# Patient Record
Sex: Female | Born: 1950 | Race: White | Hispanic: No | Marital: Married | Smoking: Never smoker
Health system: Southern US, Community
[De-identification: ages and names within clinical notes are randomized; demographics above are authoritative.]

## PROBLEM LIST (undated history)

## (undated) DIAGNOSIS — G4700 Insomnia, unspecified: Secondary | ICD-10-CM

## (undated) DIAGNOSIS — I776 Arteritis, unspecified: Secondary | ICD-10-CM

## (undated) DIAGNOSIS — K219 Gastro-esophageal reflux disease without esophagitis: Secondary | ICD-10-CM

## (undated) DIAGNOSIS — E079 Disorder of thyroid, unspecified: Secondary | ICD-10-CM

## (undated) DIAGNOSIS — R569 Unspecified convulsions: Secondary | ICD-10-CM

## (undated) DIAGNOSIS — I10 Essential (primary) hypertension: Secondary | ICD-10-CM

## (undated) DIAGNOSIS — A0472 Enterocolitis due to Clostridium difficile, not specified as recurrent: Secondary | ICD-10-CM

## (undated) DIAGNOSIS — E785 Hyperlipidemia, unspecified: Secondary | ICD-10-CM

## (undated) DIAGNOSIS — I4891 Unspecified atrial fibrillation: Secondary | ICD-10-CM

---

## 2021-01-09 ENCOUNTER — Emergency Department: Payer: Medicare HMO

## 2021-01-09 ENCOUNTER — Inpatient Hospital Stay: Payer: Medicare HMO

## 2021-01-09 ENCOUNTER — Inpatient Hospital Stay
Admission: EM | Admit: 2021-01-09 | Discharge: 2021-02-09 | DRG: 871 | Disposition: E | Payer: Medicare HMO | Attending: Pulmonary Disease | Admitting: Pulmonary Disease

## 2021-01-09 ENCOUNTER — Other Ambulatory Visit: Payer: Self-pay

## 2021-01-09 ENCOUNTER — Encounter: Payer: Self-pay | Admitting: Emergency Medicine

## 2021-01-09 DIAGNOSIS — R634 Abnormal weight loss: Secondary | ICD-10-CM | POA: Diagnosis present

## 2021-01-09 DIAGNOSIS — Z9889 Other specified postprocedural states: Secondary | ICD-10-CM

## 2021-01-09 DIAGNOSIS — R0989 Other specified symptoms and signs involving the circulatory and respiratory systems: Secondary | ICD-10-CM | POA: Diagnosis not present

## 2021-01-09 DIAGNOSIS — D649 Anemia, unspecified: Secondary | ICD-10-CM | POA: Diagnosis not present

## 2021-01-09 DIAGNOSIS — E878 Other disorders of electrolyte and fluid balance, not elsewhere classified: Secondary | ICD-10-CM | POA: Diagnosis not present

## 2021-01-09 DIAGNOSIS — R197 Diarrhea, unspecified: Secondary | ICD-10-CM | POA: Diagnosis not present

## 2021-01-09 DIAGNOSIS — E871 Hypo-osmolality and hyponatremia: Secondary | ICD-10-CM | POA: Diagnosis present

## 2021-01-09 DIAGNOSIS — I482 Chronic atrial fibrillation, unspecified: Secondary | ICD-10-CM | POA: Diagnosis present

## 2021-01-09 DIAGNOSIS — G9341 Metabolic encephalopathy: Secondary | ICD-10-CM | POA: Diagnosis present

## 2021-01-09 DIAGNOSIS — E8779 Other fluid overload: Secondary | ICD-10-CM | POA: Diagnosis not present

## 2021-01-09 DIAGNOSIS — Z882 Allergy status to sulfonamides status: Secondary | ICD-10-CM

## 2021-01-09 DIAGNOSIS — D61818 Other pancytopenia: Secondary | ICD-10-CM | POA: Diagnosis not present

## 2021-01-09 DIAGNOSIS — Z20822 Contact with and (suspected) exposure to covid-19: Secondary | ICD-10-CM | POA: Diagnosis present

## 2021-01-09 DIAGNOSIS — R6521 Severe sepsis with septic shock: Secondary | ICD-10-CM | POA: Diagnosis present

## 2021-01-09 DIAGNOSIS — Z79890 Hormone replacement therapy (postmenopausal): Secondary | ICD-10-CM

## 2021-01-09 DIAGNOSIS — A4189 Other specified sepsis: Secondary | ICD-10-CM | POA: Diagnosis not present

## 2021-01-09 DIAGNOSIS — T460X5A Adverse effect of cardiac-stimulant glycosides and drugs of similar action, initial encounter: Secondary | ICD-10-CM | POA: Diagnosis present

## 2021-01-09 DIAGNOSIS — N170 Acute kidney failure with tubular necrosis: Secondary | ICD-10-CM | POA: Diagnosis present

## 2021-01-09 DIAGNOSIS — R52 Pain, unspecified: Secondary | ICD-10-CM | POA: Diagnosis not present

## 2021-01-09 DIAGNOSIS — K449 Diaphragmatic hernia without obstruction or gangrene: Secondary | ICD-10-CM | POA: Diagnosis present

## 2021-01-09 DIAGNOSIS — Z6820 Body mass index (BMI) 20.0-20.9, adult: Secondary | ICD-10-CM

## 2021-01-09 DIAGNOSIS — I959 Hypotension, unspecified: Secondary | ICD-10-CM

## 2021-01-09 DIAGNOSIS — T460X1A Poisoning by cardiac-stimulant glycosides and drugs of similar action, accidental (unintentional), initial encounter: Secondary | ICD-10-CM | POA: Diagnosis not present

## 2021-01-09 DIAGNOSIS — J90 Pleural effusion, not elsewhere classified: Secondary | ICD-10-CM | POA: Diagnosis not present

## 2021-01-09 DIAGNOSIS — J9601 Acute respiratory failure with hypoxia: Secondary | ICD-10-CM | POA: Diagnosis not present

## 2021-01-09 DIAGNOSIS — N08 Glomerular disorders in diseases classified elsewhere: Secondary | ICD-10-CM | POA: Diagnosis present

## 2021-01-09 DIAGNOSIS — Z888 Allergy status to other drugs, medicaments and biological substances status: Secondary | ICD-10-CM

## 2021-01-09 DIAGNOSIS — R571 Hypovolemic shock: Secondary | ICD-10-CM | POA: Diagnosis present

## 2021-01-09 DIAGNOSIS — A419 Sepsis, unspecified organism: Secondary | ICD-10-CM | POA: Diagnosis not present

## 2021-01-09 DIAGNOSIS — N189 Chronic kidney disease, unspecified: Secondary | ICD-10-CM | POA: Diagnosis present

## 2021-01-09 DIAGNOSIS — E039 Hypothyroidism, unspecified: Secondary | ICD-10-CM | POA: Diagnosis present

## 2021-01-09 DIAGNOSIS — R531 Weakness: Secondary | ICD-10-CM | POA: Diagnosis present

## 2021-01-09 DIAGNOSIS — D696 Thrombocytopenia, unspecified: Secondary | ICD-10-CM | POA: Diagnosis not present

## 2021-01-09 DIAGNOSIS — J918 Pleural effusion in other conditions classified elsewhere: Secondary | ICD-10-CM | POA: Diagnosis present

## 2021-01-09 DIAGNOSIS — D709 Neutropenia, unspecified: Secondary | ICD-10-CM | POA: Diagnosis not present

## 2021-01-09 DIAGNOSIS — E162 Hypoglycemia, unspecified: Secondary | ICD-10-CM | POA: Diagnosis present

## 2021-01-09 DIAGNOSIS — K51018 Ulcerative (chronic) pancolitis with other complication: Secondary | ICD-10-CM | POA: Diagnosis not present

## 2021-01-09 DIAGNOSIS — Z515 Encounter for palliative care: Secondary | ICD-10-CM | POA: Diagnosis not present

## 2021-01-09 DIAGNOSIS — A414 Sepsis due to anaerobes: Secondary | ICD-10-CM | POA: Diagnosis present

## 2021-01-09 DIAGNOSIS — K529 Noninfective gastroenteritis and colitis, unspecified: Secondary | ICD-10-CM | POA: Diagnosis not present

## 2021-01-09 DIAGNOSIS — D631 Anemia in chronic kidney disease: Secondary | ICD-10-CM | POA: Diagnosis present

## 2021-01-09 DIAGNOSIS — I4891 Unspecified atrial fibrillation: Secondary | ICD-10-CM | POA: Diagnosis present

## 2021-01-09 DIAGNOSIS — R748 Abnormal levels of other serum enzymes: Secondary | ICD-10-CM | POA: Diagnosis present

## 2021-01-09 DIAGNOSIS — E860 Dehydration: Secondary | ICD-10-CM

## 2021-01-09 DIAGNOSIS — R0602 Shortness of breath: Secondary | ICD-10-CM

## 2021-01-09 DIAGNOSIS — A0471 Enterocolitis due to Clostridium difficile, recurrent: Secondary | ICD-10-CM | POA: Diagnosis present

## 2021-01-09 DIAGNOSIS — I7782 Antineutrophilic cytoplasmic antibody (ANCA) vasculitis: Secondary | ICD-10-CM | POA: Diagnosis not present

## 2021-01-09 DIAGNOSIS — A0472 Enterocolitis due to Clostridium difficile, not specified as recurrent: Secondary | ICD-10-CM | POA: Diagnosis present

## 2021-01-09 DIAGNOSIS — N1832 Chronic kidney disease, stage 3b: Secondary | ICD-10-CM | POA: Diagnosis present

## 2021-01-09 DIAGNOSIS — Z66 Do not resuscitate: Secondary | ICD-10-CM | POA: Diagnosis not present

## 2021-01-09 DIAGNOSIS — E876 Hypokalemia: Secondary | ICD-10-CM | POA: Diagnosis present

## 2021-01-09 DIAGNOSIS — I129 Hypertensive chronic kidney disease with stage 1 through stage 4 chronic kidney disease, or unspecified chronic kidney disease: Secondary | ICD-10-CM | POA: Diagnosis present

## 2021-01-09 DIAGNOSIS — R188 Other ascites: Secondary | ICD-10-CM | POA: Diagnosis present

## 2021-01-09 DIAGNOSIS — J811 Chronic pulmonary edema: Secondary | ICD-10-CM | POA: Diagnosis not present

## 2021-01-09 DIAGNOSIS — Z91011 Allergy to milk products: Secondary | ICD-10-CM

## 2021-01-09 DIAGNOSIS — R0902 Hypoxemia: Secondary | ICD-10-CM | POA: Diagnosis not present

## 2021-01-09 DIAGNOSIS — E785 Hyperlipidemia, unspecified: Secondary | ICD-10-CM | POA: Diagnosis present

## 2021-01-09 DIAGNOSIS — J9602 Acute respiratory failure with hypercapnia: Secondary | ICD-10-CM | POA: Diagnosis not present

## 2021-01-09 DIAGNOSIS — Z79899 Other long term (current) drug therapy: Secondary | ICD-10-CM

## 2021-01-09 DIAGNOSIS — R109 Unspecified abdominal pain: Secondary | ICD-10-CM | POA: Diagnosis not present

## 2021-01-09 DIAGNOSIS — K219 Gastro-esophageal reflux disease without esophagitis: Secondary | ICD-10-CM | POA: Diagnosis present

## 2021-01-09 DIAGNOSIS — Z8619 Personal history of other infectious and parasitic diseases: Secondary | ICD-10-CM

## 2021-01-09 DIAGNOSIS — Z90710 Acquired absence of both cervix and uterus: Secondary | ICD-10-CM

## 2021-01-09 DIAGNOSIS — J189 Pneumonia, unspecified organism: Secondary | ICD-10-CM

## 2021-01-09 DIAGNOSIS — Z8701 Personal history of pneumonia (recurrent): Secondary | ICD-10-CM

## 2021-01-09 HISTORY — DX: Hyperlipidemia, unspecified: E78.5

## 2021-01-09 HISTORY — DX: Gastro-esophageal reflux disease without esophagitis: K21.9

## 2021-01-09 HISTORY — DX: Insomnia, unspecified: G47.00

## 2021-01-09 HISTORY — DX: Arteritis, unspecified: I77.6

## 2021-01-09 HISTORY — DX: Unspecified convulsions: R56.9

## 2021-01-09 HISTORY — DX: Enterocolitis due to Clostridium difficile, not specified as recurrent: A04.72

## 2021-01-09 HISTORY — DX: Unspecified atrial fibrillation: I48.91

## 2021-01-09 HISTORY — DX: Essential (primary) hypertension: I10

## 2021-01-09 HISTORY — DX: Disorder of thyroid, unspecified: E07.9

## 2021-01-09 LAB — CBC WITH DIFFERENTIAL/PLATELET
Abs Immature Granulocytes: 0.01 10*3/uL (ref 0.00–0.07)
Basophils Absolute: 0 10*3/uL (ref 0.0–0.1)
Basophils Relative: 2 %
Eosinophils Absolute: 0 10*3/uL (ref 0.0–0.5)
Eosinophils Relative: 2 %
HCT: 30.1 % — ABNORMAL LOW (ref 36.0–46.0)
Hemoglobin: 10.2 g/dL — ABNORMAL LOW (ref 12.0–15.0)
Immature Granulocytes: 1 %
Lymphocytes Relative: 20 %
Lymphs Abs: 0.2 10*3/uL — ABNORMAL LOW (ref 0.7–4.0)
MCH: 28 pg (ref 26.0–34.0)
MCHC: 33.9 g/dL (ref 30.0–36.0)
MCV: 82.7 fL (ref 80.0–100.0)
Monocytes Absolute: 0.4 10*3/uL (ref 0.1–1.0)
Monocytes Relative: 38 %
Neutro Abs: 0.4 10*3/uL — CL (ref 1.7–7.7)
Neutrophils Relative %: 37 %
Platelets: 179 10*3/uL (ref 150–400)
RBC: 3.64 MIL/uL — ABNORMAL LOW (ref 3.87–5.11)
RDW: 19.5 % — ABNORMAL HIGH (ref 11.5–15.5)
Smear Review: NORMAL
WBC: 1.1 10*3/uL — CL (ref 4.0–10.5)
nRBC: 0 % (ref 0.0–0.2)

## 2021-01-09 LAB — GASTROINTESTINAL PANEL BY PCR, STOOL (REPLACES STOOL CULTURE)

## 2021-01-09 LAB — COMPREHENSIVE METABOLIC PANEL
ALT: 9 U/L (ref 0–44)
AST: 17 U/L (ref 15–41)
Albumin: 2 g/dL — ABNORMAL LOW (ref 3.5–5.0)
Alkaline Phosphatase: 79 U/L (ref 38–126)
Anion gap: 13 (ref 5–15)
BUN: 28 mg/dL — ABNORMAL HIGH (ref 8–23)
CO2: 14 mmol/L — ABNORMAL LOW (ref 22–32)
Calcium: 7.6 mg/dL — ABNORMAL LOW (ref 8.9–10.3)
Chloride: 101 mmol/L (ref 98–111)
Creatinine, Ser: 1.7 mg/dL — ABNORMAL HIGH (ref 0.44–1.00)
GFR, Estimated: 32 mL/min — ABNORMAL LOW (ref >60)
Glucose, Bld: 59 mg/dL — ABNORMAL LOW (ref 70–99)
Potassium: 3.3 mmol/L — ABNORMAL LOW (ref 3.5–5.1)
Sodium: 128 mmol/L — ABNORMAL LOW (ref 135–145)
Total Bilirubin: 1.2 mg/dL (ref 0.3–1.2)
Total Protein: 4.8 g/dL — ABNORMAL LOW (ref 6.5–8.1)

## 2021-01-09 LAB — BLOOD GAS, VENOUS
Acid-Base Excess: 0.6 mmol/L (ref 0.0–2.0)
Bicarbonate: 23.8 mmol/L (ref 20.0–28.0)
FIO2: 0.21
O2 Saturation: 85.9 %
Patient temperature: 37
pCO2, Ven: 32 mmHg — ABNORMAL LOW (ref 44.0–60.0)
pH, Ven: 7.48 — ABNORMAL HIGH (ref 7.250–7.430)
pO2, Ven: 47 mmHg — ABNORMAL HIGH (ref 32.0–45.0)

## 2021-01-09 LAB — C DIFFICILE QUICK SCREEN W PCR REFLEX
C Diff antigen: POSITIVE — AB
C Diff interpretation: DETECTED
C Diff toxin: POSITIVE — AB

## 2021-01-09 LAB — RESP PANEL BY RT-PCR (FLU A&B, COVID) ARPGX2
Influenza A by PCR: NEGATIVE
Influenza B by PCR: NEGATIVE
SARS Coronavirus 2 by RT PCR: NEGATIVE

## 2021-01-09 LAB — HEMOGLOBIN AND HEMATOCRIT, BLOOD
HCT: 26.2 % — ABNORMAL LOW (ref 36.0–46.0)
Hemoglobin: 9.3 g/dL — ABNORMAL LOW (ref 12.0–15.0)

## 2021-01-09 LAB — PROCALCITONIN: Procalcitonin: 94.99 ng/mL

## 2021-01-09 LAB — BLOOD GAS, ARTERIAL
Acid-base deficit: 9.3 mmol/L — ABNORMAL HIGH (ref 0.0–2.0)
Bicarbonate: 13.3 mmol/L — ABNORMAL LOW (ref 20.0–28.0)
FIO2: 0.21
O2 Saturation: 95.6 %
Patient temperature: 37
pCO2 arterial: 20 mmHg — ABNORMAL LOW (ref 32.0–48.0)
pH, Arterial: 7.43 (ref 7.350–7.450)
pO2, Arterial: 77 mmHg — ABNORMAL LOW (ref 83.0–108.0)

## 2021-01-09 LAB — CBG MONITORING, ED: Glucose-Capillary: 68 mg/dL — ABNORMAL LOW (ref 70–99)

## 2021-01-09 LAB — TSH: TSH: 1.872 u[IU]/mL (ref 0.350–4.500)

## 2021-01-09 LAB — PROTIME-INR
INR: 1.2 (ref 0.8–1.2)
Prothrombin Time: 15.3 seconds — ABNORMAL HIGH (ref 11.4–15.2)

## 2021-01-09 LAB — MAGNESIUM: Magnesium: 2.4 mg/dL (ref 1.7–2.4)

## 2021-01-09 LAB — LACTIC ACID, PLASMA
Lactic Acid, Venous: 2.5 mmol/L (ref 0.5–1.9)
Lactic Acid, Venous: 4.4 mmol/L (ref 0.5–1.9)
Lactic Acid, Venous: 6.4 mmol/L (ref 0.5–1.9)

## 2021-01-09 LAB — PHOSPHORUS: Phosphorus: 3.9 mg/dL (ref 2.5–4.6)

## 2021-01-09 LAB — AMYLASE: Amylase: 13 U/L — ABNORMAL LOW (ref 28–100)

## 2021-01-09 LAB — APTT: aPTT: 34 seconds (ref 24–36)

## 2021-01-09 LAB — MRSA NEXT GEN BY PCR, NASAL: MRSA by PCR Next Gen: NOT DETECTED

## 2021-01-09 LAB — LIPASE, BLOOD: Lipase: 19 U/L (ref 11–51)

## 2021-01-09 LAB — GLUCOSE, CAPILLARY: Glucose-Capillary: 91 mg/dL (ref 70–99)

## 2021-01-09 IMAGING — CT CT HEAD CODE STROKE
3 series · 16 of 47 positions shown, 19 images · non-contrast
Comparison: None.

CLINICAL DATA: Code stroke.

EXAM:
CT HEAD WITHOUT CONTRAST
TECHNIQUE: Contiguous axial images were obtained from the base of the skull
through the vertex without intravenous contrast.

[Series 4: head wo · axial · 0.42mm/px · z∈[-56,+74]mm · 10 of 32 slices shown, 13 images]
[im 3/32  brain]
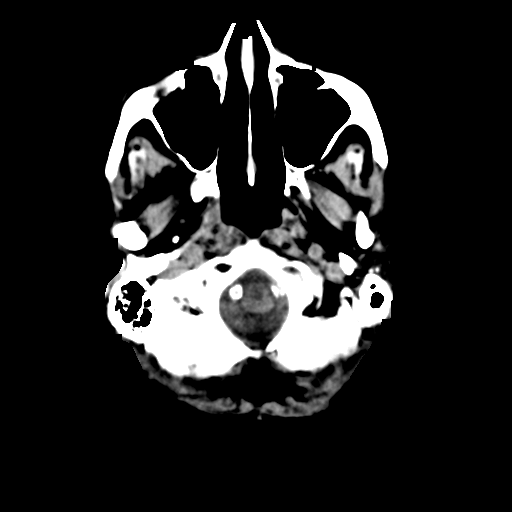
[im 3/32  bone]
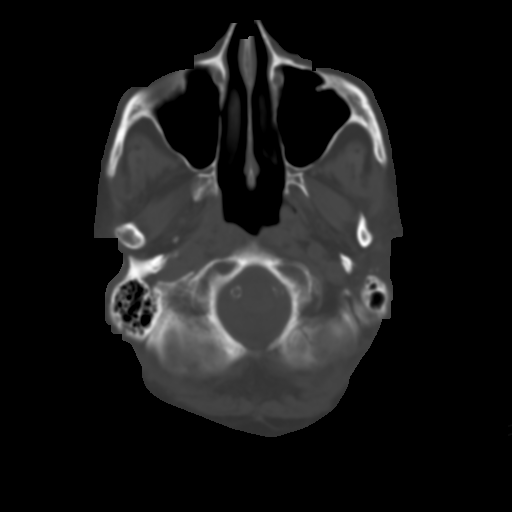
[im 6/32  brain]
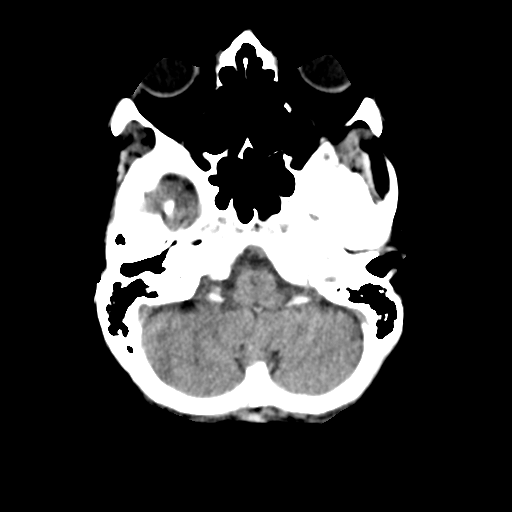
[im 9/32  brain]
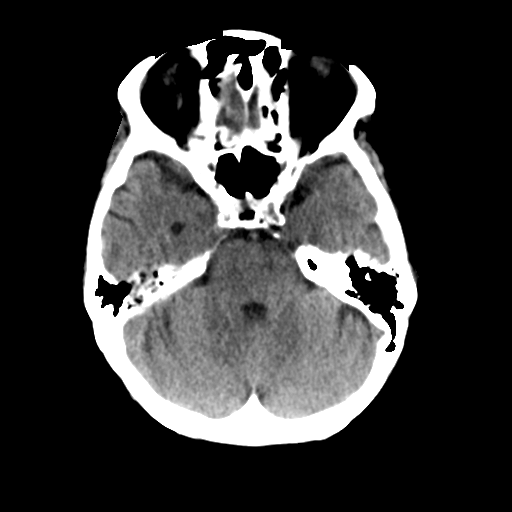
[im 11/32  brain]
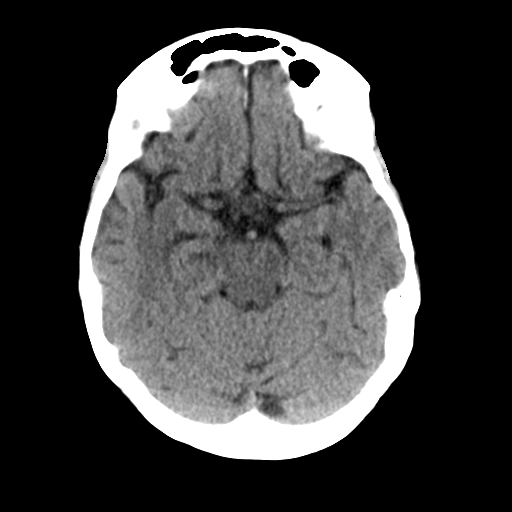
[im 14/32  brain]
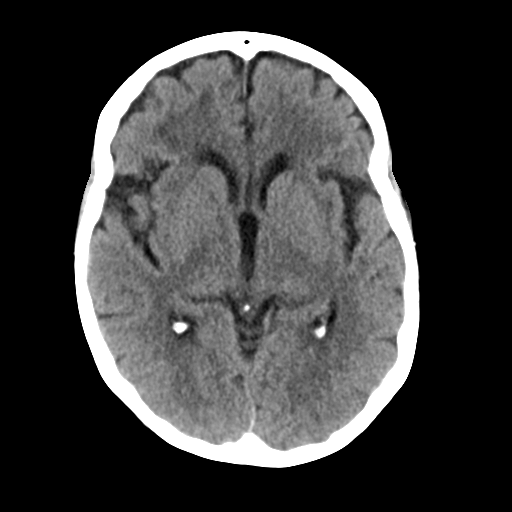
[im 14/32  bone]
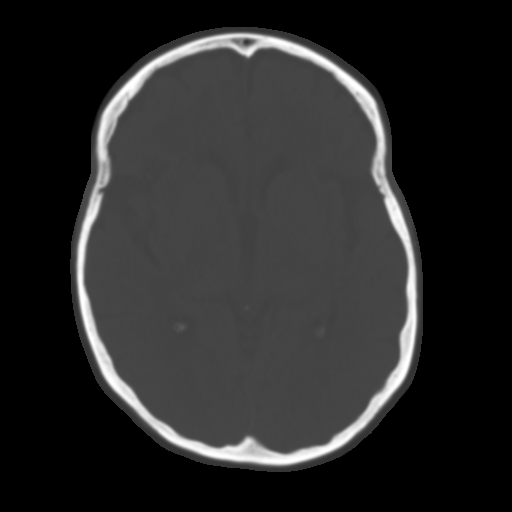
[im 18/32  brain]
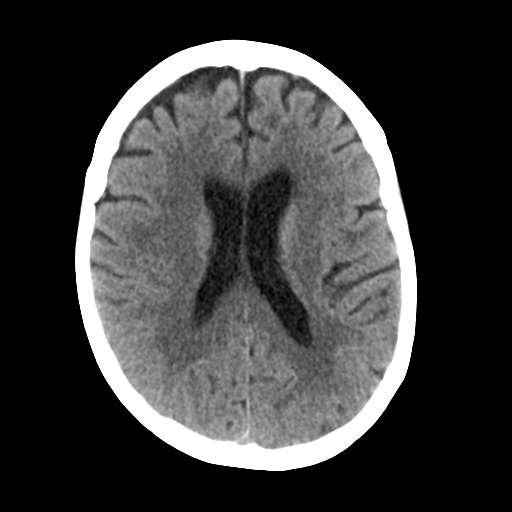
[im 21/32  brain]
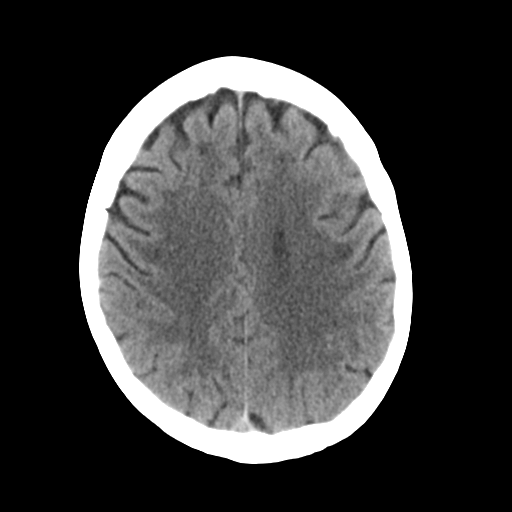
[im 24/32  brain]
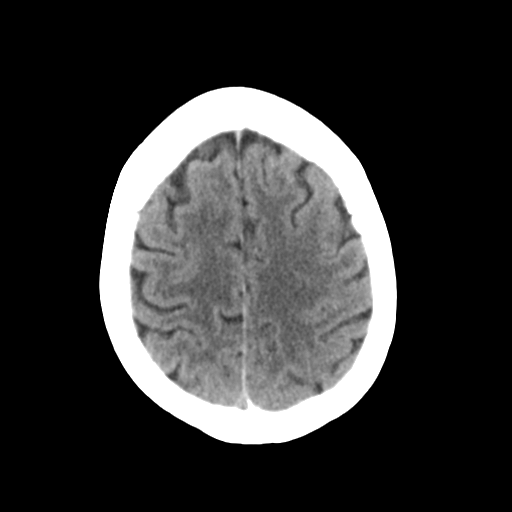
[im 26/32  brain]
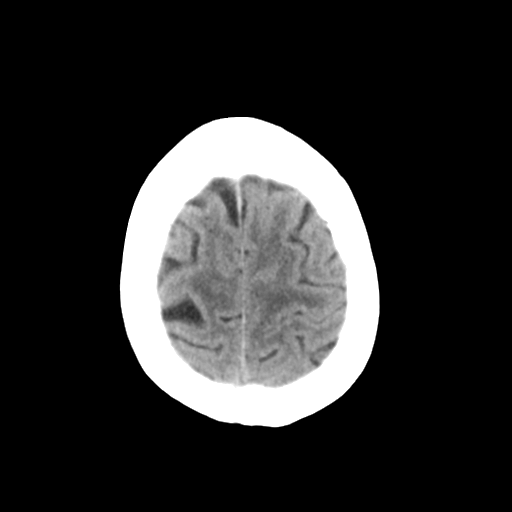
[im 26/32  bone]
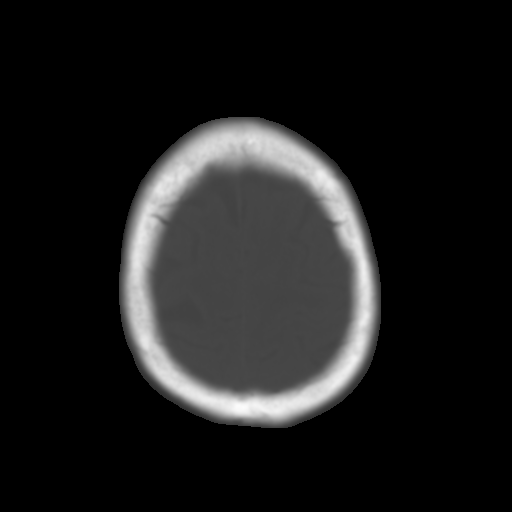
[im 29/32  brain]
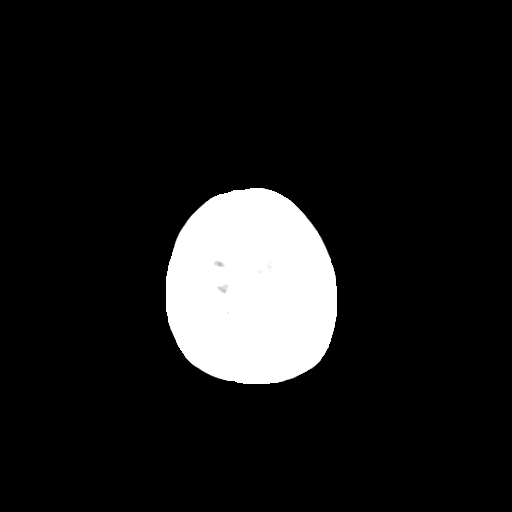

[Series 5: coronal soft tissue · coronal · 0.33mm/px · 3 of 65 slices shown]
[im 22/65  brain]
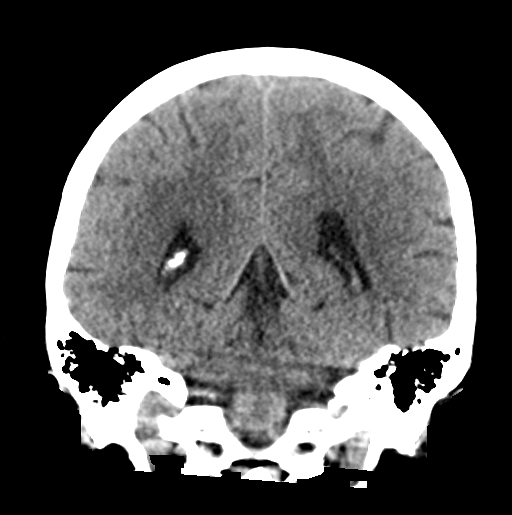
[im 29/65  brain]
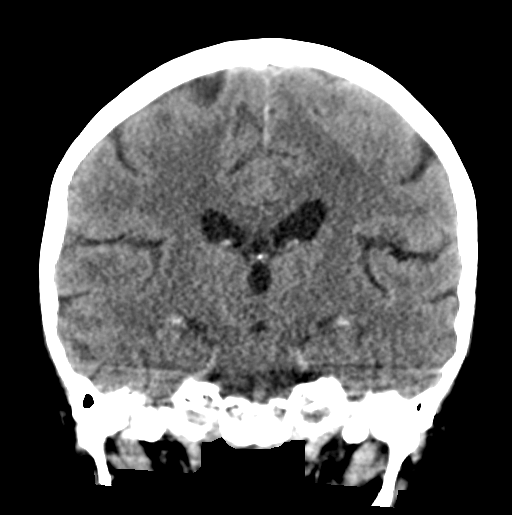
[im 36/65  brain]
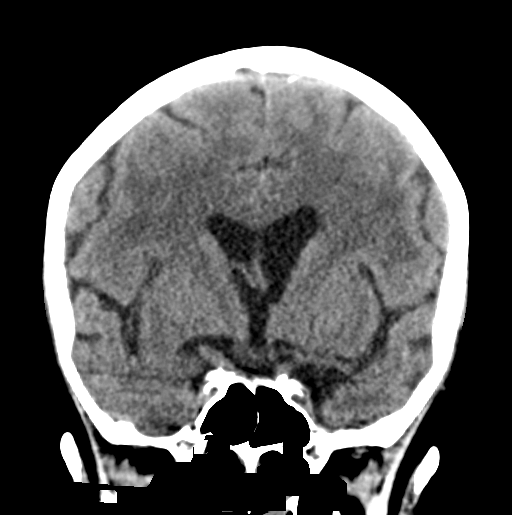

[Series 6: sagittal soft tissue · sagittal · 0.33mm/px · 3 of 57 slices shown]
[im 19/57  brain]
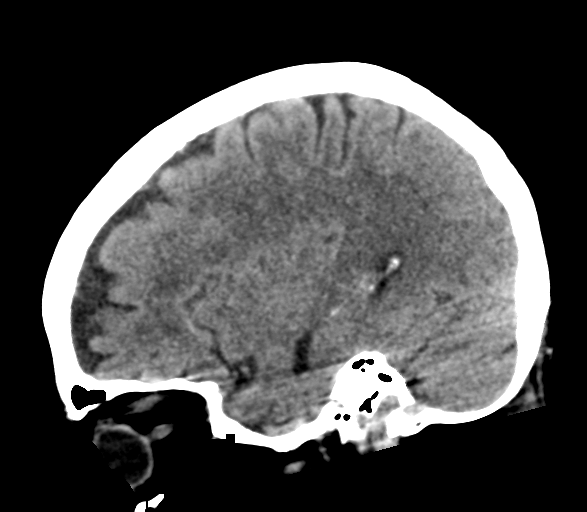
[im 29/57  brain]
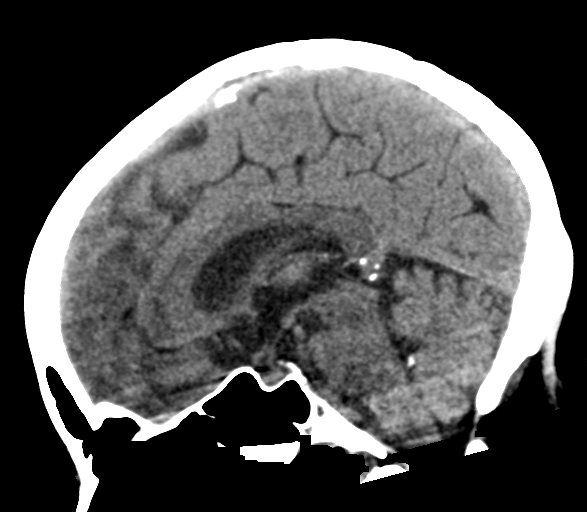
[im 38/57  brain]
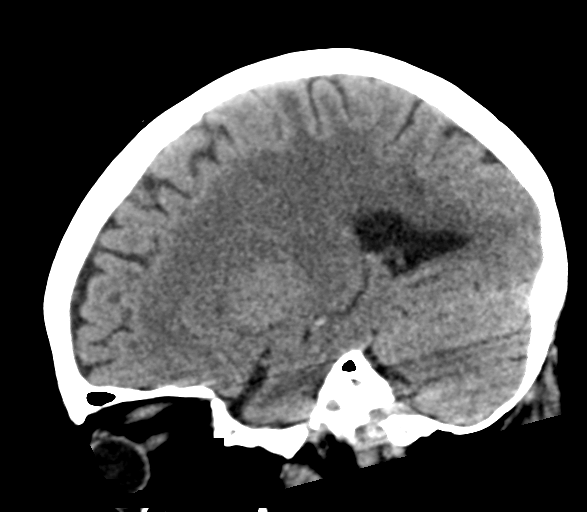

[16 of 47 positions shown; findings below may reference images not displayed]

FINDINGS: Brain: There is no acute intracranial hemorrhage, mass effect, or
edema. Gray-white differentiation is preserved. Ventricles and sulci
are normal in size and configuration. Patchy low-density in the
supratentorial white matter is nonspecific but may reflect chronic
microvascular ischemic changes. No extra-axial collection.

Vascular: No hyperdense vessel. There is intracranial
atherosclerotic calcification at the skull base.

Skull: Unremarkable.

Sinuses/Orbits: No acute or significant abnormality.

Other: Mastoid air cells are clear.

ASPECTS (Alberta Stroke Program Early CT Score)

- Ganglionic level infarction (caudate, lentiform nuclei, internal
capsule, insula, M1-M3 cortex): 7

- Supraganglionic infarction (M4-M6 cortex): 3

Total score (0-10 with 10 being normal): 10
IMPRESSION: There is no acute intracranial hemorrhage or evidence of acute
infarction. ASPECT score is 10.

Chronic microvascular ischemic changes.

These results were called by telephone at the time of interpretation
on [DATE] at [DATE] to provider FRANCILANE , who verbally
acknowledged these results.

## 2021-01-09 MED ORDER — FENTANYL CITRATE PF 50 MCG/ML IJ SOSY
12.5000 ug | PREFILLED_SYRINGE | Freq: Once | INTRAMUSCULAR | Status: AC
  Administered 2021-01-09: 12.5 ug via INTRAVENOUS

## 2021-01-09 MED ORDER — PIPERACILLIN-TAZOBACTAM 3.375 G IVPB
3.3750 g | Freq: Three times a day (TID) | INTRAVENOUS | Status: DC
  Administered 2021-01-09: 3.375 g via INTRAVENOUS
  Filled 2021-01-09: qty 50

## 2021-01-09 MED ORDER — VANCOMYCIN HCL IN DEXTROSE 1-5 GM/200ML-% IV SOLN
1000.0000 mg | Freq: Once | INTRAVENOUS | Status: AC
  Administered 2021-01-09: 1000 mg via INTRAVENOUS
  Filled 2021-01-09: qty 200

## 2021-01-09 MED ORDER — METRONIDAZOLE 500 MG/100ML IV SOLN
500.0000 mg | Freq: Three times a day (TID) | INTRAVENOUS | Status: DC
  Filled 2021-01-09 (×2): qty 100

## 2021-01-09 MED ORDER — LACTATED RINGERS IV SOLN: INTRAVENOUS | Status: AC

## 2021-01-09 MED ORDER — SODIUM CHLORIDE 0.9 % IV SOLN
2.0000 g | Freq: Once | INTRAVENOUS | Status: AC
  Administered 2021-01-09: 2 g via INTRAVENOUS
  Filled 2021-01-09: qty 2

## 2021-01-09 MED ORDER — POTASSIUM CHLORIDE CRYS ER 20 MEQ PO TBCR
40.0000 meq | EXTENDED_RELEASE_TABLET | ORAL | Status: DC
  Filled 2021-01-09: qty 2

## 2021-01-09 MED ORDER — SODIUM CHLORIDE 0.9% FLUSH
3.0000 mL | Freq: Once | INTRAVENOUS | Status: AC
Start: 2021-01-09 — End: 2021-01-09
  Administered 2021-01-09: 3 mL via INTRAVENOUS

## 2021-01-09 MED ORDER — METRONIDAZOLE 500 MG/100ML IV SOLN: 500.0000 mg | Freq: Three times a day (TID) | INTRAVENOUS | Status: DC

## 2021-01-09 MED ORDER — AMIODARONE LOAD VIA INFUSION
150.0000 mg | Freq: Once | INTRAVENOUS | Status: AC
  Administered 2021-01-09: 150 mg via INTRAVENOUS
  Filled 2021-01-09: qty 83.34

## 2021-01-09 MED ORDER — LORAZEPAM 2 MG/ML IJ SOLN
1.0000 mg | Freq: Once | INTRAMUSCULAR | Status: AC
  Administered 2021-01-09: 1 mg via INTRAVENOUS
  Filled 2021-01-09: qty 1

## 2021-01-09 MED ORDER — DILTIAZEM HCL 25 MG/5ML IV SOLN
10.0000 mg | Freq: Once | INTRAVENOUS | Status: AC
  Administered 2021-01-09: 10 mg via INTRAVENOUS
  Filled 2021-01-09: qty 5

## 2021-01-09 MED ORDER — NOREPINEPHRINE 16 MG/250ML-% IV SOLN
0.0000 ug/min | INTRAVENOUS | Status: DC
  Administered 2021-01-10: 2 ug/min via INTRAVENOUS
  Filled 2021-01-09: qty 250

## 2021-01-09 MED ORDER — FENTANYL CITRATE PF 50 MCG/ML IJ SOSY
PREFILLED_SYRINGE | INTRAMUSCULAR | Status: AC
  Administered 2021-01-09: 50 ug
  Filled 2021-01-09: qty 1

## 2021-01-09 MED ORDER — VANCOMYCIN HCL 125 MG PO CAPS: 125.0000 mg | ORAL_CAPSULE | ORAL | Status: DC

## 2021-01-09 MED ORDER — NOREPINEPHRINE 4 MG/250ML-% IV SOLN
2.0000 ug/min | INTRAVENOUS | Status: DC
Start: 2021-01-09 — End: 2021-01-09
  Filled 2021-01-09: qty 250

## 2021-01-09 MED ORDER — LACTATED RINGERS IV BOLUS
1000.0000 mL | Freq: Once | INTRAVENOUS | Status: AC
  Administered 2021-01-09: 1000 mL via INTRAVENOUS

## 2021-01-09 MED ORDER — FENTANYL CITRATE PF 50 MCG/ML IJ SOSY
25.0000 ug | PREFILLED_SYRINGE | INTRAMUSCULAR | Status: DC | PRN
  Administered 2021-01-09 – 2021-01-17 (×24): 25 ug via INTRAVENOUS
  Filled 2021-01-09 (×22): qty 1

## 2021-01-09 MED ORDER — SODIUM CHLORIDE 0.9 % IV BOLUS
1000.0000 mL | Freq: Once | INTRAVENOUS | Status: AC
  Administered 2021-01-09: 1000 mL via INTRAVENOUS

## 2021-01-09 MED ORDER — VANCOMYCIN HCL 125 MG PO CAPS
125.0000 mg | ORAL_CAPSULE | Freq: Every day | ORAL | Status: DC
Start: 2021-01-31 — End: 2021-01-09

## 2021-01-09 MED ORDER — SODIUM BICARBONATE 8.4 % IV SOLN
100.0000 meq | Freq: Once | INTRAVENOUS | Status: AC
  Administered 2021-01-09: 100 meq via INTRAVENOUS
  Filled 2021-01-09: qty 100

## 2021-01-09 MED ORDER — METRONIDAZOLE 500 MG/100ML IV SOLN
500.0000 mg | Freq: Once | INTRAVENOUS | Status: DC
  Administered 2021-01-09: 500 mg via INTRAVENOUS
  Filled 2021-01-09: qty 100

## 2021-01-09 MED ORDER — SODIUM BICARBONATE 8.4 % IV SOLN
INTRAVENOUS | Status: DC
  Filled 2021-01-09: qty 1000
  Filled 2021-01-09: qty 150
  Filled 2021-01-09: qty 1000

## 2021-01-09 MED ORDER — AMIODARONE HCL IN DEXTROSE 360-4.14 MG/200ML-% IV SOLN
30.0000 mg/h | INTRAVENOUS | Status: DC
  Administered 2021-01-09 – 2021-01-11 (×6): 60 mg/h via INTRAVENOUS
  Administered 2021-01-11 – 2021-01-13 (×5): 30 mg/h via INTRAVENOUS
  Filled 2021-01-09 (×11): qty 200

## 2021-01-09 MED ORDER — VANCOMYCIN HCL 250 MG PO CAPS
500.0000 mg | ORAL_CAPSULE | Freq: Four times a day (QID) | ORAL | Status: DC
Start: 2021-01-09 — End: 2021-01-16
  Administered 2021-01-10 – 2021-01-15 (×25): 500 mg via ORAL
  Filled 2021-01-09 (×29): qty 2

## 2021-01-09 MED ORDER — CIPROFLOXACIN IN D5W 400 MG/200ML IV SOLN: 400.0000 mg | INTRAVENOUS | Status: DC

## 2021-01-09 MED ORDER — VANCOMYCIN HCL 125 MG PO CAPS
125.0000 mg | ORAL_CAPSULE | ORAL | Status: DC
Start: 2021-03-07 — End: 2021-01-09

## 2021-01-09 MED ORDER — METRONIDAZOLE 500 MG/100ML IV SOLN
500.0000 mg | Freq: Three times a day (TID) | INTRAVENOUS | Status: DC
Start: 2021-01-09 — End: 2021-01-16
  Administered 2021-01-09 – 2021-01-16 (×20): 500 mg via INTRAVENOUS
  Filled 2021-01-09 (×22): qty 100

## 2021-01-09 MED ORDER — SODIUM BICARBONATE 8.4 % IV SOLN
150.0000 meq | Freq: Once | INTRAVENOUS | Status: AC
  Administered 2021-01-09: 150 meq via INTRAVENOUS
  Filled 2021-01-09: qty 50

## 2021-01-09 MED ORDER — SODIUM CHLORIDE 0.9 % IV SOLN: 250.0000 mL | INTRAVENOUS | Status: DC

## 2021-01-09 MED ORDER — AMIODARONE HCL IN DEXTROSE 360-4.14 MG/200ML-% IV SOLN
60.0000 mg/h | INTRAVENOUS | Status: AC
  Administered 2021-01-09 (×2): 60 mg/h via INTRAVENOUS
  Filled 2021-01-09 (×3): qty 200

## 2021-01-09 MED ORDER — VANCOMYCIN HCL 125 MG PO CAPS: 125.0000 mg | ORAL_CAPSULE | Freq: Four times a day (QID) | ORAL | Status: DC

## 2021-01-09 MED ORDER — LACTATED RINGERS IV BOLUS
500.0000 mL | Freq: Once | INTRAVENOUS | Status: AC
  Administered 2021-01-09: 500 mL via INTRAVENOUS

## 2021-01-09 MED ORDER — ONDANSETRON HCL 4 MG/2ML IJ SOLN
4.0000 mg | Freq: Four times a day (QID) | INTRAMUSCULAR | Status: DC | PRN
  Administered 2021-01-15: 4 mg via INTRAVENOUS
  Filled 2021-01-09: qty 2

## 2021-01-09 MED ORDER — VANCOMYCIN HCL 125 MG PO CAPS: 125.0000 mg | ORAL_CAPSULE | Freq: Two times a day (BID) | ORAL | Status: DC

## 2021-01-09 MED ORDER — DEXTROSE 50 % IV SOLN
1.0000 | Freq: Once | INTRAVENOUS | Status: AC
Start: 2021-01-09 — End: 2021-01-15
  Filled 2021-01-09: qty 50

## 2021-01-09 NOTE — ED Notes (Addendum)
Called lab after multiple attempts to obtain blood draw.

## 2021-01-09 NOTE — Progress Notes (Signed)
Per ICU RN, trying to draw lactic acid, placing a central line

## 2021-01-09 NOTE — Progress Notes (Signed)
Notified bedside nurse of need to draw repeat lactic acid   (ICU RN) .  

## 2021-01-09 NOTE — Consult Note (Addendum)
NEURO HOSPITALIST CONSULT NOTE   Requesting physician: Dr. Kerman Passey  Reason for Consult: Code Stroke  History obtained from:  EMS, Patient and Chart     HPI:                                                                                                                                          Lauren Bradshaw is an 70 y.o. female recently treated for C. Difficile colitis, currently with diarrhea requiring new use of Depends, as well as malaise, who presents to the ED as a Code Stroke after right grip strength weakness and slurred speech were noted by EMS. EMS was initially called to the home after family called [facility] to discuss readmission for worsening malaise, lethargy and dehydration at home after having diarrhea at home all weekend long. Diffuse weakness manifested over the weekend, initially able to transfer to/from wheelchair Friday, then by Saturday she was a 2-person transfer requiring family to pick her up and by Sunday she was too weak to turn over in bed/couch on her own. CBG on scene was 54. After IV dextrose was administered by EMS, their glucometer registered a normalized blood glucose level. However, EMS felt that her slurred speech and right arm grip weakness had not improved with the dextrose and a Code Stroke was called in the field. In the ED, the patient's CBG was 74.  Of note, the patient's Eliquis was recently discontinued on (10/28) due to patient with anemia and unknown source of bleeding, per a discharge note available from [facility] (10/28).   PNA and acute respiratory failure in August. 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 She had seizures (at least 2 per family) while at [facility], which was suspected to be from one of the medications she was given for the vasculitis (family does not know the name of the medication, but it was not Rituxan and after it was stopped her seizures stopped).   LKN:  0730  PMHx Recent C. 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            Social History: No EtOH or tobacco  Medications: Tylenol PRN Norvasc 10 mg AM Lipitor 40 mg HS Tums TID Zyrtec 10 QAM Vitamin D Iron supplement (refusing  to take) Levothyroxine Magnesium oxide Melatonin Toprol XL 50 qAM Singulair Zofran PRN Protonix 40 mg BID Klor-con 20 meq qAM   ROS:                                                                                                                                       Denies headache or limb weakness. States that her abdomen is painful.    Blood pressure 106/69, pulse (!) 136, resp. rate (!) 24, weight 54.7 kg, SpO2 98 %.   General Examination:                                                                                                       Physical Exam  HEENT -  /AT. Dry oral mucosa.  Lungs - Respirations unlabored Ext: No edema   Neurological Examination Mental Status: Awake with decreased level of alertness. Speech is fluent with intact naming and comprehension. Able to follow all commands. Appears fatigued. . Cranial Nerves: II: Visual fields grossly normal. Pupils equal.  III,IV, VI: Lids are half shut through most of exam. EOMI.  V: FT sensation equal bilaterally VII: No facial droop.  VIII: hearing intact to voice IX,X: No hypophonia or hoarseness XI:  Symmetric XII: Midline tongue extension Motor: Diffuse weakness.  BUE 4+/5 proximally and distally BLE: Refuses to elevate at hips due to malaise and fatigue. Knee extension 4-/5 bilaterally with diminished effort. ADF and APF 4/5 bilaterally  Sensory: FT intact x 4. No extinction to DSS.  Plantars: Upgoing bilaterally versus withdrawal to noxious Cerebellar: No ataxia with FNF bilaterally. Unable to perform H-S.  Gait: Unable to assess   Lab Results: Basic Metabolic Panel: No results for input(s): NA, K, CL, CO2, GLUCOSE, BUN, CREATININE, CALCIUM, MG, PHOS in the last 168 hours.  CBC: No results for input(s): WBC, NEUTROABS, HGB, HCT, MCV, PLT in the last 168 hours.  Cardiac Enzymes: No results for input(s): CKTOTAL, CKMB, CKMBINDEX, TROPONINI in the last 168 hours.  Lipid Panel: No results for input(s): CHOL, TRIG, HDL, CHOLHDL, VLDL, LDLCALC in the last 168 hours.  Imaging: CT HEAD CODE STROKE WO CONTRAST  Result Date: 01/04/2021 CLINICAL DATA:  Code stroke. EXAM: CT HEAD WITHOUT CONTRAST TECHNIQUE: Contiguous axial images were obtained from the base of the skull through the vertex without intravenous contrast. COMPARISON:  None. FINDINGS: Brain: There is no acute intracranial hemorrhage, mass effect, or edema. Gray-white differentiation is preserved. Ventricles and sulci are normal in size and configuration. Patchy  low-density in the supratentorial white matter is nonspecific but may reflect chronic microvascular ischemic changes. No extra-axial collection. Vascular: No hyperdense vessel. There is intracranial atherosclerotic calcification at the skull base. Skull: Unremarkable. Sinuses/Orbits: No acute or significant abnormality. Other: Mastoid air cells are clear. ASPECTS (Alberta Stroke Program Early CT Score) - Ganglionic level infarction (caudate, lentiform nuclei, internal capsule, insula, M1-M3 cortex): 7 - Supraganglionic infarction (M4-M6 cortex): 3 Total score (0-10 with 10  being normal): 10 IMPRESSION: There is no acute intracranial hemorrhage or evidence of acute infarction. ASPECT score is 10. Chronic microvascular ischemic changes. These results were called by telephone at the time of interpretation on 30-Jan-2021 at 10:10 am to provider [facility] , who verbally acknowledged these results. Electronically Signed   By: Guadlupe Spanish M.D.   On: 2021/01/30 10:11     Assessment: 70 year old female presenting with malaise and diffuse weakness in the setting of recent C. Diff colitis. She denies focal weakness. Code Stroke was called after EMS noted right arm drift and slurred speech.  1. Exam is nonfocal. She has diffuse weakness, worse in her legs bilaterally. There is significant effort-dependence on her exam in the context of her malaise.  2. CT head: There is no acute intracranial hemorrhage or evidence of acute infarction. ASPECT score is 10. Chronic microvascular ischemic changes. 3. Atrial fibrillation, on Eliquis.  4. Diarrhea, severe. Per family required q2-3h diaper changes all weekend.  5. DDx of concomitant diarrhea, recent pneumonia in the setting of known ANCA vasculitis affecting the kidneys. May be a multisystem vasculitis. Brain could be affected in that event.  6. EKG: Atrial fibrillation, Left anterior fascicular block, Baseline wander in lead(s) V4 7. Now back in atrial fibrillation - will need Cardiology consult   Recommendations: 1. MRI brain without contrast to rule out acute cerebral vasculitic lesions versus small cardioembolic stroke (ordered).  2. Management of dehydration per primary team 3. Diagnosis/management of diarrhea per primary team (DDx vasculitis versus recurrence of C. Diff).  4. Cardiology consult 5. Not a TNK or thrombectomy candidate due to nonfocal exam.  6. Full lab panel.  7. MRI thoracic spine to further assess BLE weakness and upgoing toes (ordered)   Electronically signed: Dr. Caryl Pina 30-Jan-2021, 10:17  AM

## 2021-01-09 NOTE — Consult Note (Signed)
NAME:  Lauren Bradshaw, MRN:  [id number], DOB:  02-Sep-1950, LOS: 0 ADMISSION DATE:  12/29/2020 CONSULTATION DATE:  12/30/2020  REFERRING MD:  Dr. Lenard Lance CHIEF COMPLAINT:  Weakness, lethargy and abdominal pain    BRIEF SYNOPSIS:  Lauren Bradshaw is a 70 y/o F who presented to [facility] ED for weakness, lethargy, diarrhea, and abdominal pain after recent d/c from rehab for c.diff, Afib, PNA, and ANCA renal vasculitis. Patient placed in ICU care for hemodynamic instability secondary to hypovolemic septic shock d/t pancolitis with multiorgan failure with resp distress, renal failure, cardiac failure with afib, severe leukopenia with severe metabolic acidosis and encephalopathy  History of Present Illness:  Lauren Bradshaw is a 70 year old female with a history of atrial fibrillation, c. Diff., HTN, HDL, and vasculitis who presents to [facility] ED with complaints of progressive weakness and lethargy. She was recently discharged from [facility] rehab after a long stint of being in and out of hospitals since August 1st for treatment for PNA with respiratory failure (did not require intubation), AFIB (currently not on Eliquis d/t decreased hemoglobin), ANCA renal vasculitis (placed on chemo meds, had seizure), and C. Diff. (She was meant to FU with both renal and pulmonary specialists this week to determine further plan of care.)  She was discharged to her son Lauren Bradshaw) and daughter-in-law's Lauren Bradshaw) home able to pivot transfer and was doing quite well. She was reported to have formed stools upon discharge. However, over the weekend, she developed severe diarrhea, abdominal pain, and progressive weakness and lethargy. She had decreased oral intake of both food and water. Patient was unable to get out of bed, EMS was called. She was transferred to [facility] and not back to [facility] d/t positive stroke screen for slurred speech. Code stroke was activated.   Pertinent  Medical History  Atrial fibrillation  C. Diff HTN HDL ANCA renal vasculitis  PNA w/  respiratory failure   Significant Hospital Events: Including procedures, antibiotic start and stop dates in addition to other pertinent events   ED course: Code stroke activated, CTH obtained, but further testing deferred d/t hemodynamic instability. Patient was given amiodarone bolus for AFIB w/ RVR.   10/31>> The critical care team was consulted for hemodynamic instability. Patient was seen in ED bed, appears to be critically ill - complaining of abdominal pain, CT abdomen/pelvis ordered. Will be transferred to critical care for further workup and treatment.    Micro Data:  10/31>> Blood culture pending  10/31>> Stool PCR pending   Antimicrobials:  10/31>> metronidazole, vancomycin, cefepime   Interim History / Subjective:  The critical care team was consulted for hemodynamic instability. Patient was seen in ED bed, appears to be critically ill - complaining of abdominal pain, CT abdomen/pelvis ordered. Will be transferred to critical care for further workup and treatment.   Objective   Blood pressure (!) 106/57, pulse (!) 124, temperature (!) 97.5 F (36.4 C), temperature source Oral, resp. rate (!) 32, height 5\' 4"  (1.626 m), weight 54.7 kg, SpO2 97 %.        Intake/Output Summary (Last 24 hours) at 12/10/2020 1642 Last data filed at 01/03/2021 1639 Gross per 24 hour  Intake 2500.67 ml  Output --  Net 2500.67 ml   Filed Weights   01/03/2021 1013  Weight: 54.7 kg    REVIEW OF SYSTEMS Review of Systems  Constitutional:  Positive for malaise/fatigue and weight loss (20 lb weight loss since 9/21).  Respiratory:  Positive for cough. Negative for shortness of breath.  Cardiovascular:  Negative for chest pain.  Gastrointestinal:  Positive for abdominal pain, diarrhea and nausea. Negative for blood in stool and vomiting.  Neurological:  Negative for headaches.  Endo/Heme/Allergies:  Bruises/bleeds easily.    PHYSICAL EXAMINATION:  GENERAL: critically ill appearing, pallor.   EYES: Pupils equal, round, reactive to light. No scleral icterus. 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  PULMONARY: Tachypnea. Symmetric chest wall movement. Coarse lung sounds R upper lobe. Good airflow.  CARDIOVASCULAR: Atrial fibrillation w/ RVR. No MRG. GASTROINTESTINAL: Severe tenderness to palpation and percussion. Moderately distended. Positive bowel sounds.  MUSCULOSKELETAL: 1+ pitting edema BLE. NEUROLOGIC: A&Ox4. Generalized weakness, but no focal neurologic deficits.  SKIN: Diffuse petechia BUE, minimal petechia BLE.   Labs/imaging that I havepersonally reviewed  (right click and "Reselect all SmartList Selections" daily)  10/31 >> CXR- New airspace opacity in the right suprahilar lung likely due to pneumonia. Radiographic follow-up to resolution is recommended to exclude developing mass. 10/31 >> CT abdomen/pelvis - pancolitis. Left renal subscapular fluid collection, likely hematoma d/t recent renal biopsy.   Resolved Hospital Problem list     ASSESSMENT AND PLAN  SYNOPSIS: Lauren Bradshaw is a 70 y/o F who presented to [facility] ED for weakness, lethargy, diarrhea, and abdominal pain after recent d/c from rehab for c.diff, Afib, PNA, and ANCA renal vasculitis. Patient placed in ICU care for hemodynamic instability secondary to hypovolemic septic shock d/t pancolitis with multiorgan failure with resp distress, renal failure, cardiac failure with afib, hypoglycemia, severe leukopenia with severe metabolic acidosis and encephalopathy  #Hypovolemic shock  #SIRS + Source of infection = sepsis  #Septic shock #Metabolic acidosis with respiratory compensation  - Source: recent c.diff infection vs. Intraabdominal abscess  - Aggressive fluid resuscitation-  LR bolus  - Bicarb infusion  - Sepsis antimicrobials (empiric): metronidazole, vancomycin, cefepime  - Start Zosyn  - use vasopressors as needed to keep MAP>65  - Trend CBC, procal, lactate, and VBGs;  follow up cultures   #Abdominal pain  - Extreme tenderness to palpation and percussion  - CT abdomen/pelvis w/ contrast -pan colitis, renal hematoma, no abscess, no perforation - Consult surgery/IR - possible surgical abdomen  - Zofran as needed   #Electrolyte derangement  #Hyponatremia  #Hypokalemia  - Trend labs  - Pharmacy consultation , replace as needed - LR + sodium bicarb for hyponatremia sufficient    #Leukopenia  #Immunosuppression  #Acute anemia  - Trend WBCs  - Leukopenia could be due to recent chemo meds  - Anemia may be d/t intraabdominal hematoma  - Neutropenic precautions  #ACUTE KIDNEY INJURY/Renal Failure -continue Foley Catheter-assess need -Avoid nephrotoxic agents -Follow urine output, BMP -Ensure adequate renal perfusion, optimize oxygenation -Renal dose medications   Intake/Output Summary (Last 24 hours) at 12/24/2020 1657 Last data filed at 12/16/2020 1639 Gross per 24 hour  Intake 2500.67 ml  Output --  Net 2500.67 ml   BMP Latest Ref Rng & Units 12/17/2020  Glucose 70 - 99 mg/dL 59(L)  BUN 8 - 23 mg/dL 28(H)  Creatinine 0.44 - 1.00 mg/dL 1.70(H)  Sodium 135 - 145 mmol/L 128(L)  Potassium 3.5 - 5.1 mmol/L 3.3(L)  Chloride 98 - 111 mmol/L 101  CO2 22 - 32 mmol/L 14(L)  Calcium 8.9 - 10.3 mg/dL 7.6(L)    #C. Diff  - C. Diff PCR pending   #Afib w/ RVR  - No current anticoagulation, was on Eliquis, but was discontinued several weeks ago d/t downtrending H&H.  - Amiodarone  bolus in ED for rate control    Best practice (right click and "Reselect all SmartList Selections" daily)  Diet: NPO Pain/Anxiety/Delirium protocol (if indicated): No VAP protocol (if indicated): Not indicated DVT prophylaxis: Contraindicated GI prophylaxis: N/A Glucose control:  SSI No Central venous access:  N/A Arterial line:  N/A Foley:  N/A Mobility:  bed rest  Code Status:  FULL Disposition:ICU  Labs   CBC: Recent Labs  Lab 12/18/2020 1208  WBC 1.1*   NEUTROABS 0.4*  HGB 10.2*  HCT 30.1*  MCV 82.7  PLT 0000000    Basic Metabolic Panel: Recent Labs  Lab 12/27/2020 1112  NA 128*  K 3.3*  CL 101  CO2 14*  GLUCOSE 59*  BUN 28*  CREATININE 1.70*  CALCIUM 7.6*   GFR: Estimated Creatinine Clearance: 27.4 mL/min (A) (by C-G formula based on SCr of 1.7 mg/dL (H)). Recent Labs  Lab 12/23/2020 1208 01/05/2021 1416  WBC 1.1*  --   LATICACIDVEN  --  2.5*    Liver Function Tests: Recent Labs  Lab 12/23/2020 1112  AST 17  ALT 9  ALKPHOS 79  BILITOT 1.2  PROT 4.8*  ALBUMIN 2.0*   No results for input(s): LIPASE, AMYLASE in the last 168 hours. No results for input(s): AMMONIA in the last 168 hours.  ABG    Component Value Date/Time   PHART 7.43 12/29/2020 1521   PCO2ART 20 (L) 12/11/2020 1521   PO2ART 77 (L) 12/14/2020 1521   HCO3 13.3 (L) 01/02/2021 1521   ACIDBASEDEF 9.3 (H) 01/08/2021 1521   O2SAT 95.6 12/30/2020 1521     Coagulation Profile: Recent Labs  Lab 12/20/2020 1112  INR 1.2    Cardiac Enzymes: No results for input(s): CKTOTAL, CKMB, CKMBINDEX, TROPONINI in the last 168 hours.  HbA1C: No results found for: HGBA1C  CBG: Recent Labs  Lab 01/08/2021 1627  GLUCAP 68*     Past Medical History:  She,  has a past medical history of Atrial fibrillation ([place]), C. difficile diarrhea, GERD (gastroesophageal reflux disease), Hyperlipidemia, Hypertension, Insomnia, Seizure ([place]), Thyroid disease, and Vasculitis ([place]).   Surgical History:  History reviewed. No pertinent surgical history.   Social History:   reports that she has never smoked. She has never used smokeless tobacco. She reports that she does not currently use alcohol.   Family History:  Her family history is not on file.   Allergies Allergies  Allergen Reactions   Codeine Shortness Of Breath   Sulfa Antibiotics Shortness Of Breath     Home Medications  Prior to Admission medications   Not on File      DVT/GI PRX  assessed I Assessed  the need for Labs I Assessed the need for Foley I Assessed the need for Central Venous Line Family Discussion when available I Assessed the need for Mobilization I made an Assessment of medications to be adjusted accordingly Safety Risk assessment completed  CASE DISCUSSED IN MULTIDISCIPLINARY ROUNDS WITH ICU TEAM     Critical Care Time devoted to patient care services described in this note is 75 minutes.  Critical care was necessary to treat /prevent imminent and life-threatening deterioration. Overall, patient is critically ill, prognosis is guarded.  Patient with Multiorgan failure and at high risk for cardiac arrest and death.    Corrin Parker, M.D.  [facility]  Medical Director [place] Director [facility] Cardio-Pulmonary Department    +

## 2021-01-09 NOTE — Consult Note (Signed)
[facility] Cardiology  CARDIOLOGY CONSULT NOTE  Patient ID: Lauren Bradshaw MRN: [id number] DOB/AGE: Aug 06, 1950 70 y.o.  Admit date: 02-02-2021 Referring Physician Minna Antis Primary Physician Regino Bellow, MD Primary Cardiologist NA Reason for Consultation AF with RVR  HPI:  Lauren Bradshaw is a 70 year old female with a history of atrial fibrillation, hypertension, hyperlipidemia, vasculitis who presents to the emergency department with generalized weakness and was subsequently discovered to be in atrial fibrillation with RVR. S  At the time of my evaluation the patient was quite somnolent and unable to answer any questions.  Her daughter-in-law was present and provided history.  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 difficile infection.  She has had atrial fibrillation with RVR earlier this month which was treated conservatively.  She has had issues with anemia of unclear etiology resulting in need for blood transfusion as recently as Thursday.  She was discharged from acute inpatient rehab on Friday and was doing well over the weekend until Saturday when she started having worsening lethargy and fatigue.  Her family was having to lift her to do transfers which was new.  She is having several episodes of watery diarrhea.  Today she was so lethargic that she was brought to the emergency department for further evaluation.  On arrival to the emergency department a code stroke was called for concern about right-sided weakness.  CT scan of her head was negative for acute abnormality.  Labs are notable for a sodium of 128, bicarb 14, creatinine 1.7, WBC 1.1 (ANC 400).  She was in atrial fibrillation with RVR with rates to approximately 150, and relative hypotension with blood pressures in the 90s systolic.  Review of systems complete and found to be negative unless listed above     Past Medical History:  Diagnosis Date    Atrial fibrillation (HCC)    C. difficile diarrhea    GERD (gastroesophageal reflux disease)    Hyperlipidemia    Hypertension    Insomnia    Seizure (HCC)    Thyroid disease    Vasculitis (HCC)       (Not in a hospital admission)  Social History   Socioeconomic History   Marital status: Married    Spouse name: Not on file   Number of children: Not on file   Years of education: Not on file   Highest education level: Not on file  Occupational History   Not on file  Tobacco Use   Smoking status: Never   Smokeless tobacco: Never  Substance and Sexual Activity   Alcohol use: Not Currently   Drug use: Not on file   Sexual activity: Not on file  Other Topics Concern   Not on file  Social History Narrative   Not on file   Social Determinants of Health   Financial Resource Strain: Not on file  Food Insecurity: Not on file  Transportation Needs: Not on file  Physical Activity: Not on file  Stress: Not on file  Social Connections: Not on file  Intimate Partner Violence: Not on file    No family history on file.    Review of systems complete and found to be negative unless listed above      PHYSICAL EXAM  General: Lethargic appearing with elevated respiratory rate. HEENT:  Normocephalic and atramatic Neck:  No JVD.  Lungs: Clear in anterior lung fields.  Tachypneic. Heart: Irregularly irregular.  Tachycardic.Marland Kitchen Normal  S1 and S2 without gallops or murmurs.  Abdomen: Bowel sounds are positive, abdomen soft and non-tender  Msk:  Back normal, normal gait. Normal strength and tone for age. Extremities: No clubbing, cyanosis or edema.   Neuro: Unable to answer questions.  No obvious focal deficits. Psych: Unable to accurately assess.  Labs:   Lab Results  Component Value Date   WBC 1.1 (LL) 01/08/2021   HGB 10.2 (L) 12/13/2020   HCT 30.1 (L) 01/04/2021   MCV 82.7 12/21/2020   PLT 179 12/22/2020    Recent Labs  Lab 01/06/2021 1112  NA 128*  K 3.3*  CL 101   CO2 14*  BUN 28*  CREATININE 1.70*  CALCIUM 7.6*  PROT 4.8*  BILITOT 1.2  ALKPHOS 79  ALT 9  AST 17  GLUCOSE 59*   No results found for: CKTOTAL, CKMB, CKMBINDEX, TROPONINI No results found for: CHOL No results found for: HDL No results found for: LDLCALC No results found for: TRIG No results found for: CHOLHDL No results found for: LDLDIRECT    Radiology: CT HEAD CODE STROKE WO CONTRAST  Result Date: 12/14/2020 CLINICAL DATA:  Code stroke. EXAM: CT HEAD WITHOUT CONTRAST TECHNIQUE: Contiguous axial images were obtained from the base of the skull through the vertex without intravenous contrast. COMPARISON:  None. FINDINGS: Brain: There is no acute intracranial hemorrhage, mass effect, or edema. Gray-white differentiation is preserved. Ventricles and sulci are normal in size and configuration. Patchy low-density in the supratentorial white matter is nonspecific but may reflect chronic microvascular ischemic changes. No extra-axial collection. Vascular: No hyperdense vessel. There is intracranial atherosclerotic calcification at the skull base. Skull: Unremarkable. Sinuses/Orbits: No acute or significant abnormality. Other: Mastoid air cells are clear. ASPECTS ([place] Stroke Program Early CT Score) - Ganglionic level infarction (caudate, lentiform nuclei, internal capsule, insula, M1-M3 cortex): 7 - Supraganglionic infarction (M4-M6 cortex): 3 Total score (0-10 with 10 being normal): 10 IMPRESSION: There is no acute intracranial hemorrhage or evidence of acute infarction. ASPECT score is 10. Chronic microvascular ischemic changes. These results were called by telephone at the time of interpretation on 12/30/2020 at 10:10 am to provider [facility] , who verbally acknowledged these results. Electronically Signed   By: Macy Mis M.D.   On: 12/30/2020 10:11    EKG: Atrial fibrillation with RVR.  ASSESSMENT AND PLAN:  Lauren Bradshaw is a 70 year old female with a history of atrial  fibrillation, hypertension, hyperlipidemia, vasculitis who presents to the emergency department with generalized weakness and was subsequently discovered to be in atrial fibrillation with RVR. She also has multisystem organ failure possibly related to sepsis.  # AF with RVR # Multisystem organ failure The patient presents with fatigue and was subsequently discovered to have neutropenia, elevated creatinine, and altered mental status.  The etiology of this is unclear but certainly could be concerning for severe sepsis or C. difficile.  In the setting she has atrial fibrillation with RVR to the 150s with relative hypotension.  The atrial fibrillation is certainly a reflection of her severe disease state. -Recommend ongoing management of medical issues per primary inpatient team. -Recommend initiation of IV amiodarone bolus plus drip for treatment of her atrial fibrillation. -Would hold on anticoagulation at this time given her recent bleeding issues and need for transfusion. -Complete echocardiogram  Signed: Andrez Grime MD 12/17/2020, 1:41 PM

## 2021-01-09 NOTE — Progress Notes (Signed)
Pharmacy Antibiotic Note  Lauren Bradshaw is a 70 y.o. female admitted on 01/05/2021. Pharmacy has been consulted for ciprofloxacin dosing for intra-abdominal infection.  Plan: Ciprofloxacin 400 mg IV q24h Monitor renal function and adjust dose as clinically indicated Follow up cultures  Height: 5\' 4"  (162.6 cm) Weight: 54.7 kg (120 lb 9.5 oz) IBW/kg (Calculated) : 54.7  Temp (24hrs), Avg:97.5 F (36.4 C), Min:97.5 F (36.4 C), Max:97.5 F (36.4 C)  Recent Labs  Lab 01/07/2021 1112 12/28/2020 1208 12/13/2020 1416  WBC  --  1.1*  --   CREATININE 1.70*  --   --   LATICACIDVEN  --   --  2.5*     Estimated Creatinine Clearance: 27.4 mL/min (A) (by C-G formula based on SCr of 1.7 mg/dL (H)).    Allergies  Allergen Reactions   Codeine Shortness Of Breath   Sulfa Antibiotics Shortness Of Breath    Antimicrobials this admission: 10/31 cefepime/vanc/zosyn x1 10/31 ciprofloxacin >> 10/31 metronidazole >>   Dose adjustments this admission:   Microbiology results: 10/31 BCx: sent 10/31 UCx: sent  10/31 C diff PCR: sent 10/31 GI PCR: sent  Thank you for allowing pharmacy to be a part of this patient's care.  11/31 Sahas Sluka 12/21/2020 5:31 PM

## 2021-01-09 NOTE — Progress Notes (Signed)
Chaplain Maggie responded to code stroke to ED. Pt unavailable. Continued support available per on call chaplain.

## 2021-01-09 NOTE — Progress Notes (Signed)
Difficult stick  

## 2021-01-09 NOTE — ED Provider Notes (Addendum)
[facility] Emergency Department Provider Note  Time seen: 9:57 AM  I have reviewed the triage vital signs and the nursing notes.   HISTORY  Chief Complaint Weakness  HPI Lauren Bradshaw is a 70 y.o. female who presents emergency department for generalized fatigue/weakness.  According to the patient she has been experiencing diarrhea with significant weakness this morning and called EMS.  EMS states upon their arrival she appeared to be more weak on the right side.  They checked a blood glucose that was 57 and administered dextrose.  Patient continues to feel weak upon arrival to the emergency department.  Patient denies any focal deficit/weakness.  EMS activated code stroke.  No past medical history on file.  There are no problems to display for this patient.   Prior to Admission medications   Not on File    Not on File  No family history on file.  Social History    Review of Systems Constitutional: Negative for fever.  Generalized weakness Cardiovascular: Negative for chest pain. Respiratory: Negative for shortness of breath. Gastrointestinal: Negative for abdominal pain.  Positive for diarrhea. Genitourinary: Negative for urinary compaints Musculoskeletal: Negative for musculoskeletal complaints Neurological: Negative for headache All other ROS negative  ____________________________________________   PHYSICAL EXAM:  Constitutional: Alert and oriented.  does appear fatigued and somewhat pale. Eyes: Normal exam ENT      Head: Normocephalic and atraumatic.      Mouth/Throat: Dry appearing mucous membranes Cardiovascular: Irregular rhythm rate around 140 bpm. Respiratory: Shallow respirations tachypneic around 30 breaths/min.  No obvious rales or rhonchi. Gastrointestinal: Soft and nontender. No distention.   Musculoskeletal: Nontender with normal range of motion in all extremities.  Neurologic:  Normal speech and language. No gross focal  neurologic deficits.  No cranial nerve deficits.  Equal grip strength bilaterally.  No pronator drift.  NIH stroke scale of 0 currently. Skin:  Skin is warm, dry and intact.  Psychiatric: Mood and affect are normal.   ____________________________________________    EKG  EKG viewed and interpreted by myself shows atrial fibrillation with a rapid ventricular response at 145 bpm with a narrow QRS, normal axis, normal intervals with nonspecific ST changes without elevation.  ____________________________________________    RADIOLOGY  CT is negative for acute abnormality X-ray concerning for possible pneumonia. ____________________________________________   INITIAL IMPRESSION / ASSESSMENT AND PLAN / ED COURSE  Pertinent labs & imaging results that were available during my care of the patient were reviewed by me and considered in my medical decision making (see chart for details).   Patient presents emergency department as a code stroke for weakness more right-sided per EMS.  Upon arrival patient has equal strength bilaterally.  Patient states she has been experiencing diarrhea fatigue weakness.  Given no focal findings upon arrival highly suspect more of an infectious/metabolic cause to the patient's weakness.  We will check labs, IV hydrate, check a COVID swab.  We will continue to closely monitor while awaiting results.  Patient was activated as a code stroke we will obtain a CT scan of the head as a precaution.  CT scans negative for acute abnormality.  Lab work shows hyponatremia with a sodium of 128.  Borderline hypoglycemia with a glucose of 59, bicarb of 14 creatinine 1.7 indicating acute kidney injury with a anion gap of 13.  We will continue to IV hydrate.  Patient's white blood cell count is pending given the patient's recent significant diarrhea and now reported history of recent C. difficile  infection concern for possible recurrent C. difficile.  We will check a C. difficile test as  well as bio fire stool panel.  Patient continues to be in atrial fibrillation with rapid ventricular response at 140 bpm.  We will dose diltiazem, additional liter of fluid.  Patient will be admitted once emergency department work-up is completed.  Patient's white blood cell count has resulted at 1.1 with a neutrophil count of 0.4.  This could possibly be due to rituximab however is not clear.  No old labs at this time for comparison.  Registration currently working to see if we can get care everywhere accessed.  Given the patient's leukopenia with neutropenia tachycardia tachypnea and hypotension we will start broad-spectrum antibiotics.  I spoke to cardiology who recommends starting on amiodarone infusion.  We will start amiodarone infusion, broad-spectrum antibiotics continue with IV fluids as needed for hypotension and admit to the intensive care unit.  We will hold off on MRI imaging for now given the patient's current vital signs and clinical presentation.  I spoke with Dr. Belia Heman of the ICU team who will be down to see the patient shortly.  Lauren Bradshaw was evaluated in Emergency Department on January 25, 2021 for the symptoms described in the history of present illness. She was evaluated in the context of the global COVID-19 pandemic, which necessitated consideration that the patient might be at risk for infection with the SARS-CoV-2 virus that causes COVID-19. Institutional protocols and algorithms that pertain to the evaluation of patients at risk for COVID-19 are in a state of rapid change based on information released by regulatory bodies including the CDC and federal and state organizations. These policies and algorithms were followed during the patient's care in the ED.  CRITICAL CARE Performed by: Minna Antis   Total critical care time: 60 minutes  Critical care time was exclusive of separately billable procedures and treating other patients.  Critical care was necessary to treat or  prevent imminent or life-threatening deterioration.  Critical care was time spent personally by me on the following activities: development of treatment plan with patient and/or surrogate as well as nursing, discussions with consultants, evaluation of patient's response to treatment, examination of patient, obtaining history from patient or surrogate, ordering and performing treatments and interventions, ordering and review of laboratory studies, ordering and review of radiographic studies, pulse oximetry and re-evaluation of patient's condition.  ____________________________________________   FINAL CLINICAL IMPRESSION(S) / ED DIAGNOSES  Weakness Atrial fibrillation with rapid ventricular spots Sepsis Neutropenia Hypotension        Minna Antis, MD 01/25/21 1505

## 2021-01-09 NOTE — Progress Notes (Signed)
CODE STROKE- PHARMACY COMMUNICATION   Time CODE STROKE called/page received: 0933  Time response to CODE STROKE was made (in person or via phone): [phone]  Time Stroke Kit retrieved from [place] (only if needed): no TNK given  Name of Provider/Nurse contacted: Dr. Cheral Marker  Past Medical History:  Diagnosis Date   Atrial fibrillation ([place])    C. difficile diarrhea    GERD (gastroesophageal reflux disease)    Hyperlipidemia    Hypertension    Insomnia    Seizure ([place])    Thyroid disease    Vasculitis ([place])    Prior to Admission medications   Not on [place] ,PharmD Clinical Pharmacist  12/21/2020  12:43 PM

## 2021-01-09 NOTE — Progress Notes (Signed)
PHARMACY -  BRIEF ANTIBIOTIC NOTE   Pharmacy has received consult(s) for vancomycin and cefepime from an ED provider.  The patient's profile has been reviewed for ht/wt/allergies/indication/available labs.    One time order(s) placed for: Cefepime 2 g Vancomycin 1 g  Further antibiotics/pharmacy consults should be ordered by admitting physician if indicated.                       Thank you, Robyne Peers Lorren Splawn 12/20/2020  1:48 PM

## 2021-01-09 NOTE — Progress Notes (Signed)
Notified bedside nurse of need to draw repeat lactic acid @ 1700.

## 2021-01-09 NOTE — Progress Notes (Addendum)
1745 Received from ED .Very lethargic. Crys out in  pain when touched. B/P low but in parameters. 1815 B/P 72/60 patient more lethargic. Dr. Belia Heman called for central line after unable to find peripheral site to start Levophed for low B/P. 1830 D.Graves in to place TLC. Unable to place in left IJ due to dehydration. TLC placed in left groin. Given pain medication x 2 during procedure.

## 2021-01-09 NOTE — Progress Notes (Signed)
Notified provider of need to order repeat lactic acid @ 1700. 

## 2021-01-09 NOTE — ED Notes (Signed)
IV team at bedside 

## 2021-01-09 NOTE — Progress Notes (Signed)
Elink following code sepsis °

## 2021-01-09 NOTE — Procedures (Signed)
Central Venous Catheter Insertion Procedure Note  Lauren Bradshaw  [id number]  [birth year]  Date:12/13/2020  Time:7:33 PM   Provider Performing:Caress Reffitt Royann Shivers   Procedure: Insertion of Non-tunneled Central Venous [phone]) with US guidance ([id number])   Indication(s) Medication administration and Difficult access  Consent Risks of the procedure as well as the alternatives and risks of each were explained to the patient and/or caregiver.  Consent for the procedure was obtained and is signed in the bedside chart  Anesthesia Topical only with 1% lidocaine   Timeout Verified patient identification, verified procedure, site/side was marked, verified correct patient position, special equipment/implants available, medications/allergies/relevant history reviewed, required imaging and test results available.  Sterile Technique Maximal sterile technique including full sterile barrier drape, hand hygiene, sterile gown, sterile gloves, mask, hair covering, sterile ultrasound probe cover (if used).  Procedure Description Area of catheter insertion was cleaned with chlorhexidine and draped in sterile fashion.  With real-time ultrasound guidance a central venous catheter was placed into the left femoral vein. Nonpulsatile blood flow and easy flushing noted in all ports.  The catheter was sutured in place and sterile dressing applied.  Complications/Tolerance None; patient tolerated the procedure well. Chest X-ray is ordered to verify placement for internal jugular or subclavian cannulation.   Chest x-ray is not ordered for femoral cannulation.  EBL Minimal  Specimen(s) None  Camie Patience, AGNP  Pulmonary/Critical Care Pager [phone] (please enter 7 digits) PCCM Consult Pager [phone] (please enter 7 digits)

## 2021-01-09 NOTE — ED Notes (Signed)
Phlebotomist at bedside.

## 2021-01-09 NOTE — Progress Notes (Signed)
Notified bedside nurse of need to draw LA and give antibiotics

## 2021-01-09 NOTE — Progress Notes (Signed)
CODE SEPSIS - PHARMACY COMMUNICATION  **Broad Spectrum Antibiotics should be administered within 1 hour of Sepsis diagnosis**  Time Code Sepsis Called/Page Received: 1325  Antibiotics Ordered: cefepime, vancomycin, metronidazole  Time of 1st antibiotic administration: 1528  Additional action taken by pharmacy: pt noted to be difficult stick   Raiford Noble ,PharmD Clinical Pharmacist  01-11-21  1:50 PM

## 2021-01-09 NOTE — ED Triage Notes (Signed)
Pt via EMS from home. Pt endorsed R sided weakness and LKW 0730. Per EMS, initial CBG 57 EMS gave D50, and CBG on arrival 74. Pt is A&Ox4 but lethargic. Pt recently recovering from C Diff, pt c/o abd pain.

## 2021-01-09 NOTE — Progress Notes (Signed)
Spoke to RN in room about repeat lactic acid

## 2021-01-09 NOTE — ED Notes (Signed)
Rn aware of bed assignment 

## 2021-01-09 NOTE — ED Notes (Signed)
Pt presents to the ED via EMS. EMS was called for R sided weakness and lethargy. Pt was recently d/c from [facility] for C Diff. Daughter in law states that she has been having diarrhea for the entire weekend. On EMS arrival pt's CBG was 57 and EMS gave D50 and CBG now increased to 74. On arrival, pt is A&Ox4 and able to respond to all questions but pt is lethargic. Pt endorses lower abd pain.

## 2021-01-09 NOTE — Progress Notes (Addendum)
Pharmacy Antibiotic Note  Lauren Bradshaw is a 70 y.o. female admitted on 01/14/21. Pharmacy has been consulted for Zosyn dosing for intra-abdominal infection/UTI.  Plan: Zosyn 3.375g IV q8h (4 hour infusion). Monitor renal function and adjust dose as clinically indicated Follow up cultures  Height: 5\' 4"  (162.6 cm) Weight: 54.7 kg (120 lb 9.5 oz) IBW/kg (Calculated) : 54.7  Temp (24hrs), Avg:97.5 F (36.4 C), Min:97.5 F (36.4 C), Max:97.5 F (36.4 C)  Recent Labs  Lab 14-Jan-2021 1112 14-Jan-2021 1208 2021-01-14 1416  WBC  --  1.1*  --   CREATININE 1.70*  --   --   LATICACIDVEN  --   --  2.5*    Estimated Creatinine Clearance: 27.4 mL/min (A) (by C-G formula based on SCr of 1.7 mg/dL (H)).    Allergies  Allergen Reactions   Codeine Shortness Of Breath   Sulfa Antibiotics Shortness Of Breath    Antimicrobials this admission: 10/31 cefepime/vanc x1 10/31 zosyn >>   Dose adjustments this admission:   Microbiology results: 10/31 BCx: sent 10/31 UCx: sent  10/31 C diff PCR: sent 10/31 GI PCR: sent  Thank you for allowing pharmacy to be a part of this patient's care.  Honestee Revard O Emani Morad 2021-01-14 4:20 PM

## 2021-01-09 NOTE — Consult Note (Addendum)
Date of Consultation:  12/26/2020  Requesting Physician:  Flora Lipps, MD  Reason for Consultation:  Clostridium difficile pancolitis  History of Present Illness: Lauren Bradshaw is a 70 y.o. female admitted today with worsening lethargy from home.  She has a recent history of pneumonia complicated by cdiff infection at outside hospital, with subsequent issues with atrial fibrillation and vasculitis.  She had a kidney biopsy which was complicated by a bleed requiring coil embolization.  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  She eventually was discharged from rehab to home last week.  Over the weekend however, she became more lethargic, and copious amounts of diarrhea, and was brought to the ER today for evaluation.  Her care has been at [facility], but given concerns for possible stroke, EMS brought her to [facility] today.    On workup, the patient was found to be hypoglycemic, with afib with RVR, with severe abdominal pain.  Cdiff testing was positive.  CT scan of abdomen/pelvis showed significant pancolitis with marked colonic wall thickening.  No evidence of perforation or megacolon on images.  Her WBC was low at 1.1 with ANC 400, AKI with Cr 1.7, hyponatremic, with lactic acid of 2.5.  She was admitted to ICU, and is currently on antibiotics (IV flagyl and po vancomycin) for her cdiff, amiodarone drip, and IV fluids.  She reports she's having a lot of abdominal pain.    Past Medical History: Past Medical History:  Diagnosis Date   Atrial fibrillation (HCC)    C. difficile diarrhea    GERD (gastroesophageal reflux disease)    Hyperlipidemia    Hypertension    Insomnia    Seizure ([place])    Thyroid disease    Vasculitis ([place])      Past Surgical History: History reviewed. No pertinent surgical history.  Home Medications: Prior to Admission medications   Medication Sig Start Date End Date Taking? Authorizing Provider  amLODipine (NORVASC) 10 MG tablet Take 10 mg by mouth daily.  11/30/20  Yes [provider]  atenolol (TENORMIN) 25 MG tablet Take 25 mg by mouth daily. 08/27/20  Yes [provider]  atorvastatin (LIPITOR) 40 MG tablet Take 40 mg by mouth daily. 01/07/21  Yes [provider]  carvedilol (COREG) 6.25 MG tablet Take 6.25 mg by mouth 2 (two) times daily. 11/30/20  Yes [provider]  diclofenac (VOLTAREN) 75 MG EC tablet Take 75 mg by mouth 2 (two) times daily. 11/11/20  Yes [provider]  furosemide (LASIX) 20 MG tablet Take 20 mg by mouth 2 (two) times daily. 11/16/20  Yes [provider]  gabapentin (NEURONTIN) 600 MG tablet Take 600 mg by mouth 4 (four) times daily. 11/13/20  Yes [provider]  ibuprofen (ADVIL) 800 MG tablet Take 800 mg by mouth 3 (three) times daily. 11/14/20  Yes [provider]  levothyroxine (SYNTHROID) 75 MCG tablet Take 75 mcg by mouth daily. 01/05/21  Yes [provider]  metoprolol succinate (TOPROL-XL) 50 MG 24 hr tablet Take 50 mg by mouth daily. 01/07/21  Yes [provider]  montelukast (SINGULAIR) 10 MG tablet Take 10 mg by mouth at bedtime. 12/03/20  Yes [provider]  omeprazole (PRILOSEC) 40 MG capsule Take 1 capsule by mouth in the morning. 09/26/20  Yes [provider]  ondansetron (ZOFRAN) 4 MG tablet Take by mouth. 01/05/21  Yes [provider]  pantoprazole (PROTONIX) 40 MG tablet Take 40 mg by mouth 2 (two) times daily. 01/05/21  Yes [provider]  potassium chloride SA (KLOR-CON) 20 MEQ tablet Take 20 mEq by mouth daily. 01/06/21  Yes [provider]  rosuvastatin (CRESTOR) 20 MG tablet Take 20 mg by mouth at bedtime. 12/05/20  Yes [provider]  triamterene-hydrochlorothiazide (DYAZIDE) 37.5-25 MG capsule Take 1 capsule by mouth every morning. 11/15/20  Yes [provider]  cefUROXime (CEFTIN) 500 MG tablet Take 500 mg by mouth 2 (two) times daily. Patient not taking: No  sig reported 10/17/20   [provider]    Allergies: Allergies  Allergen Reactions   Codeine Shortness Of Breath   Sulfa Antibiotics Shortness Of Breath    Social History:  reports that she has never smoked. She has never used smokeless tobacco. She reports that she does not currently use alcohol. No history on file for drug use.   Family History: No family history on file.  Review of Systems: Review of Systems  Unable to perform ROS: Acuity of condition   Physical Exam BP 109/63   Pulse (!) 125   Temp (!) 100.7 F (38.2 C) (Axillary)   Resp (!) 37   Ht 5\' 4"  (1.626 m)   Wt 54.7 kg   SpO2 94%   BMI 20.70 kg/m  CONSTITUTIONAL: Sleepy, with some discomfort. HEENT:  Normocephalic, atraumatic, extraocular motion intact. NECK: Trachea is midline, and there is no jugular venous distension. RESPIRATORY:  Normal respiratory effort without pathologic use of accessory muscles. CARDIOVASCULAR: Atrial fibrillation with RVR. GI: The abdomen is soft, non-distended, with diffuse tenderness throughout but less so in the left lower quadrant. MUSCULOSKELETAL:  Normal muscle strength and tone in all four extremities.  No peripheral edema or cyanosis. SKIN: Skin turgor is normal. There are no pathologic skin lesions.  NEUROLOGIC:  Motor and sensation is grossly normal.  Cranial nerves are grossly intact. PSYCH:  Alert and oriented to person, place and time. Affect is normal.  Laboratory Analysis: Results for orders placed or performed during the hospital encounter of Jan 11, 2021 (from the past 24 hour(s))  Resp Panel by RT-PCR (Flu A&B, Covid) Nasopharyngeal Swab     Status: None   Collection Time: January 11, 2021 10:14 AM   Specimen: Nasopharyngeal Swab; Nasopharyngeal(NP) swabs in vial transport medium  Result Value Ref Range   SARS Coronavirus 2 by RT PCR NEGATIVE NEGATIVE   Influenza A by PCR NEGATIVE NEGATIVE   Influenza B by PCR NEGATIVE NEGATIVE  C Difficile Quick Screen w PCR  reflex     Status: Abnormal   Collection Time: 01-11-21 10:14 AM   Specimen: STOOL  Result Value Ref Range   C Diff antigen POSITIVE (A) NEGATIVE   C Diff toxin POSITIVE (A) NEGATIVE   C Diff interpretation Toxin producing C. difficile detected.   Gastrointestinal Panel by PCR , Stool     Status: None   Collection Time: 01/11/2021 10:14 AM   Specimen: STOOL  Result Value Ref Range   Campylobacter species NOT DETECTED NOT DETECTED   Plesimonas shigelloides NOT DETECTED NOT DETECTED   Salmonella species NOT DETECTED NOT DETECTED   Yersinia enterocolitica NOT DETECTED NOT DETECTED   Vibrio species NOT DETECTED NOT DETECTED   Vibrio cholerae NOT DETECTED NOT DETECTED   Enteroaggregative E coli (EAEC) NOT DETECTED NOT DETECTED   Enteropathogenic E coli (EPEC) NOT DETECTED NOT DETECTED   Enterotoxigenic E coli (ETEC) NOT DETECTED NOT DETECTED   Shiga like toxin producing E coli (STEC) NOT DETECTED NOT DETECTED   Shigella/Enteroinvasive E coli (EIEC) NOT DETECTED  NOT DETECTED   Cryptosporidium NOT DETECTED NOT DETECTED   Cyclospora cayetanensis NOT DETECTED NOT DETECTED   Entamoeba histolytica NOT DETECTED NOT DETECTED   Giardia lamblia NOT DETECTED NOT DETECTED   Adenovirus F40/41 NOT DETECTED NOT DETECTED   Astrovirus NOT DETECTED NOT DETECTED   Norovirus GI/GII NOT DETECTED NOT DETECTED   Rotavirus A NOT DETECTED NOT DETECTED   Sapovirus (I, II, IV, and V) NOT DETECTED NOT DETECTED  Protime-INR     Status: Abnormal   Collection Time: 12/13/2020 11:12 AM  Result Value Ref Range   Prothrombin Time 15.3 (H) 11.4 - 15.2 seconds   INR 1.2 0.8 - 1.2  APTT     Status: None   Collection Time: 01/06/2021 11:12 AM  Result Value Ref Range   aPTT 34 24 - 36 seconds  Comprehensive metabolic panel     Status: Abnormal   Collection Time: 12/28/2020 11:12 AM  Result Value Ref Range   Sodium 128 (L) 135 - 145 mmol/L   Potassium 3.3 (L) 3.5 - 5.1 mmol/L   Chloride 101 98 - 111 mmol/L   CO2 14 (L) 22  - 32 mmol/L   Glucose, Bld 59 (L) 70 - 99 mg/dL   BUN 28 (H) 8 - 23 mg/dL   Creatinine, Ser 1.70 (H) 0.44 - 1.00 mg/dL   Calcium 7.6 (L) 8.9 - 10.3 mg/dL   Total Protein 4.8 (L) 6.5 - 8.1 g/dL   Albumin 2.0 (L) 3.5 - 5.0 g/dL   AST 17 15 - 41 U/L   ALT 9 0 - 44 U/L   Alkaline Phosphatase 79 38 - 126 U/L   Total Bilirubin 1.2 0.3 - 1.2 mg/dL   GFR, Estimated 32 (L) >60 mL/min   Anion gap 13 5 - 15  Procalcitonin - Baseline     Status: None   Collection Time: 12/31/2020 11:12 AM  Result Value Ref Range   Procalcitonin 94.99 ng/mL  Magnesium     Status: None   Collection Time: 12/26/2020 11:12 AM  Result Value Ref Range   Magnesium 2.4 1.7 - 2.4 mg/dL  Phosphorus     Status: None   Collection Time: 12/13/2020 11:12 AM  Result Value Ref Range   Phosphorus 3.9 2.5 - 4.6 mg/dL  CBC with Differential/Platelet     Status: Abnormal   Collection Time: 12/23/2020 12:08 PM  Result Value Ref Range   WBC 1.1 (LL) 4.0 - 10.5 K/uL   RBC 3.64 (L) 3.87 - 5.11 MIL/uL   Hemoglobin 10.2 (L) 12.0 - 15.0 g/dL   HCT 30.1 (L) 36.0 - 46.0 %   MCV 82.7 80.0 - 100.0 fL   MCH 28.0 26.0 - 34.0 pg   MCHC 33.9 30.0 - 36.0 g/dL   RDW 19.5 (H) 11.5 - 15.5 %   Platelets 179 150 - 400 K/uL   nRBC 0.0 0.0 - 0.2 %   Neutrophils Relative % 37 %   Neutro Abs 0.4 (LL) 1.7 - 7.7 K/uL   Lymphocytes Relative 20 %   Lymphs Abs 0.2 (L) 0.7 - 4.0 K/uL   Monocytes Relative 38 %   Monocytes Absolute 0.4 0.1 - 1.0 K/uL   Eosinophils Relative 2 %   Eosinophils Absolute 0.0 0.0 - 0.5 K/uL   Basophils Relative 2 %   Basophils Absolute 0.0 0.0 - 0.1 K/uL   WBC Morphology TOO FEW TO COUNT, SMEAR AVAILABLE FOR REVIEW    RBC Morphology MIXED RBC MORPHOLOGY PRESENT    Smear Review  Normal platelet morphology    Immature Granulocytes 1 %   Abs Immature Granulocytes 0.01 0.00 - 0.07 K/uL   Burr Cells PRESENT   Lactic acid, plasma     Status: Abnormal   Collection Time: 12/30/2020  2:16 PM  Result Value Ref Range   Lactic Acid,  Venous 2.5 (HH) 0.5 - 1.9 mmol/L  Blood gas, arterial     Status: Abnormal   Collection Time: 12/16/2020  3:21 PM  Result Value Ref Range   FIO2 0.21    pH, Arterial 7.43 7.350 - 7.450   pCO2 arterial 20 (L) 32.0 - 48.0 mmHg   pO2, Arterial 77 (L) 83.0 - 108.0 mmHg   Bicarbonate 13.3 (L) 20.0 - 28.0 mmol/L   Acid-base deficit 9.3 (H) 0.0 - 2.0 mmol/L   O2 Saturation 95.6 %   Patient temperature 37.0    Collection site RIGHT RADIAL    Sample type ARTERIAL DRAW   CBG monitoring, ED     Status: Abnormal   Collection Time: 12/15/2020  4:27 PM  Result Value Ref Range   Glucose-Capillary 68 (L) 70 - 99 mg/dL  MRSA Next Gen by PCR, Nasal     Status: None   Collection Time: 12/12/2020  6:05 PM   Specimen: Nasal Mucosa; Nasal Swab  Result Value Ref Range   MRSA by PCR Next Gen NOT DETECTED NOT DETECTED  Lactic acid, plasma     Status: Abnormal   Collection Time: 12/11/2020  6:33 PM  Result Value Ref Range   Lactic Acid, Venous 4.4 (HH) 0.5 - 1.9 mmol/L  Amylase     Status: Abnormal   Collection Time: 01/03/2021  7:39 PM  Result Value Ref Range   Amylase 13 (L) 28 - 100 U/L  Lipase, blood     Status: None   Collection Time: 01/03/2021  7:39 PM  Result Value Ref Range   Lipase 19 11 - 51 U/L  TSH     Status: None   Collection Time: 12/11/2020  7:39 PM  Result Value Ref Range   TSH 1.872 0.350 - 4.500 uIU/mL  Blood gas, venous     Status: Abnormal   Collection Time: 01/08/2021  7:42 PM  Result Value Ref Range   FIO2 0.21    pH, Ven 7.48 (H) 7.250 - 7.430   pCO2, Ven 32 (L) 44.0 - 60.0 mmHg   pO2, Ven 47.0 (H) 32.0 - 45.0 mmHg   Bicarbonate 23.8 20.0 - 28.0 mmol/L   Acid-Base Excess 0.6 0.0 - 2.0 mmol/L   O2 Saturation 85.9 %   Patient temperature 37.0    Collection site LINE    Sample type VENOUS   Glucose, capillary     Status: None   Collection Time: 12/12/2020  8:05 PM  Result Value Ref Range   Glucose-Capillary 91 70 - 99 mg/dL  Lactic acid, plasma     Status: Abnormal   Collection  Time: 01/03/2021 10:00 PM  Result Value Ref Range   Lactic Acid, Venous 6.4 (HH) 0.5 - 1.9 mmol/L  Hemoglobin and hematocrit, blood     Status: Abnormal   Collection Time: 12/15/2020 10:00 PM  Result Value Ref Range   Hemoglobin 9.3 (L) 12.0 - 15.0 g/dL   HCT 26.2 (L) 36.0 - 46.0 %    Imaging: CT ABDOMEN PELVIS WO CONTRAST  Addendum Date: 12/16/2020   ADDENDUM REPORT: 01/02/2021 16:17 ADDENDUM: Additional clinical history has been provided. The patient recently underwent a random left renal biopsy a  few weeks ago that a required embolization. As such, the left renal subcapsular fluid collection likely represents old hematoma and the previously described 4 mm left renal calculus is likely a vascular coil. Electronically Signed   By: Obie DredgeWilliam T Derry M.D.   On: 12/14/2020 16:17   Result Date: 12/21/2020 CLINICAL DATA:  Diarrhea and weakness. EXAM: CT ABDOMEN AND PELVIS WITHOUT CONTRAST TECHNIQUE: Multidetector CT imaging of the abdomen and pelvis was performed following the standard protocol without IV contrast. COMPARISON:  None. FINDINGS: Lower chest: Small bilateral pleural effusions. Trace pericardial effusion. Mild bibasilar atelectasis/scarring. Hepatobiliary: Small calcification in the right hepatic lobe. No other focal liver abnormality. Mildly distended gallbladder without wall thickening or radiopaque gallstones. No biliary dilatation. Pancreas: Unremarkable. No pancreatic ductal dilatation or surrounding inflammatory changes. Spleen: Normal in size without focal abnormality. Adrenals/Urinary Tract: The adrenal glands and right kidney are unremarkable. 3.9 x 5.5 x 8.7 cm low-density left renal subcapsular fluid collection (series 2, image 36; series 6, image 83). 4 mm calculus in the left kidney. No hydronephrosis. The bladder is unremarkable. Stomach/Bowel: Moderate hiatal hernia. The stomach is otherwise within normal limits. Severe circumferential wall thickening of the entire colon. No  pneumatosis. The small bowel is unremarkable. Normal appendix. Vascular/Lymphatic: Aortic atherosclerosis. No enlarged abdominal or pelvic lymph nodes. Reproductive: Prior hysterectomy. 3.9 cm simple appearing cyst in the right ovary. Other: Trace ascites around the liver, in both paracolic gutters, and in the pelvis. Prominent presacral soft tissue stranding. No pneumoperitoneum. Musculoskeletal: No acute or significant osseous findings. IMPRESSION: 1. Severe circumferential wall thickening of the entire colon, consistent with pancolitis. 2. 8.7 cm low-density left renal subcapsular fluid collection, concerning for abscess. Correlate with urinalysis. 3. Trace ascites.  Small bilateral pleural effusions. 4. 3.9 cm simple appearing right ovarian cyst. Recommend outpatient follow-up with pelvic US. Reference: JACR 2020 Feb;17(2):248-254 5. Aortic Atherosclerosis (ICD10-I70.0). Electronically Signed: By: Obie DredgeWilliam T Derry M.D. On: 12/12/2020 16:01   DG Chest Port 1 View  Result Date: 12/30/2020 CLINICAL DATA:  Possible sepsis Right-sided weakness and lethargic EXAM: PORTABLE CHEST 1 VIEW COMPARISON:  01/07/2019 FINDINGS: Heart size within normal limits. Mild pulmonary vascular congestion. There is new airspace opacity in the right suprahilar lung. IMPRESSION: New airspace opacity in the right suprahilar lung likely due to pneumonia. Radiographic follow-up to resolution is recommended to exclude developing mass. Electronically Signed   By: Acquanetta BellingFarhaan  Mir M.D.   On: 12/18/2020 14:08   CT HEAD CODE STROKE WO CONTRAST  Result Date: 01/06/2021 CLINICAL DATA:  Code stroke. EXAM: CT HEAD WITHOUT CONTRAST TECHNIQUE: Contiguous axial images were obtained from the base of the skull through the vertex without intravenous contrast. COMPARISON:  None. FINDINGS: Brain: There is no acute intracranial hemorrhage, mass effect, or edema. Gray-white differentiation is preserved. Ventricles and sulci are normal in size and  configuration. Patchy low-density in the supratentorial white matter is nonspecific but may reflect chronic microvascular ischemic changes. No extra-axial collection. Vascular: No hyperdense vessel. There is intracranial atherosclerotic calcification at the skull base. Skull: Unremarkable. Sinuses/Orbits: No acute or significant abnormality. Other: Mastoid air cells are clear. ASPECTS ([place] Stroke Program Early CT Score) - Ganglionic level infarction (caudate, lentiform nuclei, internal capsule, insula, M1-M3 cortex): 7 - Supraganglionic infarction (M4-M6 cortex): 3 Total score (0-10 with 10 being normal): 10 IMPRESSION: There is no acute intracranial hemorrhage or evidence of acute infarction. ASPECT score is 10. Chronic microvascular ischemic changes. These results were called by telephone at the time of interpretation on 12/30/2020 at 10:10  am to provider Conni Slipper , who verbally acknowledged these results. Electronically Signed   By: Macy Mis M.D.   On: 01/02/2021 10:11    Assessment and Plan: This is a 70 y.o. female with severe pancolitis in setting of cdiff infection.  --Discussed with the patient and her daughter in law that given her current condition, would try to manage conservatively with IV flagyl and oral vancomycin to see if her cdiff can respond.  Discussed that there is no evidence of bowel perforation or pending perforation on her CT scan.  Discussed that although at this point we can try conservative measures, there's no guarantee that they will work and she may indeed need surgical intervention.  Discussed that the surgery would be a total abdominal colectomy with end ileostomy, which would be a very comorbid surgery in the emergent scenario, and would stress her body more than it already is.  However, if truly needed, would discuss further with them about the surgery and what's involved. --For now, continue antibiotic management, stabilizing/resuscitating patient for her  presenting sepsis picture, serial abdominal exams.   --Will continue following along with you.  I spent 80 minutes dedicated to the care of this patient on the date of this encounter to include pre-visit review of records, face-to-face time with the patient discussing diagnosis and management, and any post-visit coordination of care.   Melvyn Neth, MD [facility] Pg:  [phone]

## 2021-01-09 NOTE — Progress Notes (Addendum)
PHARMACY CONSULT NOTE - FOLLOW UP  Pharmacy Consult for Electrolyte Monitoring and Replacement   Recent Labs: Potassium (mmol/L)  Date Value  01/03/2021 3.3 (L)   Magnesium (mg/dL)  Date Value  29/92/4268 2.4   Calcium (mg/dL)  Date Value  34/19/6222 7.6 (L)   Albumin (g/dL)  Date Value  97/98/9211 2.0 (L)   Phosphorus (mg/dL)  Date Value  94/17/4081 3.9   Sodium (mmol/L)  Date Value  12/15/2020 128 (L)   Corr Ca: 8.88 mg/dL  Assessment: 70 y.o. female who presents emergency department for generalized fatigue/weakness. Pharmacy has been consulted for electrolyte replacement.   On amiodarone gtt  Goal of Therapy:  K 4 - 5.1 mmol/L Mg 2 - 2.4 mg/dL All other electrolytes WNL  Plan:  Na 128 - will defer to primary team K 3.3 - will order 40 mEq PO Kcl x 2 doses Recheck electrolytes with AM labs  Raiford Noble ,PharmD Clinical Pharmacist 01/08/2021 5:21 PM

## 2021-01-10 ENCOUNTER — Inpatient Hospital Stay: Payer: Medicare HMO

## 2021-01-10 ENCOUNTER — Inpatient Hospital Stay
Admit: 2021-01-10 | Discharge: 2021-01-10 | Disposition: A | Discharge status: Home / Self Care | Payer: Medicare HMO | Attending: Cardiology | Admitting: Cardiology

## 2021-01-10 DIAGNOSIS — D649 Anemia, unspecified: Secondary | ICD-10-CM

## 2021-01-10 DIAGNOSIS — A419 Sepsis, unspecified organism: Secondary | ICD-10-CM | POA: Diagnosis not present

## 2021-01-10 DIAGNOSIS — D709 Neutropenia, unspecified: Secondary | ICD-10-CM

## 2021-01-10 DIAGNOSIS — A4189 Other specified sepsis: Secondary | ICD-10-CM | POA: Diagnosis not present

## 2021-01-10 DIAGNOSIS — E878 Other disorders of electrolyte and fluid balance, not elsewhere classified: Secondary | ICD-10-CM

## 2021-01-10 DIAGNOSIS — R197 Diarrhea, unspecified: Secondary | ICD-10-CM | POA: Diagnosis present

## 2021-01-10 DIAGNOSIS — I4891 Unspecified atrial fibrillation: Secondary | ICD-10-CM | POA: Diagnosis not present

## 2021-01-10 DIAGNOSIS — A0472 Enterocolitis due to Clostridium difficile, not specified as recurrent: Secondary | ICD-10-CM | POA: Diagnosis present

## 2021-01-10 DIAGNOSIS — R109 Unspecified abdominal pain: Secondary | ICD-10-CM

## 2021-01-10 DIAGNOSIS — K51018 Ulcerative (chronic) pancolitis with other complication: Secondary | ICD-10-CM | POA: Diagnosis not present

## 2021-01-10 LAB — URINALYSIS, COMPLETE (UACMP) WITH MICROSCOPIC
Bilirubin Urine: NEGATIVE
Glucose, UA: NEGATIVE mg/dL
Hgb urine dipstick: NEGATIVE
Ketones, ur: 5 mg/dL — AB
Leukocytes,Ua: NEGATIVE
Nitrite: NEGATIVE
Protein, ur: 100 mg/dL — AB
Specific Gravity, Urine: 1.026 (ref 1.005–1.030)
pH: 5 (ref 5.0–8.0)

## 2021-01-10 LAB — COMPREHENSIVE METABOLIC PANEL
ALT: 10 U/L (ref 0–44)
AST: 18 U/L (ref 15–41)
Albumin: 1.6 g/dL — ABNORMAL LOW (ref 3.5–5.0)
Alkaline Phosphatase: 130 U/L — ABNORMAL HIGH (ref 38–126)
Anion gap: 16 — ABNORMAL HIGH (ref 5–15)
BUN: 28 mg/dL — ABNORMAL HIGH (ref 8–23)
CO2: 23 mmol/L (ref 22–32)
Calcium: 6.7 mg/dL — ABNORMAL LOW (ref 8.9–10.3)
Chloride: 93 mmol/L — ABNORMAL LOW (ref 98–111)
Creatinine, Ser: 1.61 mg/dL — ABNORMAL HIGH (ref 0.44–1.00)
GFR, Estimated: 35 mL/min — ABNORMAL LOW (ref >60)
Glucose, Bld: 111 mg/dL — ABNORMAL HIGH (ref 70–99)
Potassium: 2.7 mmol/L — CL (ref 3.5–5.1)
Sodium: 132 mmol/L — ABNORMAL LOW (ref 135–145)
Total Bilirubin: 0.8 mg/dL (ref 0.3–1.2)
Total Protein: 3.8 g/dL — ABNORMAL LOW (ref 6.5–8.1)

## 2021-01-10 LAB — ECHOCARDIOGRAM COMPLETE
AR max vel: 2.45 cm2
AV Area VTI: 2.54 cm2
AV Area mean vel: 2.76 cm2
AV Mean grad: 3 mmHg
AV Peak grad: 6.9 mmHg
Ao pk vel: 1.31 m/s
Area-P 1/2: 3.21 cm2
Height: 64 in
MV VTI: 2.25 cm2
S' Lateral: 2.89 cm
Weight: 1929.47 [oz_av]

## 2021-01-10 LAB — BLOOD GAS, ARTERIAL
Acid-Base Excess: 1.5 mmol/L (ref 0.0–2.0)
Bicarbonate: 22.5 mmol/L (ref 20.0–28.0)
FIO2: 0.21
O2 Saturation: 97.7 %
Patient temperature: 37
pCO2 arterial: 24 mmHg — ABNORMAL LOW (ref 32.0–48.0)
pH, Arterial: 7.58 — ABNORMAL HIGH (ref 7.350–7.450)
pO2, Arterial: 83 mmHg (ref 83.0–108.0)

## 2021-01-10 LAB — PHOSPHORUS
Phosphorus: 2.3 mg/dL — ABNORMAL LOW (ref 2.5–4.6)
Phosphorus: 2.9 mg/dL (ref 2.5–4.6)

## 2021-01-10 LAB — LACTIC ACID, PLASMA
Lactic Acid, Venous: 5.3 mmol/L (ref 0.5–1.9)
Lactic Acid, Venous: 4.7 mmol/L (ref 0.5–1.9)
Lactic Acid, Venous: 4.6 mmol/L (ref 0.5–1.9)
Lactic Acid, Venous: 4.2 mmol/L (ref 0.5–1.9)

## 2021-01-10 LAB — BASIC METABOLIC PANEL
Anion gap: 13 (ref 5–15)
BUN: 27 mg/dL — ABNORMAL HIGH (ref 8–23)
CO2: 24 mmol/L (ref 22–32)
Calcium: 6.6 mg/dL — ABNORMAL LOW (ref 8.9–10.3)
Chloride: 96 mmol/L — ABNORMAL LOW (ref 98–111)
Creatinine, Ser: 1.65 mg/dL — ABNORMAL HIGH (ref 0.44–1.00)
GFR, Estimated: 34 mL/min — ABNORMAL LOW (ref >60)
Glucose, Bld: 124 mg/dL — ABNORMAL HIGH (ref 70–99)
Potassium: 2.3 mmol/L — CL (ref 3.5–5.1)
Sodium: 133 mmol/L — ABNORMAL LOW (ref 135–145)

## 2021-01-10 LAB — GLUCOSE, CAPILLARY
Glucose-Capillary: 107 mg/dL — ABNORMAL HIGH (ref 70–99)
Glucose-Capillary: 124 mg/dL — ABNORMAL HIGH (ref 70–99)
Glucose-Capillary: 105 mg/dL — ABNORMAL HIGH (ref 70–99)
Glucose-Capillary: 75 mg/dL (ref 70–99)
Glucose-Capillary: 99 mg/dL (ref 70–99)
Glucose-Capillary: 108 mg/dL — ABNORMAL HIGH (ref 70–99)
Glucose-Capillary: 91 mg/dL (ref 70–99)

## 2021-01-10 LAB — HEMOGLOBIN AND HEMATOCRIT, BLOOD
HCT: 25.2 % — ABNORMAL LOW (ref 36.0–46.0)
HCT: 25.7 % — ABNORMAL LOW (ref 36.0–46.0)
HCT: 27.8 % — ABNORMAL LOW (ref 36.0–46.0)
Hemoglobin: 8.9 g/dL — ABNORMAL LOW (ref 12.0–15.0)
Hemoglobin: 9.1 g/dL — ABNORMAL LOW (ref 12.0–15.0)
Hemoglobin: 9.6 g/dL — ABNORMAL LOW (ref 12.0–15.0)

## 2021-01-10 LAB — CBC
HCT: 25.7 % — ABNORMAL LOW (ref 36.0–46.0)
Hemoglobin: 9 g/dL — ABNORMAL LOW (ref 12.0–15.0)
MCH: 28.8 pg (ref 26.0–34.0)
MCHC: 35 g/dL (ref 30.0–36.0)
MCV: 82.1 fL (ref 80.0–100.0)
Platelets: 195 K/uL (ref 150–400)
RBC: 3.13 MIL/uL — ABNORMAL LOW (ref 3.87–5.11)
RDW: 19.1 % — ABNORMAL HIGH (ref 11.5–15.5)
WBC: 1.6 K/uL — ABNORMAL LOW (ref 4.0–10.5)
nRBC: 0 % (ref 0.0–0.2)

## 2021-01-10 LAB — PROCALCITONIN: Procalcitonin: 71.6 ng/mL

## 2021-01-10 LAB — POTASSIUM: Potassium: 3.2 mmol/L — ABNORMAL LOW (ref 3.5–5.1)

## 2021-01-10 LAB — MAGNESIUM: Magnesium: 1.9 mg/dL (ref 1.7–2.4)

## 2021-01-10 LAB — HIV ANTIBODY (ROUTINE TESTING W REFLEX): HIV Screen 4th Generation wRfx: NONREACTIVE

## 2021-01-10 IMAGING — DX DG CHEST 1V
1 series · 1 of 1 positions shown · non-contrast
Comparison: Chest x-ray [DATE].

CLINICAL DATA: 70-year-old female with history of shortness of
breath.

EXAM:
CHEST  1 VIEW

[chest ap]
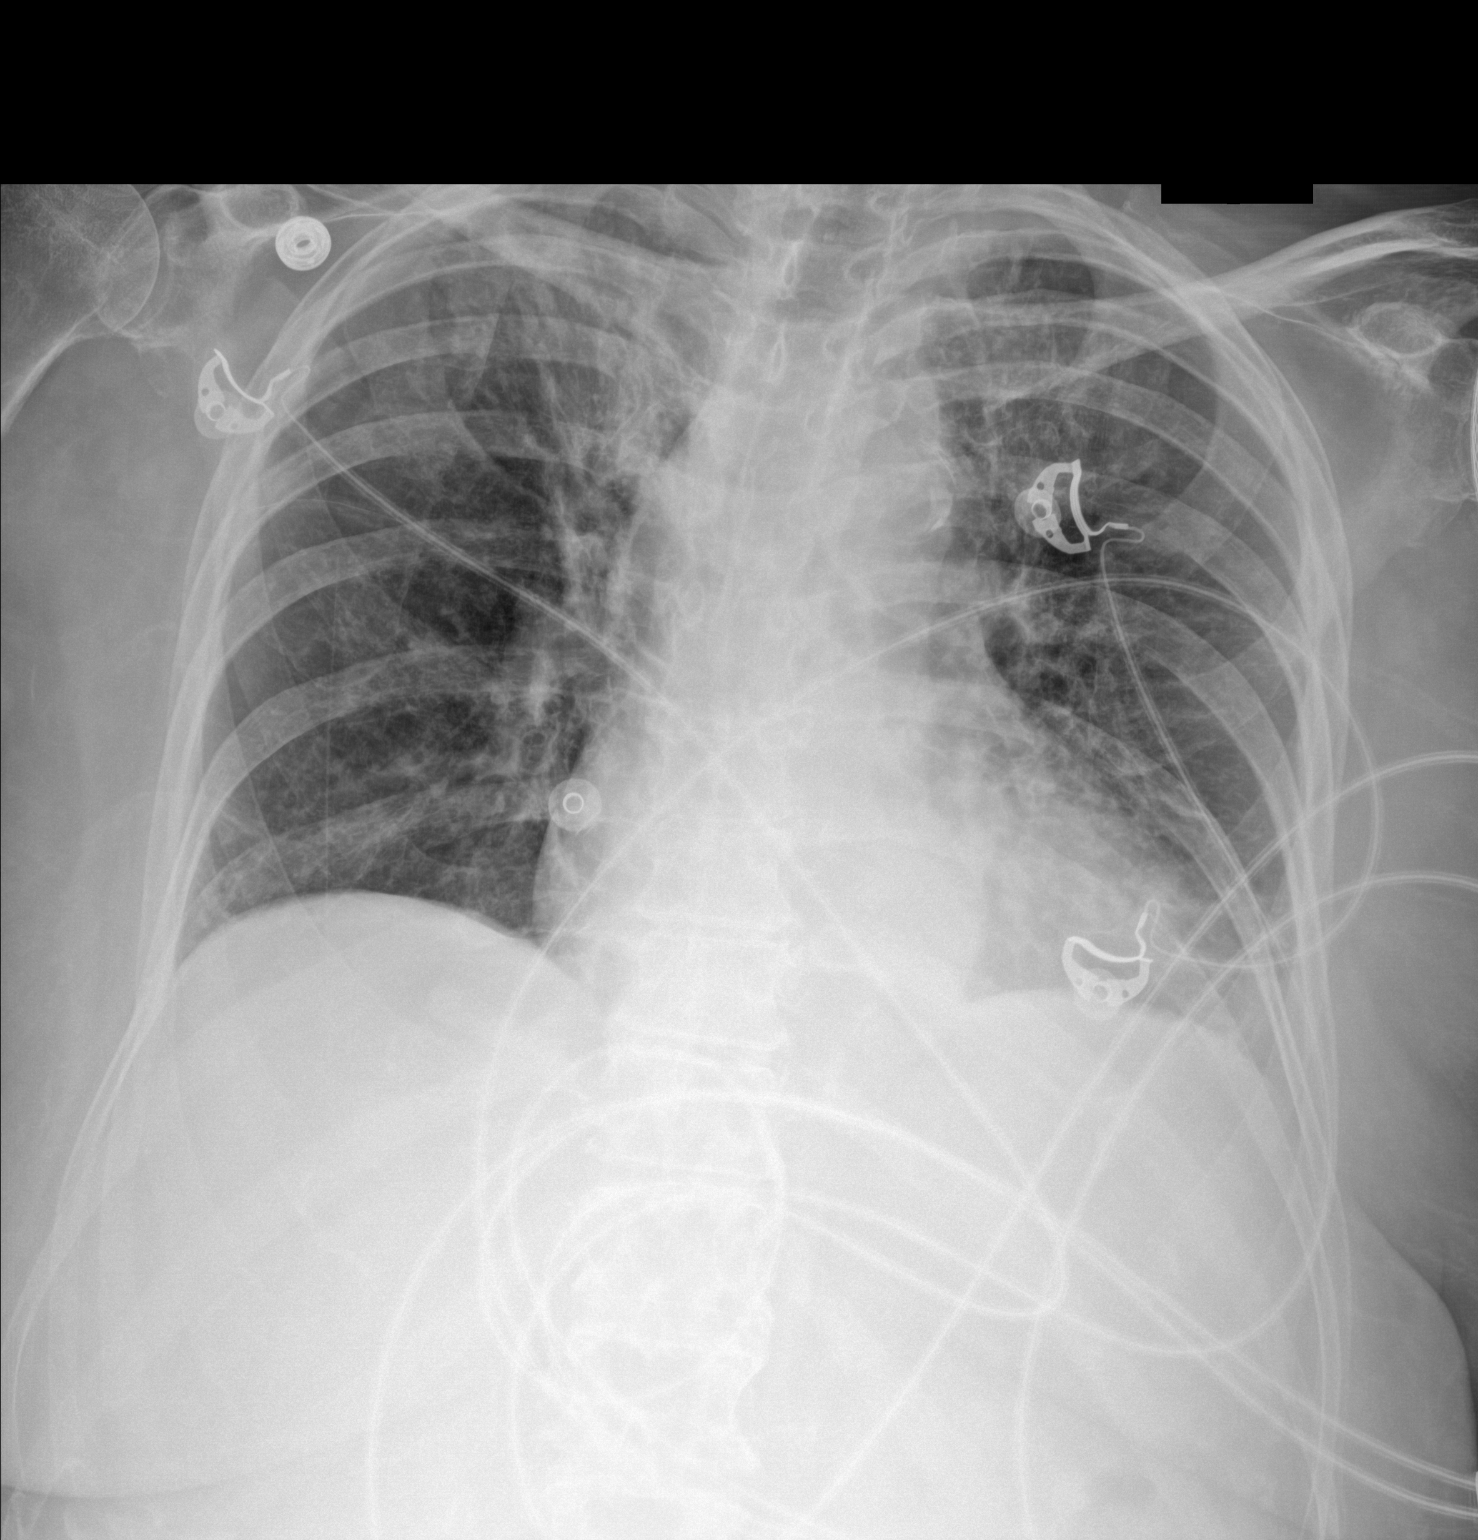

[1 of 1 positions shown; findings below may reference images not displayed]

FINDINGS: Lung volumes are low. Persistent ill-defined opacity in the medial
aspect of the right upper lobe, unchanged. Left lung appears clear.
Known small bilateral pleural effusions are not readily apparent on
this single AP view. No pneumothorax. No evidence of pulmonary
edema. Heart size is borderline enlarged. Atherosclerotic
calcifications in the thoracic aorta.
IMPRESSION: 1. Persistent medial right upper lobe airspace consolidation
concerning for pneumonia. Followup PA and lateral chest X-ray is
recommended in 3-4 weeks following trial of antibiotic therapy to
ensure resolution and exclude underlying malignancy.
2. Aortic atherosclerosis.
3. Small bilateral pleural effusions noted on yesterday's CT the
abdomen and pelvis are not readily apparent on today's plain film
examination.

## 2021-01-10 IMAGING — US US EXTREM LOW VENOUS
1 series · 13 of 24 positions shown · non-contrast
Comparison: None.

CLINICAL DATA: Bilateral lower extremity pain and edema. Evaluate
for DVT.



[Series 1: us venous img lower bilat (dvt) · portal-venous · 13 of 51 slices shown]
[im 1/51]
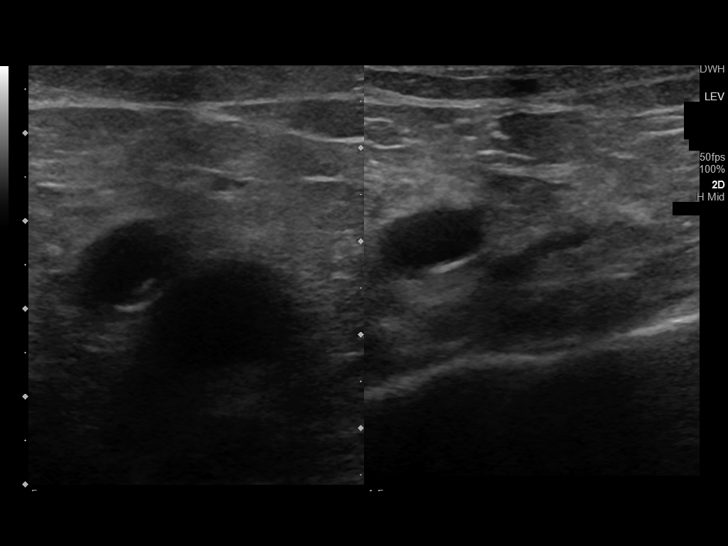
[im 5/51]
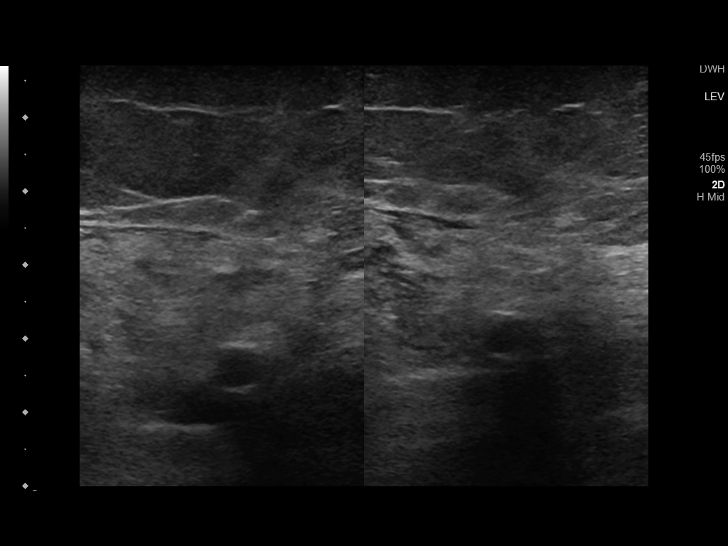
[im 9/51]
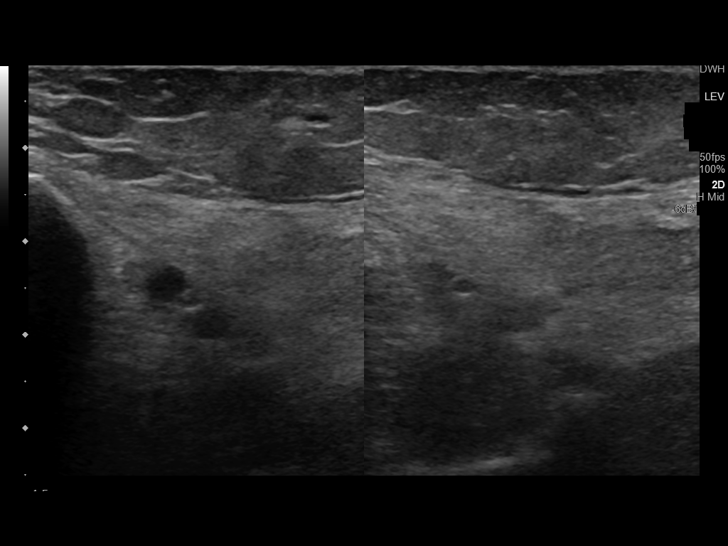
[im 14/51]
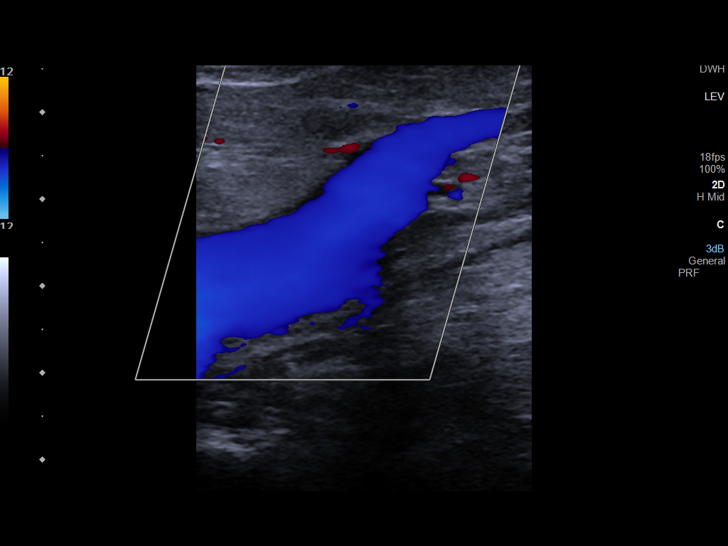
[im 18/51]
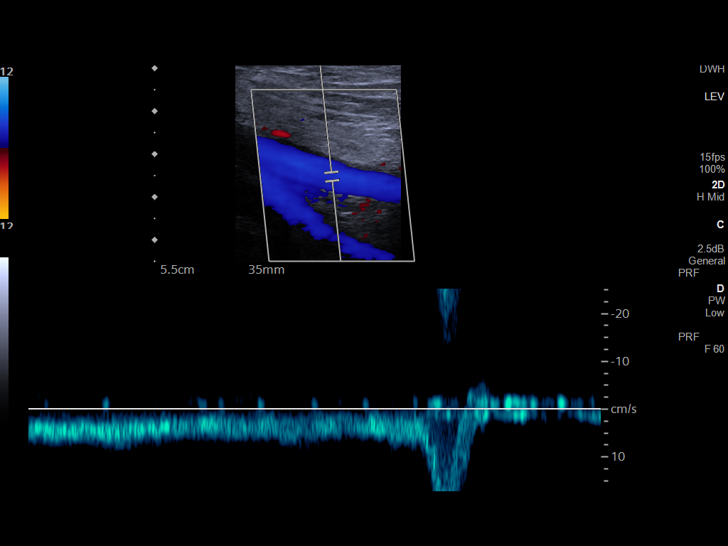
[im 22/51]
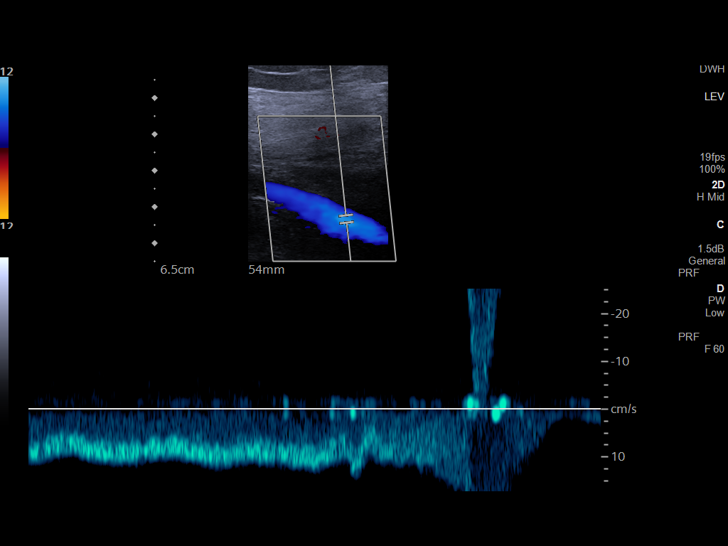
[im 27/51]
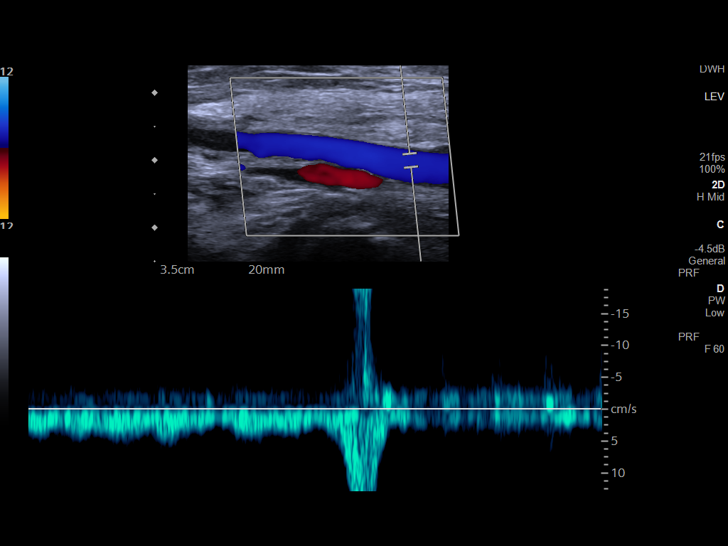
[im 29/51]
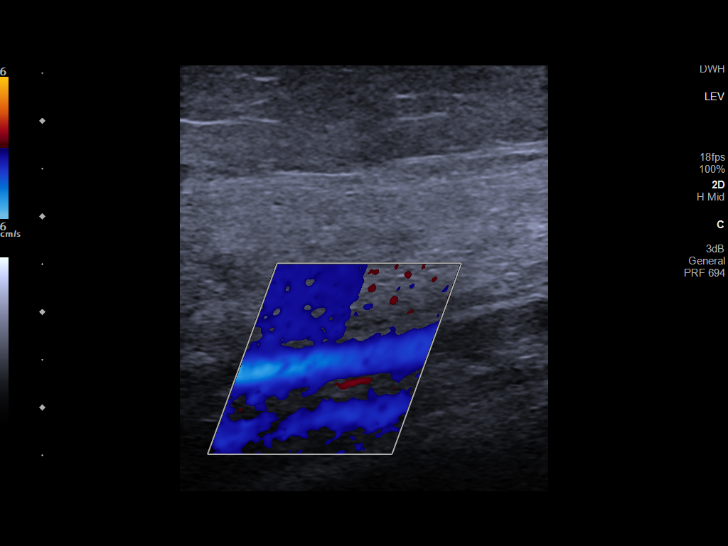
[im 33/51]
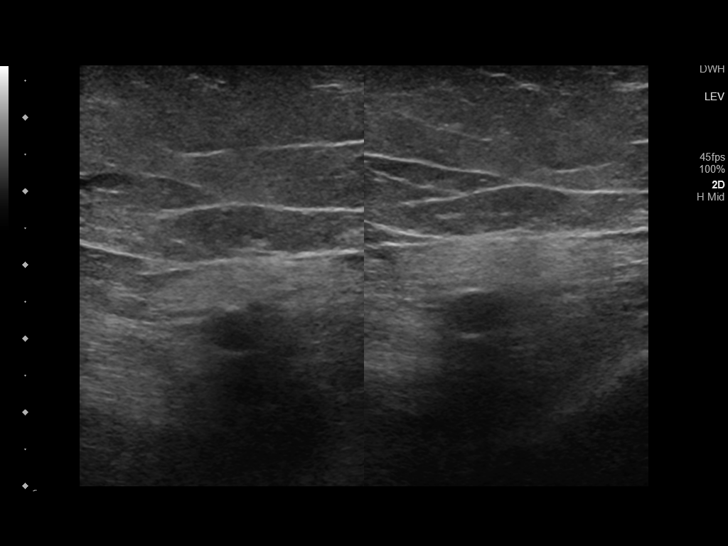
[im 37/51]
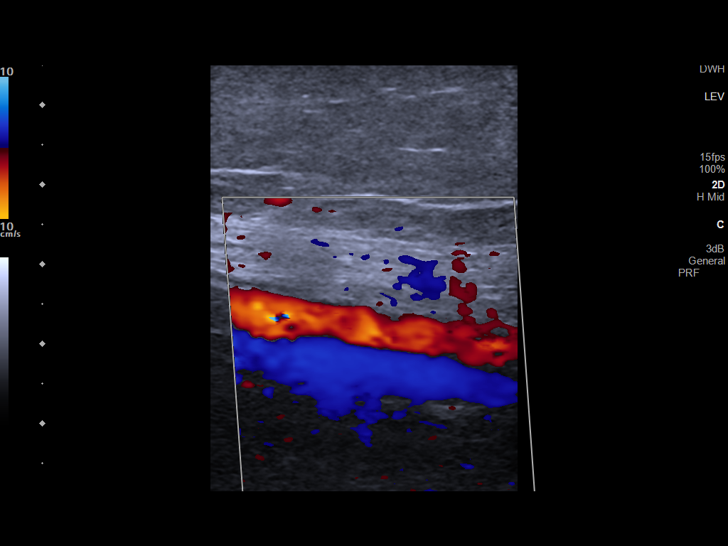
[im 42/51]
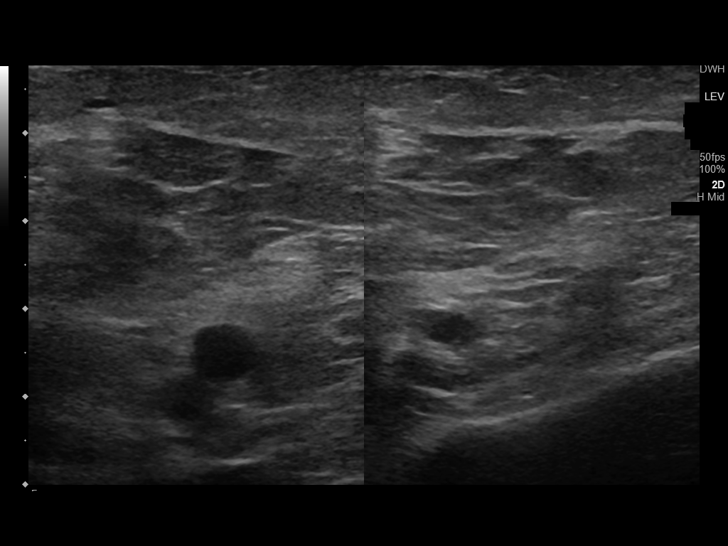
[im 46/51]
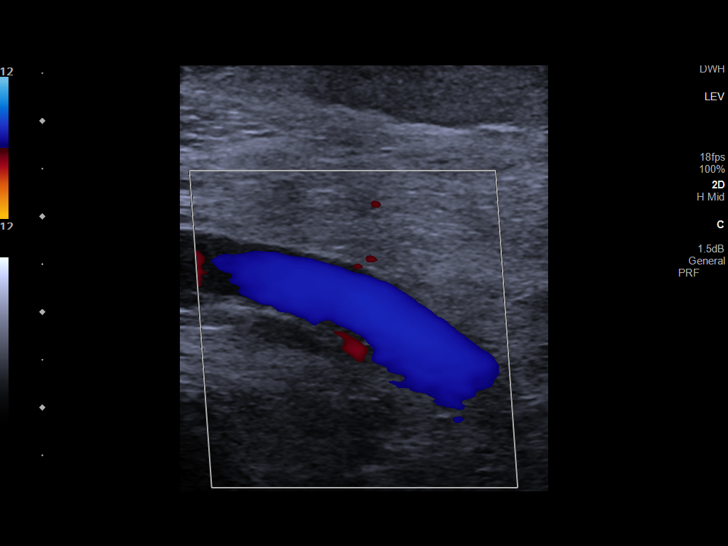
[im 51/51]
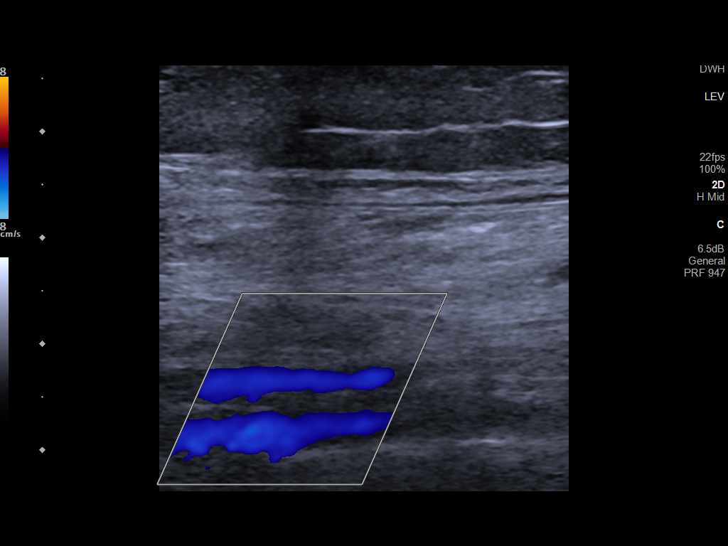

[13 of 24 positions shown; findings below may reference images not displayed]

FINDINGS: RIGHT LOWER EXTREMITY

Common Femoral Vein: No evidence of thrombus. Normal
compressibility, respiratory phasicity and response to augmentation.

Saphenofemoral Junction: No evidence of thrombus. Normal
compressibility and flow on color Doppler imaging.

Profunda Femoral Vein: No evidence of thrombus. Normal
compressibility and flow on color Doppler imaging.

Femoral Vein: No evidence of thrombus. Normal compressibility,
respiratory phasicity and response to augmentation.

Popliteal Vein: No evidence of thrombus. Normal compressibility,
respiratory phasicity and response to augmentation.

Calf Veins: No evidence of thrombus. Normal compressibility and flow
on color Doppler imaging.

Superficial Great Saphenous Vein: No evidence of thrombus. Normal
compressibility.

Venous Reflux:  None.

Other Findings:  None.

LEFT LOWER EXTREMITY

Common Femoral Vein: Not visualized secondary to bandage associated
with left femoral approach central venous catheter.

Saphenofemoral Junction: Not visualized secondary to bandages so
shaded with left femoral approach central venous catheter.

Profunda Femoral Vein: No evidence of thrombus. Normal
compressibility and flow on color Doppler imaging.

Femoral Vein: No evidence of thrombus. Normal compressibility,
respiratory phasicity and response to augmentation.

Popliteal Vein: No evidence of thrombus. Normal compressibility,
respiratory phasicity and response to augmentation.

Calf Veins: No evidence of thrombus. Normal compressibility and flow
on color Doppler imaging.

Superficial Great Saphenous Vein: No evidence of thrombus. Normal
compressibility.

Venous Reflux:  None.

Other Findings:  None.
IMPRESSION: No evidence of DVT within either lower extremity.

## 2021-01-10 MED ORDER — POTASSIUM CHLORIDE 10 MEQ/50ML IV SOLN
10.0000 meq | INTRAVENOUS | Status: AC
  Administered 2021-01-10 – 2021-01-11 (×2): 10 meq via INTRAVENOUS
  Filled 2021-01-10 (×2): qty 50

## 2021-01-10 MED ORDER — POTASSIUM PHOSPHATES 15 MMOLE/5ML IV SOLN
15.0000 mmol | Freq: Once | INTRAVENOUS | Status: AC
  Administered 2021-01-10: 15 mmol via INTRAVENOUS
  Filled 2021-01-10: qty 5

## 2021-01-10 MED ORDER — POTASSIUM CHLORIDE 20 MEQ PO PACK
40.0000 meq | PACK | Freq: Once | ORAL | Status: AC
  Administered 2021-01-10: 40 meq via ORAL
  Filled 2021-01-10: qty 2

## 2021-01-10 MED ORDER — LACTATED RINGERS IV BOLUS
500.0000 mL | Freq: Once | INTRAVENOUS | Status: AC
  Administered 2021-01-10: 500 mL via INTRAVENOUS

## 2021-01-10 MED ORDER — AMIODARONE LOAD VIA INFUSION
150.0000 mg | Freq: Once | INTRAVENOUS | Status: AC
  Administered 2021-01-10: 150 mg via INTRAVENOUS
  Filled 2021-01-10: qty 83.34

## 2021-01-10 MED ORDER — POTASSIUM CHLORIDE 10 MEQ/100ML IV SOLN
10.0000 meq | INTRAVENOUS | Status: AC
Start: 2021-01-10 — End: 2021-01-11
  Administered 2021-01-10 (×2): 10 meq via INTRAVENOUS
  Filled 2021-01-10 (×2): qty 100

## 2021-01-10 MED ORDER — MAGNESIUM SULFATE 2 GM/50ML IV SOLN
2.0000 g | Freq: Once | INTRAVENOUS | Status: AC
  Administered 2021-01-10: 2 g via INTRAVENOUS
  Filled 2021-01-10: qty 50

## 2021-01-10 MED ORDER — ORAL CARE MOUTH RINSE
15.0000 mL | Freq: Two times a day (BID) | OROMUCOSAL | Status: DC
  Administered 2021-01-10 – 2021-01-15 (×8): 15 mL via OROMUCOSAL

## 2021-01-10 MED ORDER — CHLORHEXIDINE GLUCONATE CLOTH 2 % EX PADS
6.0000 | MEDICATED_PAD | Freq: Every day | CUTANEOUS | Status: DC
  Administered 2021-01-10 – 2021-01-15 (×6): 6 via TOPICAL

## 2021-01-10 MED ORDER — METOPROLOL TARTRATE 5 MG/5ML IV SOLN
2.5000 mg | Freq: Once | INTRAVENOUS | Status: AC
  Administered 2021-01-10: 2.5 mg via INTRAVENOUS
  Filled 2021-01-10: qty 5

## 2021-01-10 MED ORDER — POTASSIUM CHLORIDE CRYS ER 20 MEQ PO TBCR
40.0000 meq | EXTENDED_RELEASE_TABLET | Freq: Once | ORAL | Status: AC
  Administered 2021-01-10: 40 meq via ORAL
  Filled 2021-01-10: qty 2

## 2021-01-10 MED ORDER — LEVOTHYROXINE SODIUM 50 MCG PO TABS
75.0000 ug | ORAL_TABLET | Freq: Every day | ORAL | Status: DC
  Administered 2021-01-11 – 2021-01-15 (×5): 75 ug via ORAL
  Filled 2021-01-10 (×6): qty 2

## 2021-01-10 MED ORDER — POTASSIUM CHLORIDE 10 MEQ/50ML IV SOLN
10.0000 meq | INTRAVENOUS | Status: AC
  Administered 2021-01-10 (×2): 10 meq via INTRAVENOUS
  Filled 2021-01-10 (×2): qty 50

## 2021-01-10 NOTE — Progress Notes (Signed)
NAME:  Lauren Bradshaw, MRN:  [name], DOB:  28-Aug-1950, LOS: 0 ADMISSION DATE:  01/01/2021 CONSULTATION DATE:  01/03/2021             REFERRING MD:  Dr. Kerman Passey CHIEF COMPLAINT:  Weakness, lethargy and abdominal pain     BRIEF SYNOPSIS:  [name] is a 70 y/o F who presented to [facility] ED for weakness, lethargy, diarrhea, and abdominal pain after recent d/c from rehab for c.diff, Afib, PNA, and ANCA renal vasculitis. Patient placed in ICU care for hemodynamic instability secondary to hypovolemic septic shock d/t pancolitis with multiorgan failure with resp distress, renal failure, cardiac failure with afib, severe leukopenia with severe metabolic acidosis and encephalopathy.   History of Present Illness:  Lauren Bradshaw is a 70 year old female with a history of atrial fibrillation, c. Diff., HTN, HDL, and vasculitis who presents to [facility] ED with complaints of progressive weakness and lethargy. 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 Diff. (She was meant to FU with both renal and pulmonary specialists this week to determine further plan of care.)   She was discharged to her son [name]) and daughter-in-law's [name]) home able to pivot transfer and was doing quite well. She was reported to have formed stools upon discharge. However, over the weekend, she developed severe diarrhea, abdominal pain, and progressive weakness and lethargy. She had decreased oral intake of both food and water. Patient was unable to get out of bed, EMS was called. She was transferred to [facility] and not back to [facility] d/t positive stroke screen for slurred speech. Code stroke was activated.    Pertinent  Medical History  Atrial fibrillation  C. Diff HTN HDL ANCA renal  vasculitis  PNA w/ respiratory failure  Hypothyroidism    Significant Hospital Events: Including procedures, antibiotic start and stop dates in addition to other pertinent events   ED course: Code stroke activated, CTH obtained, but further testing deferred d/t hemodynamic instability. Patient was given amiodarone bolus for AFIB w/ RVR.    10/31>> The critical care team was consulted for hemodynamic instability. Patient was seen in ED bed, appears to be critically ill - complaining of abdominal pain, CT abdomen/pelvis ordered. Will be transferred to critical care for further workup and treatment.  11/1>> CT abdomen/pelvis confirms pancolitis.      Micro Data:  10/31>> Blood culture pending  10/31>> Stool PCR - positive for C.Diff    Antimicrobials:  10/31>> metronidazole, vancomycin, cefepime  11/1>> metronidazole, PO vancomycin    Interim History / Subjective:  Patient appears improved and states she feels better today; although still critically ill with fever overnight. Treatment for pancolitis & sepsis with IV/PO abx to avoid total colectomy.      Objective   Blood pressure (!) 106/57, pulse (!) 124, temperature (!) 97.5 F (36.4 C), temperature source Oral, resp. rate (!) 32, height 5\' 4"  (1.626 m), weight 54.7 kg, SpO2 97 %.    >         Intake/Output Summary (Last 24 hours) at 12/11/2020 1642 Last data filed at 01/07/2021 1639    Gross per 24 hour  Intake 2500.67 ml  Output --  Net 2500.67 ml       Filed Weights    01/08/2021 1013  Weight: 54.7 kg      REVIEW OF SYSTEMS Review of Systems  Constitutional:  Positive for malaise/fatigue (Feels better today).  Respiratory:  Positive for cough. Negative for shortness of breath.   Cardiovascular:  Negative for chest pain.  Gastrointestinal:  Positive for abdominal pain. Negative for nausea and vomiting.  Neurological:  Negative for headaches.  Endo/Heme/Allergies:  Bruises/bleeds easily.    PHYSICAL  EXAMINATION:   GENERAL: Ill appearing, but improved from yesterday; pallor.  EYES: Pupils equal, round, reactive to light. No scleral icterus. 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  PULMONARY: Tachypnea. Symmetric chest wall movement. Coarse lung sounds R upper lobe. Good airflow.  CARDIOVASCULAR: Atrial fibrillation w/ RVR. No MRG. GASTROINTESTINAL: Severe tenderness to palpation and percussion. Moderately distended. Positive bowel sounds.  MUSCULOSKELETAL: 1+ pitting edema BLE. NEUROLOGIC: A&Ox4. Generalized weakness, but no focal neurologic deficits.  SKIN: Diffuse petechia BUE, minimal petechia BLE.    Labs/imaging that I havepersonally reviewed  (right click and "Reselect all SmartList Selections" daily)  10/31>> CTH- There is no acute intracranial hemorrhage or evidence of acute infarction. ASPECT score is 10. 10/31 >> CXR- New airspace opacity in the right suprahilar lung likely due to pneumonia. Radiographic follow-up to resolution is recommended to exclude developing mass. 10/31 >> CT abdomen/pelvis - pancolitis. Left renal subscapular fluid collection, likely hematoma d/t recent renal biopsy.  11/1>> CXR- stable 11/1>> [place] BLE- No evidence of DVT within either lower extremity. 11/1>> Echo- results pending     Resolved Hospital Problem list       ASSESSMENT AND PLAN   SYNOPSIS: Lauren Bradshaw is a 70 y/o F who presented to [facility] ED for weakness, lethargy, diarrhea, and abdominal pain after recent d/c from rehab for c.diff, Afib, PNA, and ANCA renal vasculitis. Patient placed in ICU care for hemodynamic instability secondary to hypovolemic septic shock d/t pancolitis with multiorgan failure with resp distress, renal failure, cardiac failure with afib, hypoglycemia, severe leukopenia with severe metabolic acidosis and encephalopathy.   #Hypovolemic shock   #Septic shock #Metabolic acidosis with respiratory compensation (resolved) - Source: c. Diff  pancolitis  - Aggressive fluid resuscitation-  LR   - Sepsis antimicrobials (empiric): metronidazole, vancomycin, cefepime  - Stop Zosyn start PO vanc & flagyl - Use vasopressors as needed to keep MAP>65 (not currently needing pressors)  - Trend CBC, procal, lactate, and VBGs; follow up cultures    #Abdominal pain  - Extreme tenderness to palpation and percussion  - CT abdomen/pelvis w/ contrast -pancolitis, renal hematoma, no abscess, no perforation - Per surgery - will try to treat pancolitis with aggressive abx to avoid surgery. 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 Will continue to trend lactate, if continues to elevate or be significantly elevated, will plan for urgent surgery.  - Zofran & fentanyl as needed    #Electrolyte derangement  #Hyponatremia  #Hypokalemia  - Trend labs  - Pharmacy consultation, replace (phos, mag, K+) as needed  #Leukopenia  #Neutropenia  #Immunosuppression  #Acute anemia  - Consult heme/oncolgy  - Trend WBCs  - Leukopenia could be due to recent chemo meds  - Anemia may be d/t intraabdominal hematoma  - Neutropenic precautions   #ACUTE KIDNEY INJURY/Renal Failure -continue Foley Catheter-assess need -Avoid nephrotoxic agents -Follow urine output, BMP -Ensure adequate renal perfusion, optimize oxygenation -Renal dose medications    Intake/Output Summary (Last 24 hours) at 01/01/2021 1657 Last data filed at 01/07/2021 1639    Gross per 24  hour  Intake 2500.67 ml  Output --  Net 2500.67 ml    BMP Latest Ref Rng & Units 12/23/2020  Glucose 70 - 99 mg/dL 59(L)  BUN 8 - 23 mg/dL 28(H)  Creatinine 0.44 - 1.00 mg/dL 1.70(H)  Sodium 135 - 145 mmol/L 128(L)  Potassium 3.5 - 5.1 mmol/L 3.3(L)  Chloride 98 - 111 mmol/L 101  CO2 22 - 32 mmol/L 14(L)  Calcium 8.9 - 10.3 mg/dL 7.6(L)      #C. Diff  - C. Diff PCR positive  - PO vanc & metronidazole    #Afib w/ RVR  - No current anticoagulation, was on Eliquis, but was  discontinued several weeks ago d/t downtrending H&H.  - Amiodarone drip 60mg /hr for rate control  - Dose Metoprolol (2.5mg ) for rate control   #Hypothyroidism  - Start home dose levothyroxine 75 mcg once a day     Best practice (right click and "Reselect all SmartList Selections" daily)  Diet: NPO Pain/Anxiety/Delirium protocol (if indicated): No VAP protocol (if indicated): Not indicated DVT prophylaxis: Contraindicated GI prophylaxis: N/A Glucose control:  SSI No Central venous access:  N/A Arterial line:  N/A Foley:  N/A Mobility:  bed rest  Code Status:  FULL Disposition:ICU     DVT/GI PRX  assessed I Assessed the need for Labs I Assessed the need for Foley I Assessed the need for Central Venous Line Family Discussion when available I Assessed the need for Mobilization I made an Assessment of medications to be adjusted accordingly Safety Risk assessment completed  CASE DISCUSSED IN MULTIDISCIPLINARY ROUNDS WITH ICU TEAM     Critical Care Time devoted to patient care services described in this note is 55 minutes.  Critical care was necessary to treat /prevent imminent and life-threatening deterioration. Overall, patient is critically ill, prognosis is guarded.  Patient with Multiorgan failure and at high risk for cardiac arrest and death.    Corrin Parker, M.D.  [facility]  Medical Director [place] Director [facility] Cardio-Pulmonary Department

## 2021-01-10 NOTE — Progress Notes (Addendum)
PHARMACY CONSULT NOTE - FOLLOW UP  Pharmacy Consult for Electrolyte Monitoring and Replacement   Recent Labs: Potassium (mmol/L)  Date Value  01/10/2021 2.3 (LL)   Magnesium (mg/dL)  Date Value  32/20/2542 1.9   Calcium (mg/dL)  Date Value  70/62/3762 6.6 (L)   Albumin (g/dL)  Date Value  83/15/1761 2.0 (L)   Phosphorus (mg/dL)  Date Value  60/73/7106 2.3 (L)   Sodium (mmol/L)  Date Value  01/10/2021 133 (L)   Corr Ca: 8.88 mg/dL  Assessment: 70 y.o. female who presents emergency department for generalized fatigue/weakness. Pharmacy has been consulted for electrolyte replacement.   On amiodarone gtt, on NE (in Afib) Qtc (baz) w VR of145  Scr: 1.7>1.65 Na: 128>133 K: 3.3>2.3 Mg: 2.4>1.9 Phos: 3.9>2.3  Goal of Therapy:  K 4 - 5.1 mmol/L Mg 2 - 2.4 mg/dL All other electrolytes WNL  Plan:  Na 128>133 - will defer to primary team K 3.3>2.3  - Pt refused ALL KCL oral repletion  on 10/31 (no doses recieved) 11/01 AM received PO x1 (confirmed) and KCL IV q1h x2 (running) Phos 2.3: will replete with IV Kphos x1 (supplies of K+) Mg downtrending. Given low K+ will give MgSO4 IV 2g x1 Recheck electrolytes with AM labs  Martyn Malay ,PharmD,[facility] Clinical Pharmacist 01/10/2021 8:56 AM

## 2021-01-10 NOTE — Progress Notes (Signed)
PHARMACY CONSULT NOTE - FOLLOW UP  Pharmacy Consult for Electrolyte Monitoring and Replacement   Recent Labs: Potassium (mmol/L)  Date Value  01/10/2021 3.2 (L)   Magnesium (mg/dL)  Date Value  58/59/2924 1.9   Calcium (mg/dL)  Date Value  46/28/6381 6.7 (L)   Albumin (g/dL)  Date Value  77/01/6578 1.6 (L)   Phosphorus (mg/dL)  Date Value  03/83/3383 2.9   Sodium (mmol/L)  Date Value  01/10/2021 132 (L)   Corr Ca: 8.88 mg/dL  Assessment: 70 y.o. female who presents emergency department for generalized fatigue/weakness. Pharmacy has been consulted for electrolyte replacement.   On amiodarone gtt, on NE (in Afib) Qtc (baz) w VR of145  Scr: 1.7>1.65>1.61 Na: 291>916>606 K: 3.3>2.3>2.7>3.2 Mg: 2.4>1.9 Phos: 3.9>2.3  Goal of Therapy:  K 4 - 5.1 mmol/L Mg 2 - 2.4 mg/dL All other electrolytes WNL  Plan:  Defer management of hyponatremia to PCCM PM re-check K = 3.2. Will give another IV KCl 10 mEq x 2 runs Phos 2.3: will replete with IV Kphos x1 (supplies of K+) [completed] Mg downtrending. Given low K+ will give MgSO4 IV 2g x1 [completed] Recheck electrolytes with AM labs  [name] 01/10/2021 9:36 PM

## 2021-01-10 NOTE — H&P (Signed)
NAME:  Lauren Bradshaw, MRN:  [id number], DOB:  May 14, 1950, LOS: 0 ADMISSION DATE:  12/22/2020 CONSULTATION DATE:  12/13/2020             REFERRING MD:  Dr. Lenard Lance CHIEF COMPLAINT:  Weakness, lethargy and abdominal pain     BRIEF SYNOPSIS:  Lauren Bradshaw is a 70 y/o F who presented to [facility] ED for weakness, lethargy, diarrhea, and abdominal pain after recent d/c from rehab for c.diff, Afib, PNA, and ANCA renal vasculitis. Patient placed in ICU care for hemodynamic instability secondary to hypovolemic septic shock d/t pancolitis with multiorgan failure with resp distress, renal failure, cardiac failure with afib, severe leukopenia with severe metabolic acidosis and encephalopathy   History of Present Illness:  Lauren Bradshaw is a 70 year old female with a history of atrial fibrillation, c. Diff., HTN, HDL, and vasculitis who presents to [facility] ED with complaints of progressive weakness and lethargy. 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 Diff. (She was meant to FU with both renal and pulmonary specialists this week to determine further plan of care.)   She was discharged to her son Lauren Bradshaw) and daughter-in-law's Lauren Bradshaw) home able to pivot transfer and was doing quite well. She was reported to have formed stools upon discharge. However, over the weekend, she developed severe diarrhea, abdominal pain, and progressive weakness and lethargy. She had decreased oral intake of both food and water. Patient was unable to get out of bed, EMS was called. She was transferred to [facility] and not back to [facility] d/t positive stroke screen for slurred speech. Code stroke was activated.    Pertinent  Medical History  Atrial fibrillation  C. Diff HTN HDL ANCA renal vasculitis   PNA w/ respiratory failure    Significant Hospital Events: Including procedures, antibiotic start and stop dates in addition to other pertinent events   ED course: Code stroke activated, CTH obtained, but further testing deferred d/t hemodynamic instability. Patient was given amiodarone bolus for AFIB w/ RVR.    10/31>> The critical care team was consulted for hemodynamic instability. Patient was seen in ED bed, appears to be critically ill - complaining of abdominal pain, CT abdomen/pelvis ordered. Will be transferred to critical care for further workup and treatment.      Micro Data:  10/31>> Blood culture pending  10/31>> Stool PCR pending    Antimicrobials:  10/31>> metronidazole, vancomycin, cefepime    Interim History / Subjective:  The critical care team was consulted for hemodynamic instability. Patient was seen in ED bed, appears to be critically ill - complaining of abdominal pain, CT abdomen/pelvis ordered. Will be transferred to critical care for further workup and treatment.    Objective   Blood pressure (!) 106/57, pulse (!) 124, temperature (!) 97.5 F (36.4 C), temperature source Oral, resp. rate (!) 32, height 5\' 4"  (1.626 m), weight 54.7 kg, SpO2 97 %.    >         Intake/Output Summary (Last 24 hours) at 12/20/2020 1642 Last data filed at 12/28/2020 1639    Gross per 24 hour  Intake 2500.67 ml  Output --  Net 2500.67 ml       Filed Weights    12/21/2020 1013  Weight: 54.7 kg  REVIEW OF SYSTEMS Review of Systems  Constitutional:  Positive for malaise/fatigue and weight loss (20 lb weight loss since 9/21).  Respiratory:  Positive for cough. Negative for shortness of breath.   Cardiovascular:  Negative for chest pain.  Gastrointestinal:  Positive for abdominal pain, diarrhea and nausea. Negative for blood in stool and vomiting.  Neurological:  Negative for headaches.  Endo/Heme/Allergies:  Bruises/bleeds easily.     PHYSICAL EXAMINATION:    GENERAL: critically ill appearing, pallor.  EYES: Pupils equal, round, reactive to light. No scleral icterus. 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  PULMONARY: Tachypnea. Symmetric chest wall movement. Coarse lung sounds R upper lobe. Good airflow.  CARDIOVASCULAR: Atrial fibrillation w/ RVR. No MRG. GASTROINTESTINAL: Severe tenderness to palpation and percussion. Moderately distended. Positive bowel sounds.  MUSCULOSKELETAL: 1+ pitting edema BLE. NEUROLOGIC: A&Ox4. Generalized weakness, but no focal neurologic deficits.  SKIN: Diffuse petechia BUE, minimal petechia BLE.    Labs/imaging that I havepersonally reviewed  (right click and "Reselect all SmartList Selections" daily)  10/31 >> CXR- New airspace opacity in the right suprahilar lung likely due to pneumonia. Radiographic follow-up to resolution is recommended to exclude developing mass. 10/31 >> CT abdomen/pelvis - pancolitis. Left renal subscapular fluid collection, likely hematoma d/t recent renal biopsy.    Resolved Hospital Problem list       ASSESSMENT AND PLAN   SYNOPSIS: Lauren MeigsJoni is a 70 y/o F who [facility] ED for weakness, lethargy, diarrhea, and abdominal pain after recent d/c from rehab for c.diff, Afib, PNA, and ANCA renal vasculitis. Patient placed in ICU care for hemodynamic instability secondary to hypovolemic septic shock d/t pancolitis with multiorgan failure with resp distress, renal failure, cardiac failure with afib, hypoglycemia, severe leukopenia with severe metabolic acidosis and encephalopathy   #Hypovolemic shock  #SIRS + Source of infection = sepsis  #Septic shock #Metabolic acidosis with respiratory compensation  - Source: recent c.diff infection vs. Intraabdominal abscess  - Aggressive fluid resuscitation-  LR bolus  - Bicarb infusion  - Sepsis antimicrobials (empiric): metronidazole, vancomycin, cefepime  - Start Zosyn  - use vasopressors as needed to keep  MAP>65  - Trend CBC, procal, lactate, and VBGs; follow up cultures    #Abdominal pain  - Extreme tenderness to palpation and percussion  - CT abdomen/pelvis w/ contrast -pan colitis, renal hematoma, no abscess, no perforation - Consult surgery/IR - possible surgical abdomen  - Zofran as needed    #Electrolyte derangement  #Hyponatremia  #Hypokalemia  - Trend labs  - Pharmacy consultation , replace as needed - LR + sodium bicarb for hyponatremia sufficient     #Leukopenia  #Immunosuppression  #Acute anemia  - Trend WBCs  - Leukopenia could be due to recent chemo meds  - Anemia may be d/t intraabdominal hematoma  - Neutropenic precautions   #ACUTE KIDNEY INJURY/Renal Failure -continue Foley Catheter-assess need -Avoid nephrotoxic agents -Follow urine output, BMP -Ensure adequate renal perfusion, optimize oxygenation -Renal dose medications     Intake/Output Summary (Last 24 hours) at 2020-11-20 1657 Last data filed at 2020-11-20 1639    Gross per 24 hour  Intake 2500.67 ml  Output --  Net 2500.67 ml    BMP Latest Ref Rng & Units 2020-11-20  Glucose 70 - 99 mg/dL 16(X59(L)  BUN 8 - 23 mg/dL 09(U28(H)  Creatinine 0.450.44 - 1.00 mg/dL 4.09(W1.70(H)  Sodium 119135 - 147145 mmol/L 128(L)  Potassium 3.5 - 5.1 mmol/L 3.3(L)  Chloride 98 - 111  mmol/L 101  CO2 22 - 32 mmol/L 14(L)  Calcium 8.9 - 10.3 mg/dL 7.6(L)      #C. Diff  - C. Diff PCR pending    #Afib w/ RVR  - No current anticoagulation, was on Eliquis, but was discontinued several weeks ago d/t downtrending H&H.  - Amiodarone bolus in ED for rate control      Best practice (right click and "Reselect all SmartList Selections" daily)  Diet: NPO Pain/Anxiety/Delirium protocol (if indicated): No VAP protocol (if indicated): Not indicated DVT prophylaxis: Contraindicated GI prophylaxis: N/A Glucose control:  SSI No Central venous access:  N/A Arterial line:  N/A Foley:  N/A Mobility:  bed rest  Code Status:   FULL Disposition:ICU    DVT/GI PRX  assessed I Assessed the need for Labs I Assessed the need for Foley I Assessed the need for Central Venous Line Family Discussion when available I Assessed the need for Mobilization I made an Assessment of medications to be adjusted accordingly Safety Risk assessment completed  CASE DISCUSSED IN MULTIDISCIPLINARY ROUNDS WITH ICU TEAM     Critical Care Time devoted to patient care services described in this note is 75 minutes.  Critical care was necessary to treat /prevent imminent and life-threatening deterioration. Overall, patient is critically ill, prognosis is guarded.  Patient with Multiorgan failure and at high risk for cardiac arrest and death.    Corrin Parker, M.D.  [facility]  Medical Director [place] Director [facility] Cardio-Pulmonary Department

## 2021-01-10 NOTE — Progress Notes (Signed)
*  PRELIMINARY RESULTS* Echocardiogram 2D Echocardiogram has been performed.  Lauren Bradshaw 01/10/2021, 8:51 AM

## 2021-01-10 NOTE — Progress Notes (Signed)
[facility] SURGICAL PROGRESS NOTE (cpt [phone])  Hospital Day(s): 1.   Interval History: Patient seen and examined. Overnight, she continues to have fevers to 101.40F (T-Max) at 0430. She continues to be leukopenic this morning to 1.6K. Renal function continues to be elevated, sCr - 1.65; UO - 60. Hypokalemia to 2.3. Lactic acid levels are pending this morning, 6.4 at 2200 yesterday (10/31). This morning, she reports that she believes she is feeling better but still having some degree of abdominal pain. Appears worse on the left this morning. No significant distension. No nausea or emesis. She continues to remain in afib with RVR, on amiodarone, and somewhat tachypneic this morning. She did have norepinephrine last night but this was stopped this morning. She continues on IV Flagyl and PO Vancomycin   Review of Systems:  Constitutional: + fever HEENT: denies cough or congestion  Respiratory: denies any shortness of breath  Cardiovascular: denies chest pain or palpitations  Gastrointestinal: + abdominal pain, denied nausea, emesis Genitourinary: denies burning with urination or urinary frequency  Vital signs in last 24 hours: [min-max] current  Temp:  [97.5 F (36.4 C)-101.3 F (38.5 C)] 101.3 F (38.5 C) (11/01 0422) Pulse Rate:  [48-158] 138 (11/01 0645) Resp:  [16-46] 41 (11/01 0645) BP: (61-125)/(43-102) 108/57 (11/01 0645) SpO2:  [89 %-99 %] 90 % (11/01 0645) Weight:  [54.7 kg] 54.7 kg (10/31 1013)     Height: 5\' 4"  (162.6 cm) Weight: 54.7 kg     Intake/Output last 2 shifts:  10/31 0701 - 11/01 0700 In: 2500.7 [IV Piggyback:2500.7] Out: 58 [Urine:60; Stool:1]   Physical Exam:  Constitutional: alert, cooperative and no distress  HENT: normocephalic without obvious abnormality  Eyes: PERRL, EOM's grossly intact and symmetric  Respiratory: NAD, tachypneic   Cardiovascular: Tachycardic, irregular Gastrointestinal: Soft, she appears most tender in the left lower  quadrant, no overt distension Musculoskeletal: no edema or wounds, motor and sensation grossly intact, NT    Labs:  CBC Latest Ref Rng & Units 01/10/2021 12/16/2020 12/22/2020  WBC 4.0 - 10.5 K/uL 1.6(L) - 1.1(LL)  Hemoglobin 12.0 - 15.0 g/dL 9.0(L) 9.3(L) 10.2(L)  Hematocrit 36.0 - 46.0 % 25.7(L) 26.2(L) 30.1(L)  Platelets 150 - 400 K/uL 195 - 179   CMP Latest Ref Rng & Units 01/10/2021 12/20/2020  Glucose 70 - 99 mg/dL 124(H) 59(L)  BUN 8 - 23 mg/dL 27(H) 28(H)  Creatinine 0.44 - 1.00 mg/dL 1.65(H) 1.70(H)  Sodium 135 - 145 mmol/L 133(L) 128(L)  Potassium 3.5 - 5.1 mmol/L 2.3(LL) 3.3(L)  Chloride 98 - 111 mmol/L 96(L) 101  CO2 22 - 32 mmol/L 24 14(L)  Calcium 8.9 - 10.3 mg/dL 6.6(L) 7.6(L)  Total Protein 6.5 - 8.1 g/dL - 4.8(L)  Total Bilirubin 0.3 - 1.2 mg/dL - 1.2  Alkaline Phos 38 - 126 U/L - 79  AST 15 - 41 U/L - 17  ALT 0 - 44 U/L - 9    Imaging studies: No new pertinent imaging studies   Assessment/Plan: (ICD-10's: A12.72) 70 y.o. female with continued fevers overnight, admitted with pancolitis secondary to Clostridium difficile, without overt evidence of toxic megacolon or impending perforation currently, complicated by atrial fibrillation with RVR   - Will repeat STAT lactic acid level this morning given this had climbed to 6.4 last night. If this continues to be significantly elevated, or worsened, we will likely need to proceed urgently this morning to the OR for total abdominal colectomy and end ileostomy. She is understanding of this.   -  Recommend continued NPO - Continue IV Flagyl and PO Vancomycin   - Monitor abdominal examination closely +/- serial AXR   - Continue IVF support - Monitor leukopenia; slight improvement - Monitor renal function; UO - Monitor lactic acid level; pending - Appreciate PCCM assistance; we will follow    All of the above findings and recommendations were discussed with the patient, and the medical team, and all of patient's questions  were answered to her expressed satisfaction.  -- Lynden Oxford, PA-C [facility] 01/10/2021, 7:18 AM [phone] M-F: 7am - 4pm

## 2021-01-10 NOTE — Consult Note (Signed)
[facility] Cardiology  CARDIOLOGY CONSULT NOTE  Patient ID: Lauren Bradshaw MRN: [id number] DOB/AGE: 1951/02/22 70 y.o.  Admit date: 12/29/2020 Referring Physician Minna Antis Primary Physician Regino Bellow, MD Primary Cardiologist NA Reason for Consultation AF with RVR  HPI:  Lauren Bradshaw is a 70 year old female with a history of atrial fibrillation, hypertension, hyperlipidemia, vasculitis who presents to the emergency department with generalized weakness and was subsequently discovered to be in atrial fibrillation with RVR in the setting of sepsis and multisystem organ failure.   Interval history: - Continues to be in AF with RVR. - Appears short of breath today, breathing 40 times a minute. - Says she feels better, but still lethargic.  - No chest pain.   Review of systems complete and found to be negative unless listed above     Past Medical History:  Diagnosis Date   Atrial fibrillation (HCC)    C. difficile diarrhea    GERD (gastroesophageal reflux disease)    Hyperlipidemia    Hypertension    Insomnia    Seizure (HCC)    Thyroid disease    Vasculitis (HCC)       Medications Prior to Admission  Medication Sig Dispense Refill Last Dose   amLODipine (NORVASC) 10 MG tablet Take 10 mg by mouth daily.      atenolol (TENORMIN) 25 MG tablet Take 25 mg by mouth daily.      atorvastatin (LIPITOR) 40 MG tablet Take 40 mg by mouth daily.      carvedilol (COREG) 6.25 MG tablet Take 6.25 mg by mouth 2 (two) times daily.      diclofenac (VOLTAREN) 75 MG EC tablet Take 75 mg by mouth 2 (two) times daily.      furosemide (LASIX) 20 MG tablet Take 20 mg by mouth 2 (two) times daily.      gabapentin (NEURONTIN) 600 MG tablet Take 600 mg by mouth 4 (four) times daily.      ibuprofen (ADVIL) 800 MG tablet Take 800 mg by mouth 3 (three) times daily.      levothyroxine (SYNTHROID) 75 MCG tablet Take 75 mcg by mouth daily.      metoprolol succinate (TOPROL-XL) 50 MG 24 hr tablet Take 50 mg  by mouth daily.      montelukast (SINGULAIR) 10 MG tablet Take 10 mg by mouth at bedtime.      omeprazole (PRILOSEC) 40 MG capsule Take 1 capsule by mouth in the morning.      ondansetron (ZOFRAN) 4 MG tablet Take by mouth.      pantoprazole (PROTONIX) 40 MG tablet Take 40 mg by mouth 2 (two) times daily.      potassium chloride SA (KLOR-CON) 20 MEQ tablet Take 20 mEq by mouth daily.      rosuvastatin (CRESTOR) 20 MG tablet Take 20 mg by mouth at bedtime.      triamterene-hydrochlorothiazide (DYAZIDE) 37.5-25 MG capsule Take 1 capsule by mouth every morning.      cefUROXime (CEFTIN) 500 MG tablet Take 500 mg by mouth 2 (two) times daily. (Patient not taking: No sig reported)   Completed Course    Social History   Socioeconomic History   Marital status: Married    Spouse name: Not on file   Number of children: Not on file   Years of education: Not on file   Highest education level: Not on file  Occupational History   Not on file  Tobacco Use   Smoking status: Never   Smokeless tobacco:  Never  Substance and Sexual Activity   Alcohol use: Not Currently   Drug use: Not on file   Sexual activity: Not on file  Other Topics Concern   Not on file  Social History Narrative   Not on file   Social Determinants of Health   Financial Resource Strain: Not on file  Food Insecurity: Not on file  Transportation Needs: Not on file  Physical Activity: Not on file  Stress: Not on file  Social Connections: Not on file  Intimate Partner Violence: Not on file    No family history on file.    Review of systems complete and found to be negative unless listed above      PHYSICAL EXAM  General: Lethargic appearing with elevated respiratory rate. HEENT:  Normocephalic and atramatic Neck:  No JVD.  Lungs: Clear in anterior lung fields.  Tachypneic. Heart: Irregularly irregular.  Tachycardic.Marland Kitchen Normal S1 and S2 without gallops or murmurs.  Abdomen: Bowel sounds are positive, abdomen soft  and non-tender  Msk:  Back normal, normal gait. Normal strength and tone for age. Extremities: No clubbing, cyanosis or edema.   Neuro: Unable to answer questions.  No obvious focal deficits. Psych: Unable to accurately assess.  Labs:   Lab Results  Component Value Date   WBC 1.6 (L) 01/10/2021   HGB 9.0 (L) 01/10/2021   HCT 25.7 (L) 01/10/2021   MCV 82.1 01/10/2021   PLT 195 01/10/2021    Recent Labs  Lab 12/21/2020 1112 01/10/21 0450  NA 128* 133*  K 3.3* 2.3*  CL 101 96*  CO2 14* 24  BUN 28* 27*  CREATININE 1.70* 1.65*  CALCIUM 7.6* 6.6*  PROT 4.8*  --   BILITOT 1.2  --   ALKPHOS 79  --   ALT 9  --   AST 17  --   GLUCOSE 59* 124*    No results found for: CKTOTAL, CKMB, CKMBINDEX, TROPONINI No results found for: CHOL No results found for: HDL No results found for: LDLCALC No results found for: TRIG No results found for: CHOLHDL No results found for: LDLDIRECT    Radiology: CT ABDOMEN PELVIS WO CONTRAST  Addendum Date: 12/21/2020   ADDENDUM REPORT: 12/28/2020 16:17 ADDENDUM: Additional clinical history has been provided. The patient recently underwent a random left renal biopsy a few weeks ago that a required embolization. As such, the left renal subcapsular fluid collection likely represents old hematoma and the previously described 4 mm left renal calculus is likely a vascular coil. Electronically Signed   By: Titus Dubin M.D.   On: 12/18/2020 16:17   Result Date: 12/26/2020 CLINICAL DATA:  Diarrhea and weakness. EXAM: CT ABDOMEN AND PELVIS WITHOUT CONTRAST TECHNIQUE: Multidetector CT imaging of the abdomen and pelvis was performed following the standard protocol without IV contrast. COMPARISON:  None. FINDINGS: Lower chest: Small bilateral pleural effusions. Trace pericardial effusion. Mild bibasilar atelectasis/scarring. Hepatobiliary: Small calcification in the right hepatic lobe. No other focal liver abnormality. Mildly distended gallbladder without wall  thickening or radiopaque gallstones. No biliary dilatation. Pancreas: Unremarkable. No pancreatic ductal dilatation or surrounding inflammatory changes. Spleen: Normal in size without focal abnormality. Adrenals/Urinary Tract: The adrenal glands and right kidney are unremarkable. 3.9 x 5.5 x 8.7 cm low-density left renal subcapsular fluid collection (series 2, image 36; series 6, image 83). 4 mm calculus in the left kidney. No hydronephrosis. The bladder is unremarkable. Stomach/Bowel: Moderate hiatal hernia. The stomach is otherwise within normal limits. Severe circumferential wall thickening of the  entire colon. No pneumatosis. The small bowel is unremarkable. Normal appendix. Vascular/Lymphatic: Aortic atherosclerosis. No enlarged abdominal or pelvic lymph nodes. Reproductive: Prior hysterectomy. 3.9 cm simple appearing cyst in the right ovary. Other: Trace ascites around the liver, in both paracolic gutters, and in the pelvis. Prominent presacral soft tissue stranding. No pneumoperitoneum. Musculoskeletal: No acute or significant osseous findings. IMPRESSION: 1. Severe circumferential wall thickening of the entire colon, consistent with pancolitis. 2. 8.7 cm low-density left renal subcapsular fluid collection, concerning for abscess. Correlate with urinalysis. 3. Trace ascites.  Small bilateral pleural effusions. 4. 3.9 cm simple appearing right ovarian cyst. Recommend outpatient follow-up with pelvic US. Reference: JACR 2020 Feb;17(2):248-254 5. Aortic Atherosclerosis (ICD10-I70.0). Electronically Signed: By: Titus Dubin M.D. On: 12/20/2020 16:01   DG Chest 1 View  Result Date: 01/10/2021 CLINICAL DATA:  70 year old female with history of shortness of breath. EXAM: CHEST  1 VIEW COMPARISON:  Chest x-ray 01/02/2021. FINDINGS: Lung volumes are low. Persistent ill-defined opacity in the medial aspect of the right upper lobe, unchanged. Left lung appears clear. Known small bilateral pleural effusions are  not readily apparent on this single AP view. No pneumothorax. No evidence of pulmonary edema. Heart size is borderline enlarged. Atherosclerotic calcifications in the thoracic aorta. IMPRESSION: 1. Persistent medial right upper lobe airspace consolidation concerning for pneumonia. Followup PA and lateral chest X-ray is recommended in 3-4 weeks following trial of antibiotic therapy to ensure resolution and exclude underlying malignancy. 2. Aortic atherosclerosis. 3. Small bilateral pleural effusions noted on yesterday's CT the abdomen and pelvis are not readily apparent on today's plain film examination. Electronically Signed   By: Vinnie Langton M.D.   On: 01/10/2021 05:31   US Venous Img Lower Bilateral (DVT)  Result Date: 01/10/2021 CLINICAL DATA:  Bilateral lower extremity pain and edema. Evaluate for DVT. EXAM: BILATERAL LOWER EXTREMITY VENOUS DOPPLER ULTRASOUND TECHNIQUE: Gray-scale sonography with graded compression, as well as color Doppler and duplex ultrasound were performed to evaluate the lower extremity deep venous systems from the level of the common femoral vein and including the common femoral, femoral, profunda femoral, popliteal and calf veins including the posterior tibial, peroneal and gastrocnemius veins when visible. The superficial great saphenous vein was also interrogated. Spectral Doppler was utilized to evaluate flow at rest and with distal augmentation maneuvers in the common femoral, femoral and popliteal veins. COMPARISON:  None. FINDINGS: RIGHT LOWER EXTREMITY Common Femoral Vein: No evidence of thrombus. Normal compressibility, respiratory phasicity and response to augmentation. Saphenofemoral Junction: No evidence of thrombus. Normal compressibility and flow on color Doppler imaging. Profunda Femoral Vein: No evidence of thrombus. Normal compressibility and flow on color Doppler imaging. Femoral Vein: No evidence of thrombus. Normal compressibility, respiratory phasicity and  response to augmentation. Popliteal Vein: No evidence of thrombus. Normal compressibility, respiratory phasicity and response to augmentation. Calf Veins: No evidence of thrombus. Normal compressibility and flow on color Doppler imaging. Superficial Great Saphenous Vein: No evidence of thrombus. Normal compressibility. Venous Reflux:  None. Other Findings:  None. LEFT LOWER EXTREMITY Common Femoral Vein: Not visualized secondary to bandage associated with left femoral approach central venous catheter. Saphenofemoral Junction: Not visualized secondary to bandages so shaded with left femoral approach central venous catheter. Profunda Femoral Vein: No evidence of thrombus. Normal compressibility and flow on color Doppler imaging. Femoral Vein: No evidence of thrombus. Normal compressibility, respiratory phasicity and response to augmentation. Popliteal Vein: No evidence of thrombus. Normal compressibility, respiratory phasicity and response to augmentation. Calf Veins: No evidence of thrombus.  Normal compressibility and flow on color Doppler imaging. Superficial Great Saphenous Vein: No evidence of thrombus. Normal compressibility. Venous Reflux:  None. Other Findings:  None. IMPRESSION: No evidence of DVT within either lower extremity. Electronically Signed   By: Sandi Mariscal M.D.   On: 01/10/2021 08:12   DG Chest Port 1 View  Result Date: 12/31/2020 CLINICAL DATA:  Possible sepsis Right-sided weakness and lethargic EXAM: PORTABLE CHEST 1 VIEW COMPARISON:  01/07/2019 FINDINGS: Heart size within normal limits. Mild pulmonary vascular congestion. There is new airspace opacity in the right suprahilar lung. IMPRESSION: New airspace opacity in the right suprahilar lung likely due to pneumonia. Radiographic follow-up to resolution is recommended to exclude developing mass. Electronically Signed   By: Miachel Roux M.D.   On: 01/03/2021 14:08   CT HEAD CODE STROKE WO CONTRAST  Result Date: 12/12/2020 CLINICAL DATA:   Code stroke. EXAM: CT HEAD WITHOUT CONTRAST TECHNIQUE: Contiguous axial images were obtained from the base of the skull through the vertex without intravenous contrast. COMPARISON:  None. FINDINGS: Brain: There is no acute intracranial hemorrhage, mass effect, or edema. Gray-white differentiation is preserved. Ventricles and sulci are normal in size and configuration. Patchy low-density in the supratentorial white matter is nonspecific but may reflect chronic microvascular ischemic changes. No extra-axial collection. Vascular: No hyperdense vessel. There is intracranial atherosclerotic calcification at the skull base. Skull: Unremarkable. Sinuses/Orbits: No acute or significant abnormality. Other: Mastoid air cells are clear. ASPECTS ([place] Stroke Program Early CT Score) - Ganglionic level infarction (caudate, lentiform nuclei, internal capsule, insula, M1-M3 cortex): 7 - Supraganglionic infarction (M4-M6 cortex): 3 Total score (0-10 with 10 being normal): 10 IMPRESSION: There is no acute intracranial hemorrhage or evidence of acute infarction. ASPECT score is 10. Chronic microvascular ischemic changes. These results were called by telephone at the time of interpretation on 12/24/2020 at 10:10 am to provider [facility] , who verbally acknowledged these results. Electronically Signed   By: Macy Mis M.D.   On: 12/16/2020 10:11    EKG: Atrial fibrillation with RVR.  ASSESSMENT AND PLAN:  Lauren Bradshaw is a 70 year old female with a history of atrial fibrillation, hypertension, hyperlipidemia, vasculitis who presents to the emergency department with generalized weakness and was subsequently discovered to be in atrial fibrillation with RVR. She also has multisystem organ failure possibly related to sepsis.  # AF with RVR # Multisystem organ failure, Severe sepsis The patient presents with fatigue and was subsequently discovered to have neutropenia, elevated creatinine, and altered mental status.  The  etiology of this is unclear but certainly could be concerning for severe sepsis or C. difficile.  In the setting she has atrial fibrillation with RVR to the 150s with relative hypotension.  The atrial fibrillation is certainly a reflection of her severe disease state. -Recommend ongoing management of medical issues per primary inpatient team. -Recommend initiation of IV amiodarone bolus plus drip for treatment of her atrial fibrillation. -Would hold on anticoagulation at this time given her recent bleeding issues and need for transfusion. - If she develops worsened hypotension thought to be related to AF, follow ACLS guidelines.  -Complete echocardiogram  Signed: Andrez Grime MD 01/10/2021, 8:23 AM

## 2021-01-10 NOTE — Progress Notes (Signed)
Pt hr 140's-150's amio gtt increased back up to 60 mg/hr per provider. Notified provider of elevated lactic acid as well. Levo started for hypotension. Pt RR in 40's, ABG drawn, awaiting results. Pt is alert and oriented following commands.

## 2021-01-10 NOTE — Consult Note (Signed)
[facility]  [phone] [phone]  ID: Lauren Bradshaw OB: 07-02-1950  MR#: [id number]  QIO#:[id number]  Patient Care Team: Kateri Mc, MD as PCP - General (Family Medicine)  CHIEF COMPLAINT: Neutropenia  INTERVAL HISTORY: Patient is a 70 year old female with a complicated medical history who was recently discharged from rehab with C. difficile, and ANCA renal vasculitis.  She was readmitted to the hospital with hemodynamic instability secondary to hypovolemic shock, metabolic acidosis, encephalopathy.  Patient also noted to have a significantly reduced total white blood cell count of 1.6 and an ANC of 0.4.  She received Rituxan and Cytoxan about 2 months ago for her vasculitis.  Much of the history is given by her son and daughter-in-law who are at bedside.  Patient still appears to be confused.  Over the weekend she developed diarrhea, abdominal pain, and progressive weakness and lethargy.  REVIEW OF SYSTEMS:   Review of Systems  Unable to perform ROS: Acuity of condition   PAST MEDICAL HISTORY: Past Medical History:  Diagnosis Date   Atrial fibrillation (HCC)    C. difficile diarrhea    GERD (gastroesophageal reflux disease)    Hyperlipidemia    Hypertension    Insomnia    Seizure ([place])    Thyroid disease    Vasculitis ([place])     PAST SURGICAL HISTORY: History reviewed. No pertinent surgical history.  FAMILY HISTORY: No family history on file.  ADVANCED DIRECTIVES (Y/N):  '@ADVDIR' @  HEALTH MAINTENANCE: Social History   Tobacco Use   Smoking status: Never   Smokeless tobacco: Never  Substance Use Topics   Alcohol use: Not Currently     Colonoscopy:  PAP:  Bone density:  Lipid panel:  Allergies  Allergen Reactions   Codeine Shortness Of Breath   Sulfa Antibiotics Shortness Of Breath    Current Facility-Administered Medications  Medication Dose Route Frequency Provider Last Rate Last Admin   0.9 %  sodium chloride  infusion  250 mL Intravenous Continuous Milus Banister, NP   Held at 12/17/2020 1620   amiodarone (NEXTERONE PREMIX) 360-4.14 MG/200ML-% (1.8 mg/mL) IV infusion  60 mg/hr Intravenous Continuous Lang Snow, NP 33.3 mL/hr at 01/10/21 1459 60 mg/hr at 01/10/21 1459   Chlorhexidine Gluconate Cloth 2 % PADS 6 each  6 each Topical Daily Kasa, Kurian, MD       dextrose 50 % solution 50 mL  1 ampule Intravenous Once Milus Banister, NP       fentaNYL (SUBLIMAZE) injection 25 mcg  25 mcg Intravenous Q2H PRN Lang Snow, NP   25 mcg at 01/10/21 1457   [START ON 01/11/2021] levothyroxine (SYNTHROID) tablet 75 mcg  75 mcg Oral Q0600 Lorna Dibble, [facility]       MEDLINE mouth rinse  15 mL Mouth Rinse BID Mortimer Fries, Kurian, MD       metroNIDAZOLE (FLAGYL) IVPB 500 mg  500 mg Intravenous Q8H Graves, Dana E, NP 100 mL/hr at 01/10/21 1108 500 mg at 01/10/21 1108   norepinephrine (LEVOPHED) 16 mg in 245m premix infusion  0-40 mcg/min Intravenous Titrated OLang Snow NP 1.88 mL/hr at 01/10/21 1141 2 mcg/min at 01/10/21 1141   ondansetron (ZOFRAN) injection 4 mg  4 mg Intravenous Q6H PRN GMilus Banister NP       potassium chloride 10 mEq in 50 mL *CENTRAL LINE* IVPB  10 mEq Intravenous Q1 Hr x 2 Graves, DRaeford Razor NP       potassium chloride SA (KLOR-CON)  CR tablet 40 mEq  40 mEq Oral Once Milus Banister, NP       vancomycin (VANCOCIN) capsule 500 mg  500 mg Oral Q6H Graves, Dana E, NP   500 mg at 01/10/21 1140    OBJECTIVE: Vitals:   01/10/21 1400 01/10/21 1500  BP: (!) 101/54   Pulse: (!) 146   Resp: (!) 32   Temp:  98 F (36.7 C)  SpO2: 96%      Body mass index is 20.7 kg/m.    ECOG FS:4 - Bedbound  General: Ill-appearing, no acute distress. Eyes: Pink conjunctiva, anicteric sclera. HEENT: Normocephalic, moist mucous membranes. Lungs: No audible wheezing or coughing. Heart: Regular rate and rhythm. Abdomen: Soft, nontender, no obvious distention. Musculoskeletal: No edema,  cyanosis, or clubbing. Neuro: Alert, Cranial nerves grossly intact. Skin: No rashes or petechiae noted.  LAB RESULTS:  Lab Results  Component Value Date   NA 132 (L) 01/10/2021   K 2.7 (LL) 01/10/2021   CL 93 (L) 01/10/2021   CO2 23 01/10/2021   GLUCOSE 111 (H) 01/10/2021   BUN 28 (H) 01/10/2021   CREATININE 1.61 (H) 01/10/2021   CALCIUM 6.7 (L) 01/10/2021   PROT 3.8 (L) 01/10/2021   ALBUMIN 1.6 (L) 01/10/2021   AST 18 01/10/2021   ALT 10 01/10/2021   ALKPHOS 130 (H) 01/10/2021   BILITOT 0.8 01/10/2021   GFRNONAA 35 (L) 01/10/2021    Lab Results  Component Value Date   WBC 1.6 (L) 01/10/2021   NEUTROABS 0.4 (LL) 12/30/2020   HGB 9.1 (L) 01/10/2021   HCT 25.7 (L) 01/10/2021   MCV 82.1 01/10/2021   PLT 195 01/10/2021     STUDIES: CT ABDOMEN PELVIS WO CONTRAST  Addendum Date: 01/01/2021   ADDENDUM REPORT: 12/21/2020 16:17 ADDENDUM: Additional clinical history has been provided. The patient recently underwent a random left renal biopsy a few weeks ago that a required embolization. As such, the left renal subcapsular fluid collection likely represents old hematoma and the previously described 4 mm left renal calculus is likely a vascular coil. Electronically Signed   By: Titus Dubin M.D.   On: 12/16/2020 16:17   Result Date: 12/29/2020 CLINICAL DATA:  Diarrhea and weakness. EXAM: CT ABDOMEN AND PELVIS WITHOUT CONTRAST TECHNIQUE: Multidetector CT imaging of the abdomen and pelvis was performed following the standard protocol without IV contrast. COMPARISON:  None. FINDINGS: Lower chest: Small bilateral pleural effusions. Trace pericardial effusion. Mild bibasilar atelectasis/scarring. Hepatobiliary: Small calcification in the right hepatic lobe. No other focal liver abnormality. Mildly distended gallbladder without wall thickening or radiopaque gallstones. No biliary dilatation. Pancreas: Unremarkable. No pancreatic ductal dilatation or surrounding inflammatory changes.  Spleen: Normal in size without focal abnormality. Adrenals/Urinary Tract: The adrenal glands and right kidney are unremarkable. 3.9 x 5.5 x 8.7 cm low-density left renal subcapsular fluid collection (series 2, image 36; series 6, image 83). 4 mm calculus in the left kidney. No hydronephrosis. The bladder is unremarkable. Stomach/Bowel: Moderate hiatal hernia. The stomach is otherwise within normal limits. Severe circumferential wall thickening of the entire colon. No pneumatosis. The small bowel is unremarkable. Normal appendix. Vascular/Lymphatic: Aortic atherosclerosis. No enlarged abdominal or pelvic lymph nodes. Reproductive: Prior hysterectomy. 3.9 cm simple appearing cyst in the right ovary. Other: Trace ascites around the liver, in both paracolic gutters, and in the pelvis. Prominent presacral soft tissue stranding. No pneumoperitoneum. Musculoskeletal: No acute or significant osseous findings. IMPRESSION: 1. Severe circumferential wall thickening of the entire colon, consistent with pancolitis. 2. 8.7  cm low-density left renal subcapsular fluid collection, concerning for abscess. Correlate with urinalysis. 3. Trace ascites.  Small bilateral pleural effusions. 4. 3.9 cm simple appearing right ovarian cyst. Recommend outpatient follow-up with pelvic US. Reference: JACR 2020 Feb;17(2):248-254 5. Aortic Atherosclerosis (ICD10-I70.0). Electronically Signed: By: Titus Dubin M.D. On: 12/16/2020 16:01   DG Chest 1 View  Result Date: 01/10/2021 CLINICAL DATA:  70 year old female with history of shortness of breath. EXAM: CHEST  1 VIEW COMPARISON:  Chest x-ray 01/08/2021. FINDINGS: Lung volumes are low. Persistent ill-defined opacity in the medial aspect of the right upper lobe, unchanged. Left lung appears clear. Known small bilateral pleural effusions are not readily apparent on this single AP view. No pneumothorax. No evidence of pulmonary edema. Heart size is borderline enlarged. Atherosclerotic  calcifications in the thoracic aorta. IMPRESSION: 1. Persistent medial right upper lobe airspace consolidation concerning for pneumonia. Followup PA and lateral chest X-ray is recommended in 3-4 weeks following trial of antibiotic therapy to ensure resolution and exclude underlying malignancy. 2. Aortic atherosclerosis. 3. Small bilateral pleural effusions noted on yesterday's CT the abdomen and pelvis are not readily apparent on today's plain film examination. Electronically Signed   By: Vinnie Langton M.D.   On: 01/10/2021 05:31   US Venous Img Lower Bilateral (DVT)  Result Date: 01/10/2021 CLINICAL DATA:  Bilateral lower extremity pain and edema. Evaluate for DVT. EXAM: BILATERAL LOWER EXTREMITY VENOUS DOPPLER ULTRASOUND TECHNIQUE: Gray-scale sonography with graded compression, as well as color Doppler and duplex ultrasound were performed to evaluate the lower extremity deep venous systems from the level of the common femoral vein and including the common femoral, femoral, profunda femoral, popliteal and calf veins including the posterior tibial, peroneal and gastrocnemius veins when visible. The superficial great saphenous vein was also interrogated. Spectral Doppler was utilized to evaluate flow at rest and with distal augmentation maneuvers in the common femoral, femoral and popliteal veins. COMPARISON:  None. FINDINGS: RIGHT LOWER EXTREMITY Common Femoral Vein: No evidence of thrombus. Normal compressibility, respiratory phasicity and response to augmentation. Saphenofemoral Junction: No evidence of thrombus. Normal compressibility and flow on color Doppler imaging. Profunda Femoral Vein: No evidence of thrombus. Normal compressibility and flow on color Doppler imaging. Femoral Vein: No evidence of thrombus. Normal compressibility, respiratory phasicity and response to augmentation. Popliteal Vein: No evidence of thrombus. Normal compressibility, respiratory phasicity and response to augmentation. Calf  Veins: No evidence of thrombus. Normal compressibility and flow on color Doppler imaging. Superficial Great Saphenous Vein: No evidence of thrombus. Normal compressibility. Venous Reflux:  None. Other Findings:  None. LEFT LOWER EXTREMITY Common Femoral Vein: Not visualized secondary to bandage associated with left femoral approach central venous catheter. Saphenofemoral Junction: Not visualized secondary to bandages so shaded with left femoral approach central venous catheter. Profunda Femoral Vein: No evidence of thrombus. Normal compressibility and flow on color Doppler imaging. Femoral Vein: No evidence of thrombus. Normal compressibility, respiratory phasicity and response to augmentation. Popliteal Vein: No evidence of thrombus. Normal compressibility, respiratory phasicity and response to augmentation. Calf Veins: No evidence of thrombus. Normal compressibility and flow on color Doppler imaging. Superficial Great Saphenous Vein: No evidence of thrombus. Normal compressibility. Venous Reflux:  None. Other Findings:  None. IMPRESSION: No evidence of DVT within either lower extremity. Electronically Signed   By: Sandi Mariscal M.D.   On: 01/10/2021 08:12   DG Chest Port 1 View  Result Date: 01/05/2021 CLINICAL DATA:  Possible sepsis Right-sided weakness and lethargic EXAM: PORTABLE CHEST 1 VIEW COMPARISON:  01/07/2019 FINDINGS: Heart size within normal limits. Mild pulmonary vascular congestion. There is new airspace opacity in the right suprahilar lung. IMPRESSION: New airspace opacity in the right suprahilar lung likely due to pneumonia. Radiographic follow-up to resolution is recommended to exclude developing mass. Electronically Signed   By: Miachel Roux M.D.   On: 12/30/2020 14:08   ECHOCARDIOGRAM COMPLETE  Result Date: 01/10/2021    ECHOCARDIOGRAM REPORT   Patient Name:   Lauren Bradshaw Date of Exam: 01/10/2021 Medical Rec #:  [id number]  Height:       64.0 in Accession #:    [id number] Weight:       120.6  lb Date of Birth:  02/23/1951   BSA:          1.578 m Patient Age:    9 years   BP:           94/58 mmHg Patient Gender: F          HR:           132 bpm. Exam Location:  [facility] Procedure: 2D Echo, Cardiac Doppler and Color Doppler Indications:     Atrial Fibrillation I48.91  History:         Patient has no prior history of Echocardiogram examinations.                  Arrythmias:Atrial Fibrillation; Risk Factors:Hypertension and                  Dyslipidemia.  Sonographer:     Sherrie Sport Referring Phys:  [id number] Kathrin Ruddy ORGEL Diagnosing Phys: Donnelly Angelica  Sonographer Comments: Suboptimal apical window. IMPRESSIONS  1. Left ventricular ejection fraction, by estimation, is 60 to 65%. The left ventricle has normal function. The left ventricle has no regional wall motion abnormalities. Left ventricular diastolic parameters are indeterminate.  2. Right ventricular systolic function was not well visualized. The right ventricular size is not well visualized.  3. Left atrial size was mildly dilated.  4. The mitral valve is normal in structure. No evidence of mitral valve regurgitation. No evidence of mitral stenosis.  5. The aortic valve was not well visualized. Aortic valve regurgitation is not visualized.  6. The inferior vena cava is normal in size with greater than 50% respiratory variability, suggesting right atrial pressure of 3 mmHg. FINDINGS  Left Ventricle: Left ventricular ejection fraction, by estimation, is 60 to 65%. The left ventricle has normal function. The left ventricle has no regional wall motion abnormalities. The left ventricular internal cavity size was small. There is no left ventricular hypertrophy. Left ventricular diastolic parameters are indeterminate. Right Ventricle: The right ventricular size is not well visualized. Right vetricular wall thickness was not well visualized. Right ventricular systolic function was not well visualized. Left Atrium: Left atrial size was mildly dilated. Right  Atrium: Right atrial size was not well visualized. Pericardium: There is no evidence of pericardial effusion. Mitral Valve: The mitral valve is normal in structure. No evidence of mitral valve regurgitation. No evidence of mitral valve stenosis. MV peak gradient, 7.0 mmHg. The mean mitral valve gradient is 3.0 mmHg. Tricuspid Valve: The tricuspid valve is not well visualized. Tricuspid valve regurgitation is not demonstrated. Aortic Valve: The aortic valve was not well visualized. Aortic valve regurgitation is not visualized. Aortic valve mean gradient measures 3.0 mmHg. Aortic valve peak gradient measures 6.9 mmHg. Aortic valve area, by VTI measures 2.54 cm. Pulmonic Valve: The pulmonic valve was not well visualized. Pulmonic valve regurgitation  is not visualized. No evidence of pulmonic stenosis. Aorta: The aortic root was not well visualized. Venous: The inferior vena cava is normal in size with greater than 50% respiratory variability, suggesting right atrial pressure of 3 mmHg. IAS/Shunts: The interatrial septum was not well visualized.  LEFT VENTRICLE PLAX 2D LVIDd:         3.97 cm   Diastology LVIDs:         2.89 cm   LV e' medial:    9.68 cm/s LV PW:         1.23 cm   LV E/e' medial:  9.9 LV IVS:        0.85 cm   LV e' lateral:   10.80 cm/s LVOT diam:     2.00 cm   LV E/e' lateral: 8.8 LV SV:         38 LV SV Index:   24 LVOT Area:     3.14 cm  RIGHT VENTRICLE RV Basal diam:  3.53 cm RV S prime:     14.90 cm/s TAPSE (M-mode): 3.8 cm LEFT ATRIUM             Index        RIGHT ATRIUM           Index LA diam:        4.50 cm 2.85 cm/m   RA Area:     17.00 cm LA Vol (A2C):   44.1 ml 27.95 ml/m  RA Volume:   49.00 ml  31.05 ml/m LA Vol (A4C):   72.0 ml 45.63 ml/m LA Biplane Vol: 59.0 ml 37.39 ml/m  AORTIC VALVE                    PULMONIC VALVE AV Area (Vmax):    2.45 cm     PV Vmax:        1.04 m/s AV Area (Vmean):   2.76 cm     PV Vmean:       71.600 cm/s AV Area (VTI):     2.54 cm     PV VTI:          0.137 m AV Vmax:           131.00 cm/s  PV Peak grad:   4.3 mmHg AV Vmean:          79.800 cm/s  PV Mean grad:   2.0 mmHg AV VTI:            0.151 m      RVOT Peak grad: 4 mmHg AV Peak Grad:      6.9 mmHg AV Mean Grad:      3.0 mmHg LVOT Vmax:         102.00 cm/s LVOT Vmean:        70.000 cm/s LVOT VTI:          0.122 m LVOT/AV VTI ratio: 0.81  AORTA Ao Root diam: 2.80 cm MITRAL VALVE               TRICUSPID VALVE MV Area (PHT): 3.21 cm    TR Peak grad:   9.2 mmHg MV Area VTI:   2.25 cm    TR Vmax:        152.00 cm/s MV Peak grad:  7.0 mmHg MV Mean grad:  3.0 mmHg    SHUNTS MV Vmax:       1.32 m/s    Systemic VTI:  0.12 m MV Vmean:  81.1 cm/s   Systemic Diam: 2.00 cm MV Decel Time: 236 msec    Pulmonic VTI:  0.111 m MV E velocity: 95.50 cm/s MV A velocity: 83.10 cm/s MV E/A ratio:  1.15 Donnelly Angelica Electronically signed by Donnelly Angelica Signature Date/Time: 01/10/2021/1:17:36 PM    Final    CT HEAD CODE STROKE WO CONTRAST  Result Date: 12/28/2020 CLINICAL DATA:  Code stroke. EXAM: CT HEAD WITHOUT CONTRAST TECHNIQUE: Contiguous axial images were obtained from the base of the skull through the vertex without intravenous contrast. COMPARISON:  None. FINDINGS: Brain: There is no acute intracranial hemorrhage, mass effect, or edema. Gray-white differentiation is preserved. Ventricles and sulci are normal in size and configuration. Patchy low-density in the supratentorial white matter is nonspecific but may reflect chronic microvascular ischemic changes. No extra-axial collection. Vascular: No hyperdense vessel. There is intracranial atherosclerotic calcification at the skull base. Skull: Unremarkable. Sinuses/Orbits: No acute or significant abnormality. Other: Mastoid air cells are clear. ASPECTS ([place] Stroke Program Early CT Score) - Ganglionic level infarction (caudate, lentiform nuclei, internal capsule, insula, M1-M3 cortex): 7 - Supraganglionic infarction (M4-M6 cortex): 3 Total score (0-10 with 10 being  normal): 10 IMPRESSION: There is no acute intracranial hemorrhage or evidence of acute infarction. ASPECT score is 10. Chronic microvascular ischemic changes. These results were called by telephone at the time of interpretation on 01/04/2021 at 10:10 am to provider [facility] , who verbally acknowledged these results. Electronically Signed   By: Macy Mis M.D.   On: 12/11/2020 10:11    ASSESSMENT: Neutropenia  PLAN:    Neutropenia: Unclear patient's baseline as we do not have labs from [facility].  This is unrelated to her Rituxan and Cytoxan infusion which occurred over 2 months ago.  Likely multifactorial given multiple medications as well as acute illness.  Patient does not require a bone marrow biopsy at this time.  Would also hold Granix as white blood cell count is improving unless patient becomes persistently febrile.  Continue to monitor daily CBC including differential.  If no significant improvement upon discharge, can consider bone marrow biopsy as an outpatient. Anemia: Patient likely has poor bone marrow reserve given her acute illness.  Family reports that she received several units of packed red blood cells well admission to [facility] several months ago.  Currently hemoglobin is stable at 9.1, monitor. Septic shock/metabolic acidosis: Appreciate critical care input.  Continue aggressive fluid resuscitation, vasopressors, and antibiotics as ordered. Abdominal pain: CT abdomen pelvis revealed pancolitis and possible left renal subcapsular fluid collection concerning for abscess. Electrolyte derangement: Continue replacement as per critical care.  Appreciate consult, will follow.   Lloyd Huger, MD   01/10/2021 4:53 PM

## 2021-01-10 DEATH — deceased

## 2021-01-11 ENCOUNTER — Inpatient Hospital Stay: Payer: Medicare HMO

## 2021-01-11 DIAGNOSIS — I4891 Unspecified atrial fibrillation: Secondary | ICD-10-CM | POA: Diagnosis not present

## 2021-01-11 DIAGNOSIS — A419 Sepsis, unspecified organism: Secondary | ICD-10-CM | POA: Diagnosis not present

## 2021-01-11 DIAGNOSIS — K51018 Ulcerative (chronic) pancolitis with other complication: Secondary | ICD-10-CM | POA: Diagnosis not present

## 2021-01-11 DIAGNOSIS — A0472 Enterocolitis due to Clostridium difficile, not specified as recurrent: Secondary | ICD-10-CM | POA: Diagnosis not present

## 2021-01-11 DIAGNOSIS — R197 Diarrhea, unspecified: Secondary | ICD-10-CM | POA: Diagnosis not present

## 2021-01-11 LAB — CBC WITH DIFFERENTIAL/PLATELET
Abs Immature Granulocytes: 0.04 10*3/uL (ref 0.00–0.07)
Basophils Absolute: 0.1 10*3/uL (ref 0.0–0.1)
Basophils Relative: 2 %
Eosinophils Absolute: 0.2 10*3/uL (ref 0.0–0.5)
Eosinophils Relative: 9 %
HCT: 26.2 % — ABNORMAL LOW (ref 36.0–46.0)
Hemoglobin: 9.1 g/dL — ABNORMAL LOW (ref 12.0–15.0)
Immature Granulocytes: 2 %
Lymphocytes Relative: 15 %
Lymphs Abs: 0.4 10*3/uL — ABNORMAL LOW (ref 0.7–4.0)
MCH: 28.7 pg (ref 26.0–34.0)
MCHC: 34.7 g/dL (ref 30.0–36.0)
MCV: 82.6 fL (ref 80.0–100.0)
Monocytes Absolute: 0.3 10*3/uL (ref 0.1–1.0)
Monocytes Relative: 12 %
Neutro Abs: 1.5 10*3/uL — ABNORMAL LOW (ref 1.7–7.7)
Neutrophils Relative %: 60 %
Platelets: 156 10*3/uL (ref 150–400)
RBC: 3.17 MIL/uL — ABNORMAL LOW (ref 3.87–5.11)
RDW: 19.9 % — ABNORMAL HIGH (ref 11.5–15.5)
Smear Review: NORMAL
WBC: 2.5 10*3/uL — ABNORMAL LOW (ref 4.0–10.5)
nRBC: 0 % (ref 0.0–0.2)

## 2021-01-11 LAB — LACTIC ACID, PLASMA
Lactic Acid, Venous: 2.1 mmol/L (ref 0.5–1.9)
Lactic Acid, Venous: 2.1 mmol/L (ref 0.5–1.9)
Lactic Acid, Venous: 1.7 mmol/L (ref 0.5–1.9)

## 2021-01-11 LAB — BASIC METABOLIC PANEL
Anion gap: 9 (ref 5–15)
BUN: 28 mg/dL — ABNORMAL HIGH (ref 8–23)
CO2: 26 mmol/L (ref 22–32)
Calcium: 6.7 mg/dL — ABNORMAL LOW (ref 8.9–10.3)
Chloride: 94 mmol/L — ABNORMAL LOW (ref 98–111)
Creatinine, Ser: 1.61 mg/dL — ABNORMAL HIGH (ref 0.44–1.00)
GFR, Estimated: 35 mL/min — ABNORMAL LOW (ref >60)
Glucose, Bld: 78 mg/dL (ref 70–99)
Potassium: 3.8 mmol/L (ref 3.5–5.1)
Sodium: 129 mmol/L — ABNORMAL LOW (ref 135–145)

## 2021-01-11 LAB — GLUCOSE, CAPILLARY
Glucose-Capillary: 100 mg/dL — ABNORMAL HIGH (ref 70–99)
Glucose-Capillary: 111 mg/dL — ABNORMAL HIGH (ref 70–99)
Glucose-Capillary: 114 mg/dL — ABNORMAL HIGH (ref 70–99)
Glucose-Capillary: 139 mg/dL — ABNORMAL HIGH (ref 70–99)
Glucose-Capillary: 74 mg/dL (ref 70–99)
Glucose-Capillary: 81 mg/dL (ref 70–99)
Glucose-Capillary: 83 mg/dL (ref 70–99)

## 2021-01-11 LAB — URINE CULTURE: Culture: NO GROWTH

## 2021-01-11 LAB — THYROID PANEL
Free Thyroxine Index: 1.4 (ref 1.2–4.9)
T3 Uptake Ratio: 41 % — ABNORMAL HIGH (ref 24–39)
T4, Total: 3.3 ug/dL — ABNORMAL LOW (ref 4.5–12.0)

## 2021-01-11 LAB — STREP PNEUMONIAE URINARY ANTIGEN: Strep Pneumo Urinary Antigen: NEGATIVE

## 2021-01-11 LAB — PHOSPHORUS: Phosphorus: 2.7 mg/dL (ref 2.5–4.6)

## 2021-01-11 LAB — PROCALCITONIN: Procalcitonin: 45.1 ng/mL

## 2021-01-11 LAB — MAGNESIUM: Magnesium: 2.4 mg/dL (ref 1.7–2.4)

## 2021-01-11 IMAGING — MR MR THORACIC SPINE W/O CM
6 series · 32 of 48 positions shown · non-contrast
Comparison: None.

CLINICAL DATA: Right-sided weakness and lethargy. Possible
myelopathy.

EXAM:
MRI THORACIC SPINE WITHOUT CONTRAST
TECHNIQUE: Multiplanar, multisequence MR imaging of the thoracic spine was
performed. No intravenous contrast was administered.

[Series 24: T1 · sagittal · 6.0mm · 1.88mm/px · 2 of 9 slices shown (1 of 2)]
[im 1/9]
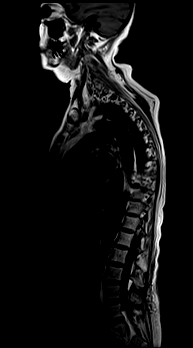
[im 9/9]
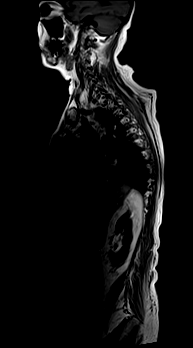

[Series 25: T2 · sagittal · 3.0mm · 1.06mm/px · 6 of 17 slices shown (1 of 2)]
[im 1/17]
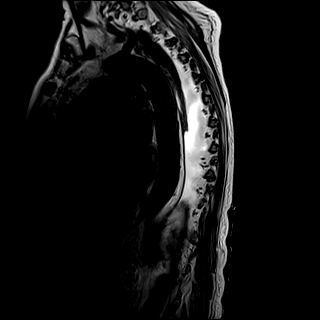
[im 4/17]
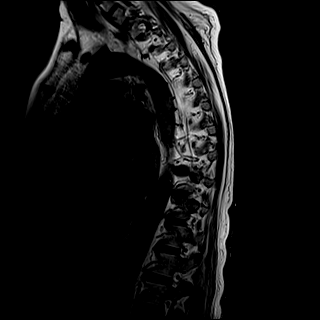
[im 7/17]
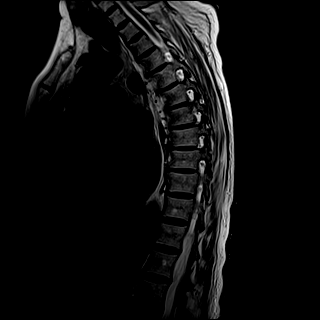
[im 10/17]
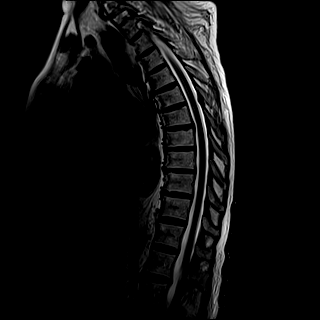
[im 13/17]
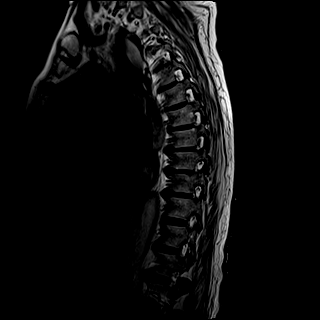
[im 17/17]
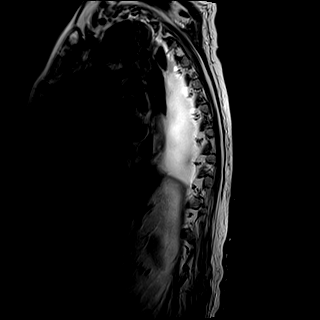

[Series 26: T1 · sagittal · 3.0mm · 1.06mm/px · 6 of 17 slices shown (2 of 2)]
[im 1/17]
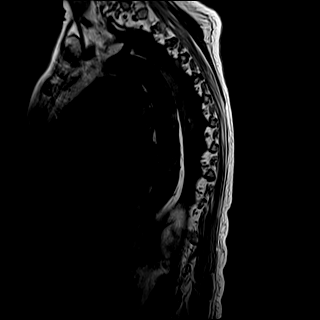
[im 4/17]
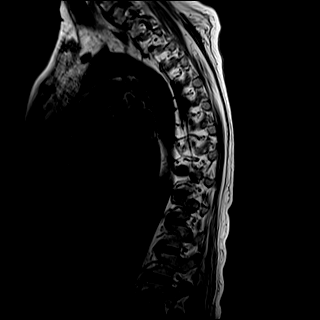
[im 7/17]
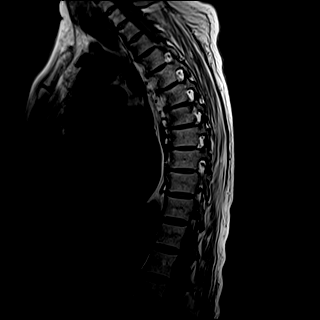
[im 10/17]
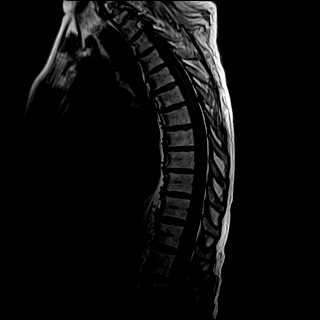
[im 13/17]
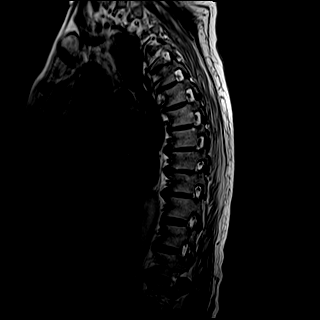
[im 17/17]
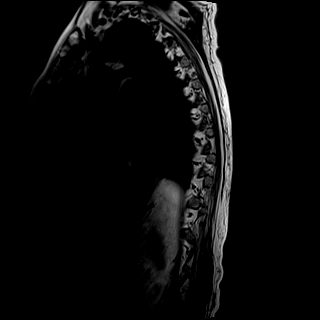

[Series 27: STIR · sagittal · 3.0mm · 0.66mm/px · 6 of 17 slices shown]
[im 1/17]
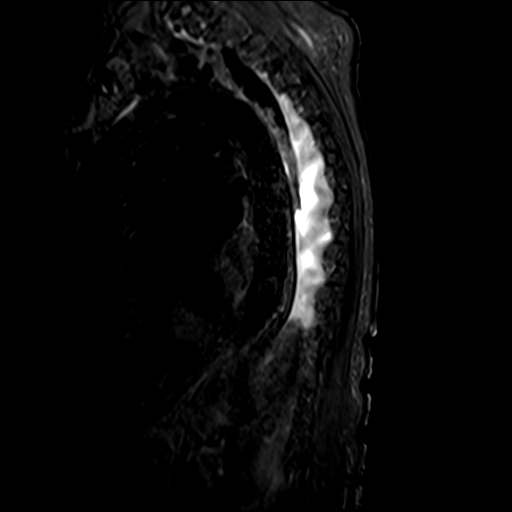
[im 4/17]
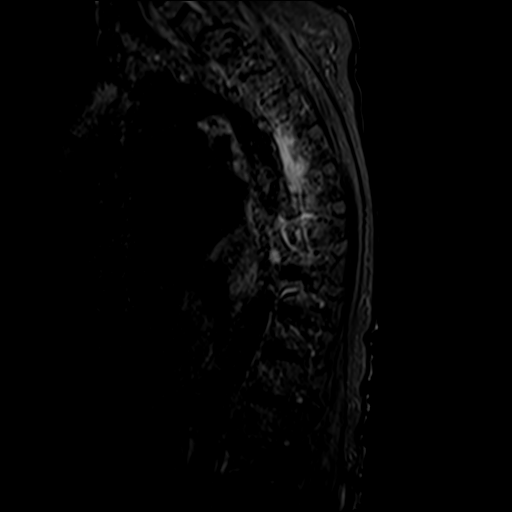
[im 7/17]
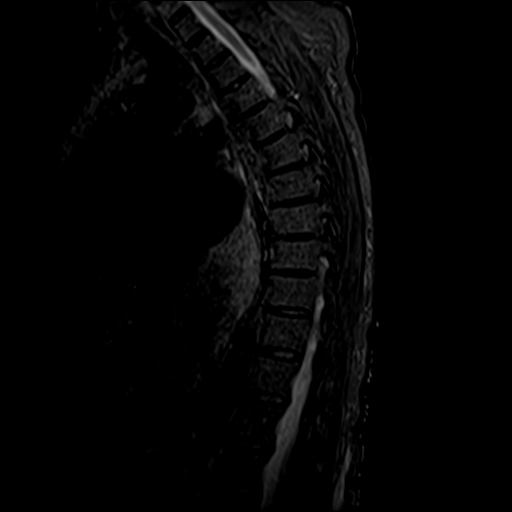
[im 10/17]
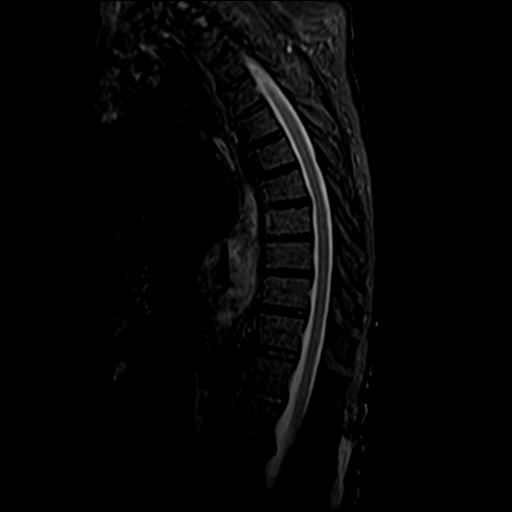
[im 13/17]
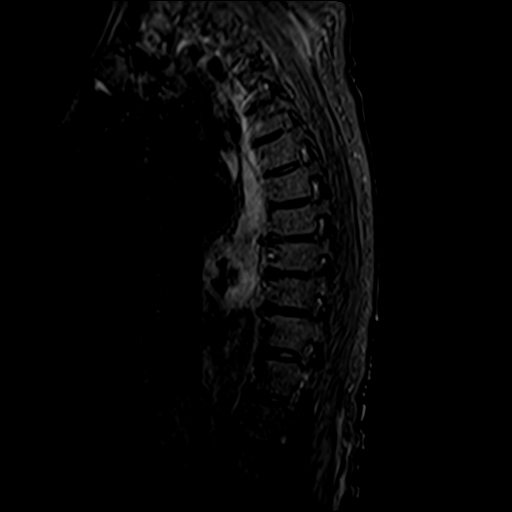
[im 17/17]
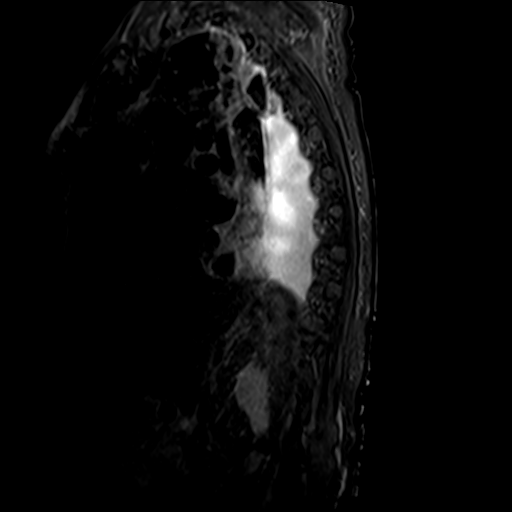

[Series 28: T2 · axial · 4.0mm · 0.59mm/px · z∈[-440,-241]mm · 8 of 39 slices shown (2 of 2)]
[im 1/39]
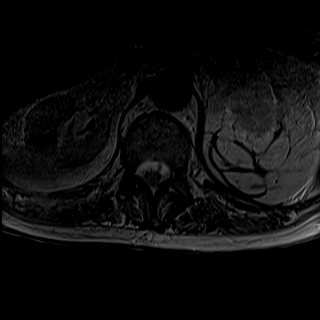
[im 6/39]
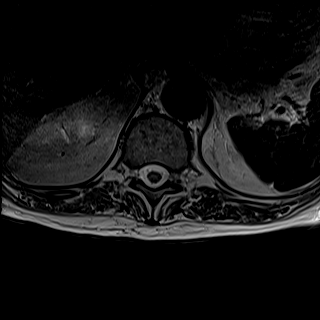
[im 12/39]
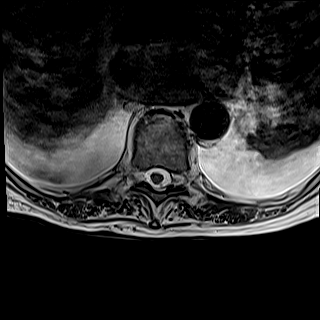
[im 18/39]
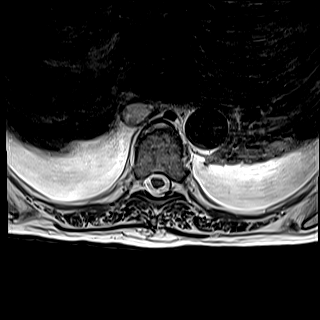
[im 21/39]
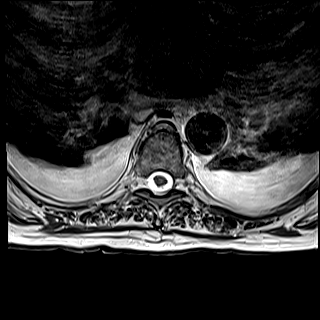
[im 27/39]
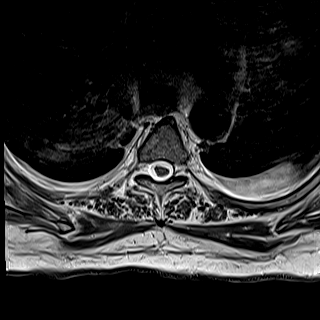
[im 33/39]
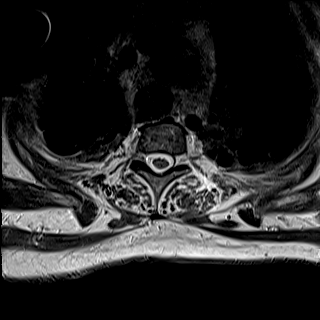
[im 39/39]
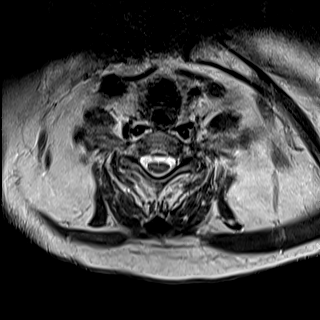

[Series 29: GRE · axial · 4.0mm · 0.37mm/px · z∈[-440,-335]mm · 4 of 39 slices shown]
[im 1/39]
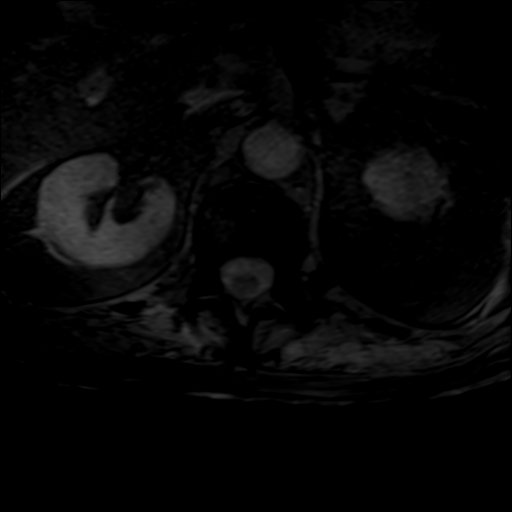
[im 6/39]
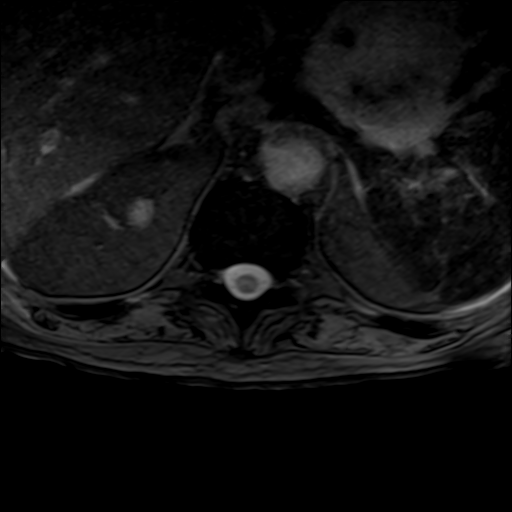
[im 12/39]
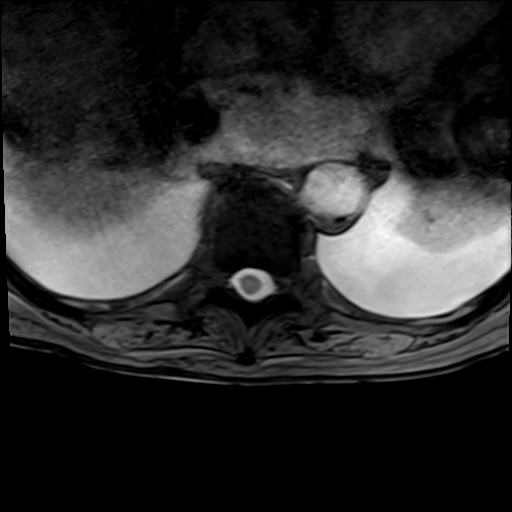
[im 18/39]
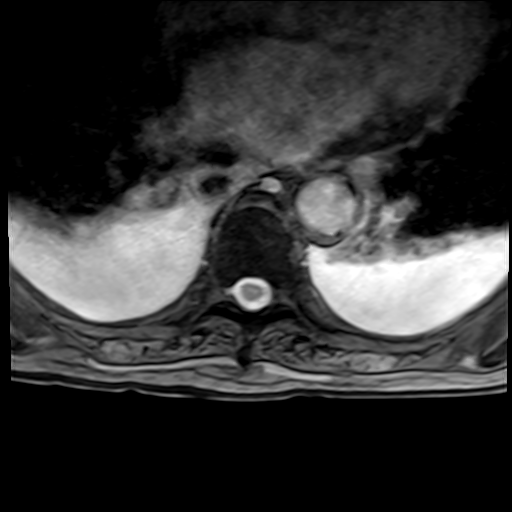

[32 of 48 positions shown; findings below may reference images not displayed]

FINDINGS: Alignment:  Normal

Vertebrae: Normal marrow signal.  No bone lesions or fractures.

Cord:  Normal cord signal intensity.  No cord lesions or syrinx.

Paraspinal and other soft tissues: No significant paraspinal
findings. There are moderate-sized bilateral pleural effusions
noted.

Disc levels:

No significant disc protrusions, spinal or foraminal stenosis.
IMPRESSION: 1. Unremarkable thoracic spine MRI examination.
2. Moderate-sized bilateral pleural effusions.

## 2021-01-11 IMAGING — MR MR HEAD W/O CM
12 series · 48 of 48 positions shown · non-contrast
Comparison: [DATE].

CLINICAL DATA: Neuro deficit, acute, stroke suspected

EXAM:
MRI HEAD WITHOUT CONTRAST
TECHNIQUE: Multiplanar, multiecho pulse sequences of the brain and surrounding
structures were obtained without intravenous contrast.

[Series 5: ax dwi_tracew · axial · 3.0mm · 0.65mm/px · z∈[-75,+78]mm · 3 of 48 slices shown]
[im 1/48]
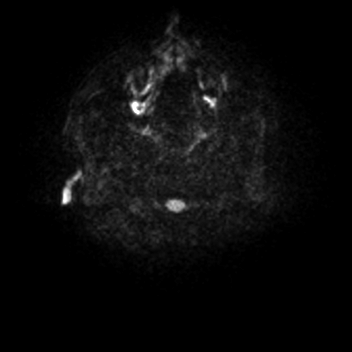
[im 24/48]
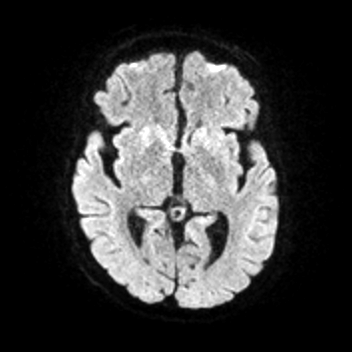
[im 48/48]
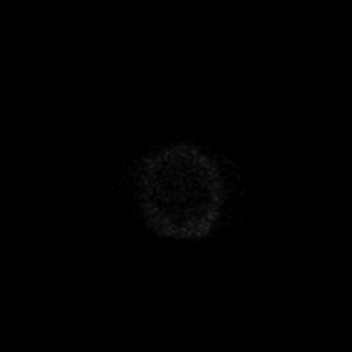

[Series 6: ax dwi_adc · axial · 3.0mm · 0.65mm/px · z∈[-75,+75]mm · 4 of 47 slices shown]
[im 1/47]
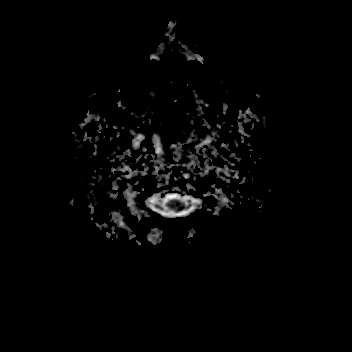
[im 16/47]
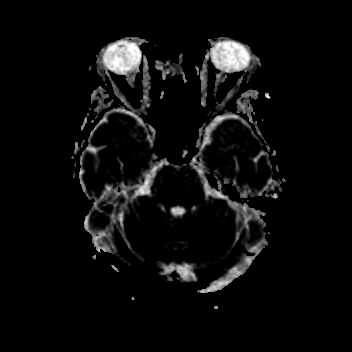
[im 31/47]
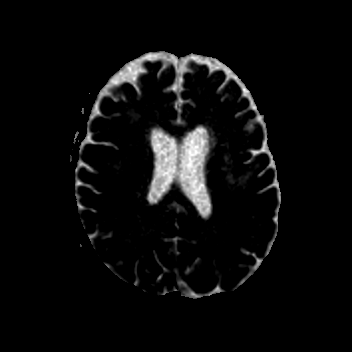
[im 47/47]
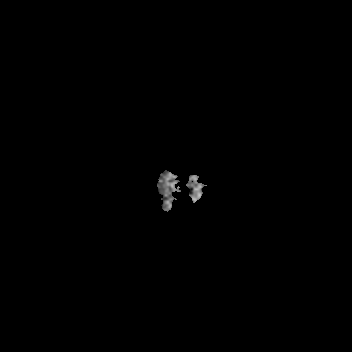

[Series 7: FLAIR · axial · 3.0mm · 0.53mm/px · z∈[-79,+81]mm · 5 of 55 slices shown]
[im 1/55]
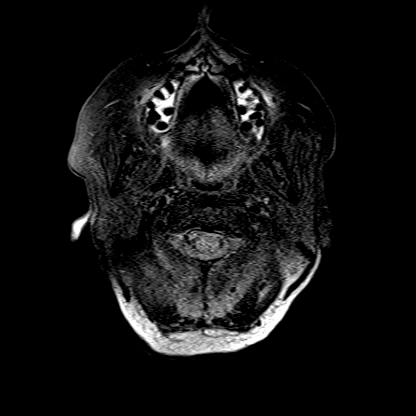
[im 14/55]
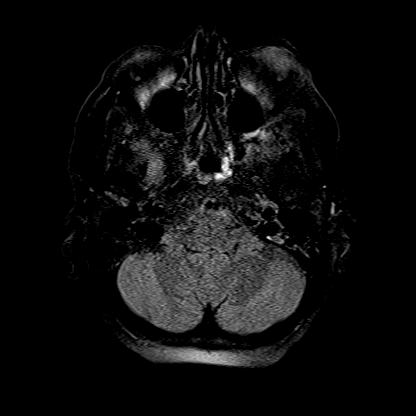
[im 28/55]
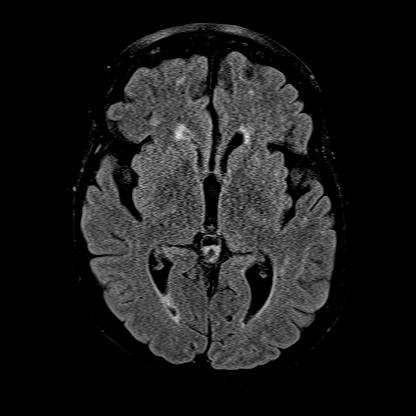
[im 41/55]
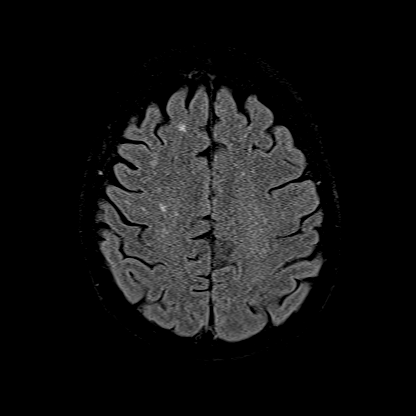
[im 55/55]
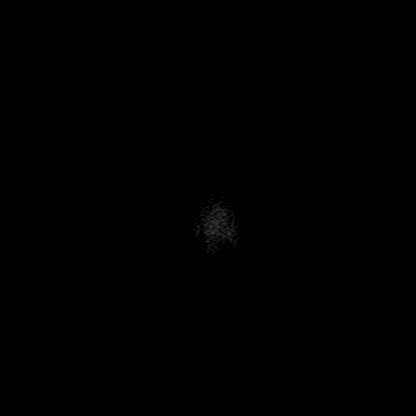

[Series 8: cor dwi_tracew · coronal · 5.0mm · 1.31mm/px · 4 of 38 slices shown]
[im 1/38]
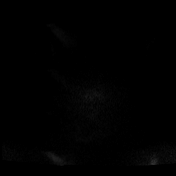
[im 13/38]
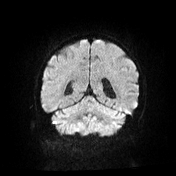
[im 25/38]
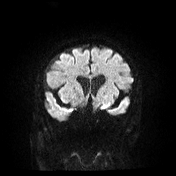
[im 38/38]
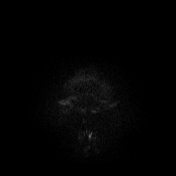

[Series 9: cor dwi_adc · coronal · 5.0mm · 1.31mm/px · 4 of 38 slices shown]
[im 1/38]
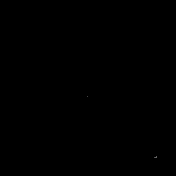
[im 13/38]
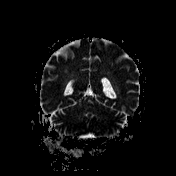
[im 25/38]
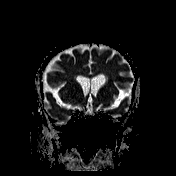
[im 38/38]
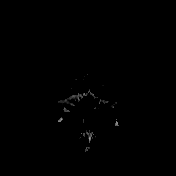

[Series 10: mag_images · axial · 3.0mm · 0.90mm/px · z∈[-86,+88]mm · 6 of 60 slices shown]
[im 1/60]
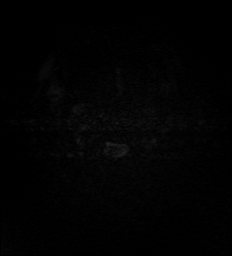
[im 12/60]
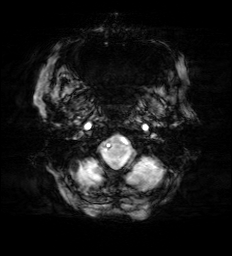
[im 24/60]
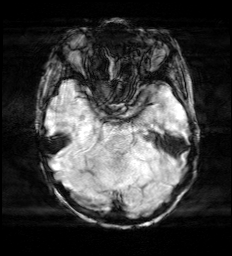
[im 36/60]
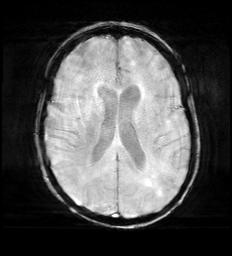
[im 48/60]
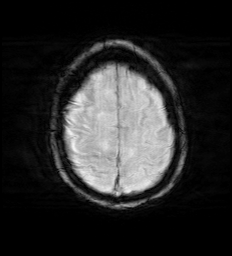
[im 60/60]
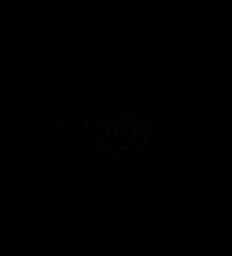

[Series 11: pha_images · axial · 3.0mm · 0.90mm/px · z∈[-83,+88]mm · 5 of 58 slices shown]
[im 1/58]
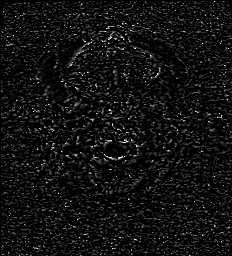
[im 15/58]
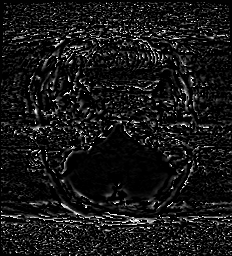
[im 29/58]
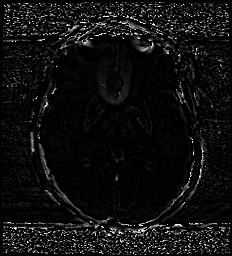
[im 43/58]
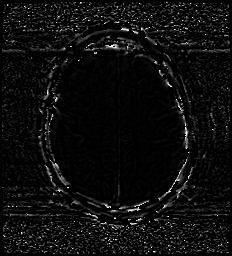
[im 58/58]
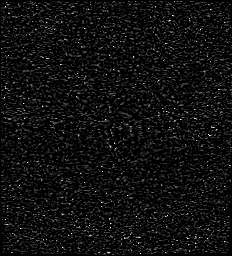

[Series 12: swi_images · axial · 3.0mm · 0.90mm/px · z∈[-86,+88]mm · 6 of 60 slices shown]
[im 1/60]
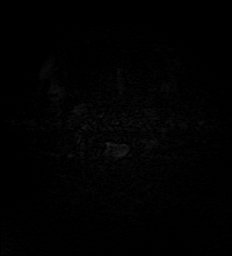
[im 12/60]
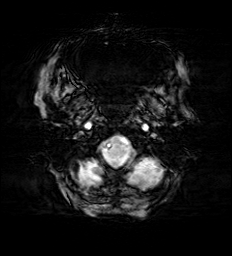
[im 24/60]
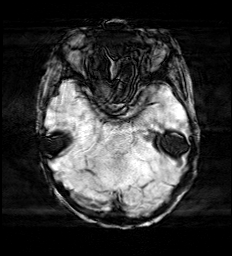
[im 36/60]
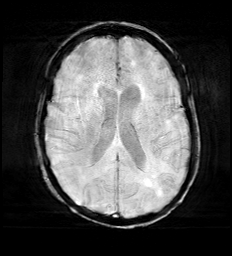
[im 48/60]
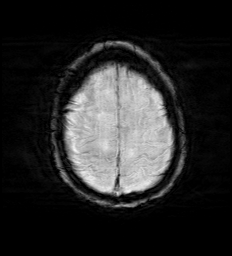
[im 60/60]
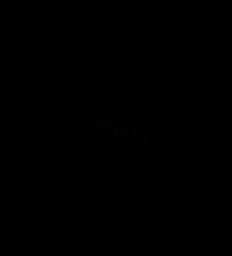

[Series 14: T1 · sagittal · 5.0mm · 0.94mm/px · 2 of 23 slices shown (1 of 2)]
[im 1/23]
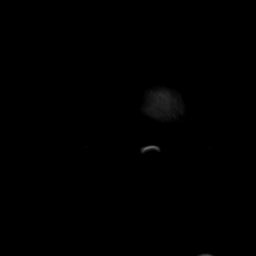
[im 23/23]
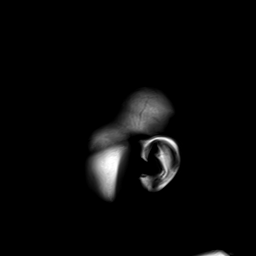

[Series 15: T2 · axial · 5.0mm · 0.45mm/px · z∈[-76,+78]mm · 3 of 27 slices shown (1 of 2)]
[im 1/27]
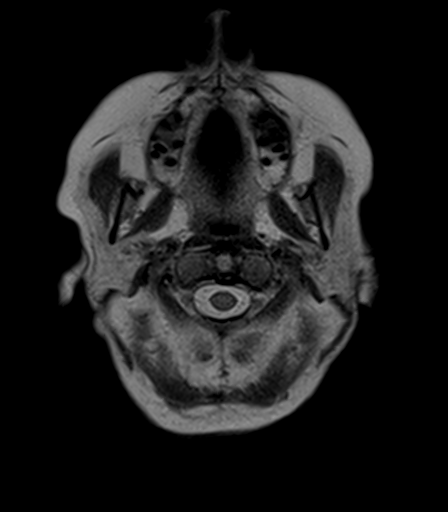
[im 14/27]
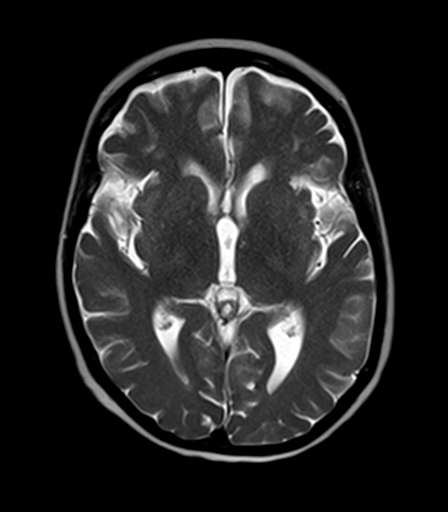
[im 27/27]
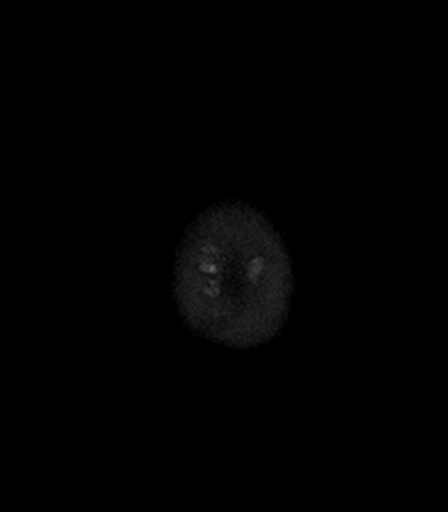

[Series 16: T1 · axial · 5.0mm · 0.90mm/px · z∈[-76,+78]mm · 3 of 27 slices shown (2 of 2)]
[im 1/27]
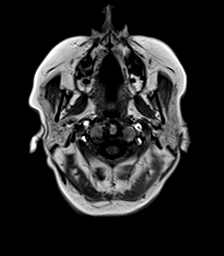
[im 14/27]
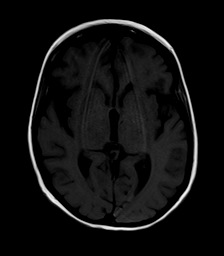
[im 27/27]
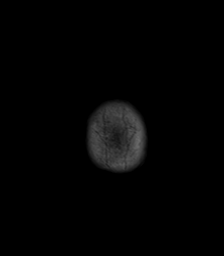

[Series 17: T2 · coronal · 5.0mm · 0.45mm/px · 3 of 31 slices shown (2 of 2)]
[im 1/31]
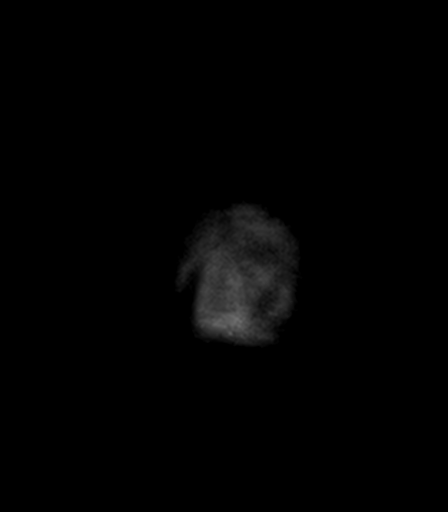
[im 16/31]
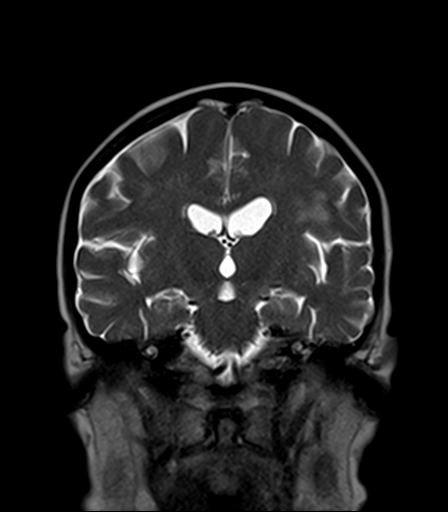
[im 31/31]
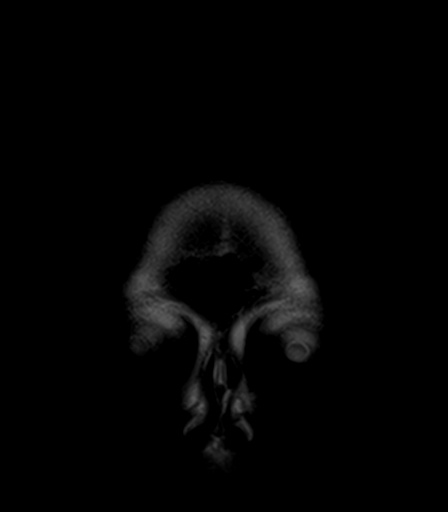

[48 of 48 positions shown; findings below may reference images not displayed]

FINDINGS: Brain: Punctate focus of restricted diffusion in the right frontal
white matter (series [DATE], image 36), suspicious for tiny acute
infarct. No significant edema or mass effect. Moderate scattered T2
hyperintensities in the white matter, nonspecific but compatible
with chronic microvascular disease. No evidence of acute hemorrhage,
hydrocephalus, mass lesion, midline shift, or extra-axial fluid
collection.

Vascular: Major arterial flow voids are maintained at the skull
base.

Skull and upper cervical spine: Normal marrow signal.

Sinuses/Orbits: Clear sinuses.  Unremarkable orbits.

Other: Small bilateral mastoid effusions.
IMPRESSION: 1. Punctate focus of restricted diffusion in the right frontal white
matter, suspicious for tiny acute infarct. No significant edema or
mass effect.
2. Moderate chronic microvascular ischemic disease.

## 2021-01-11 IMAGING — DX DG ABD PORTABLE 1V
2 series · 2 of 2 positions shown · non-contrast
Comparison: CT abdomen pelvis [DATE]

CLINICAL DATA: Colitis, weakness.

EXAM:
PORTABLE ABDOMEN - 1 VIEW

[abdomen supine (1 of 2)]
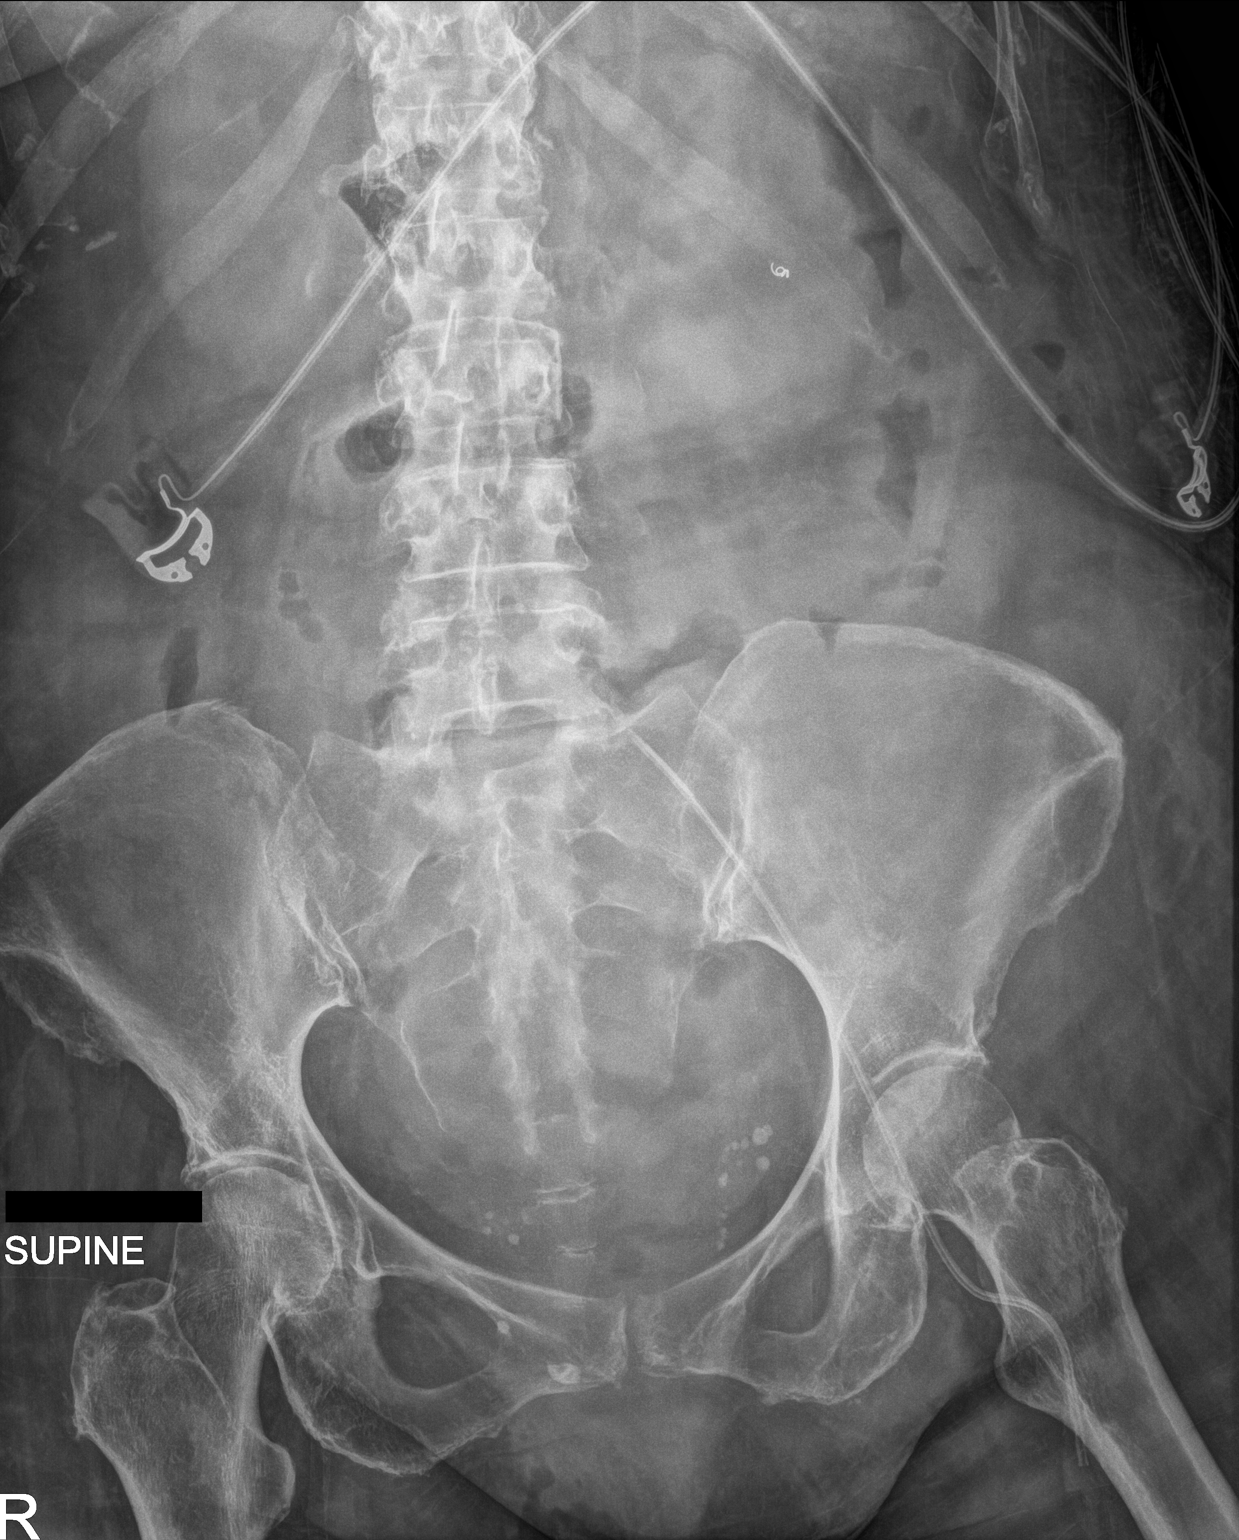

[abdomen supine (2 of 2)]
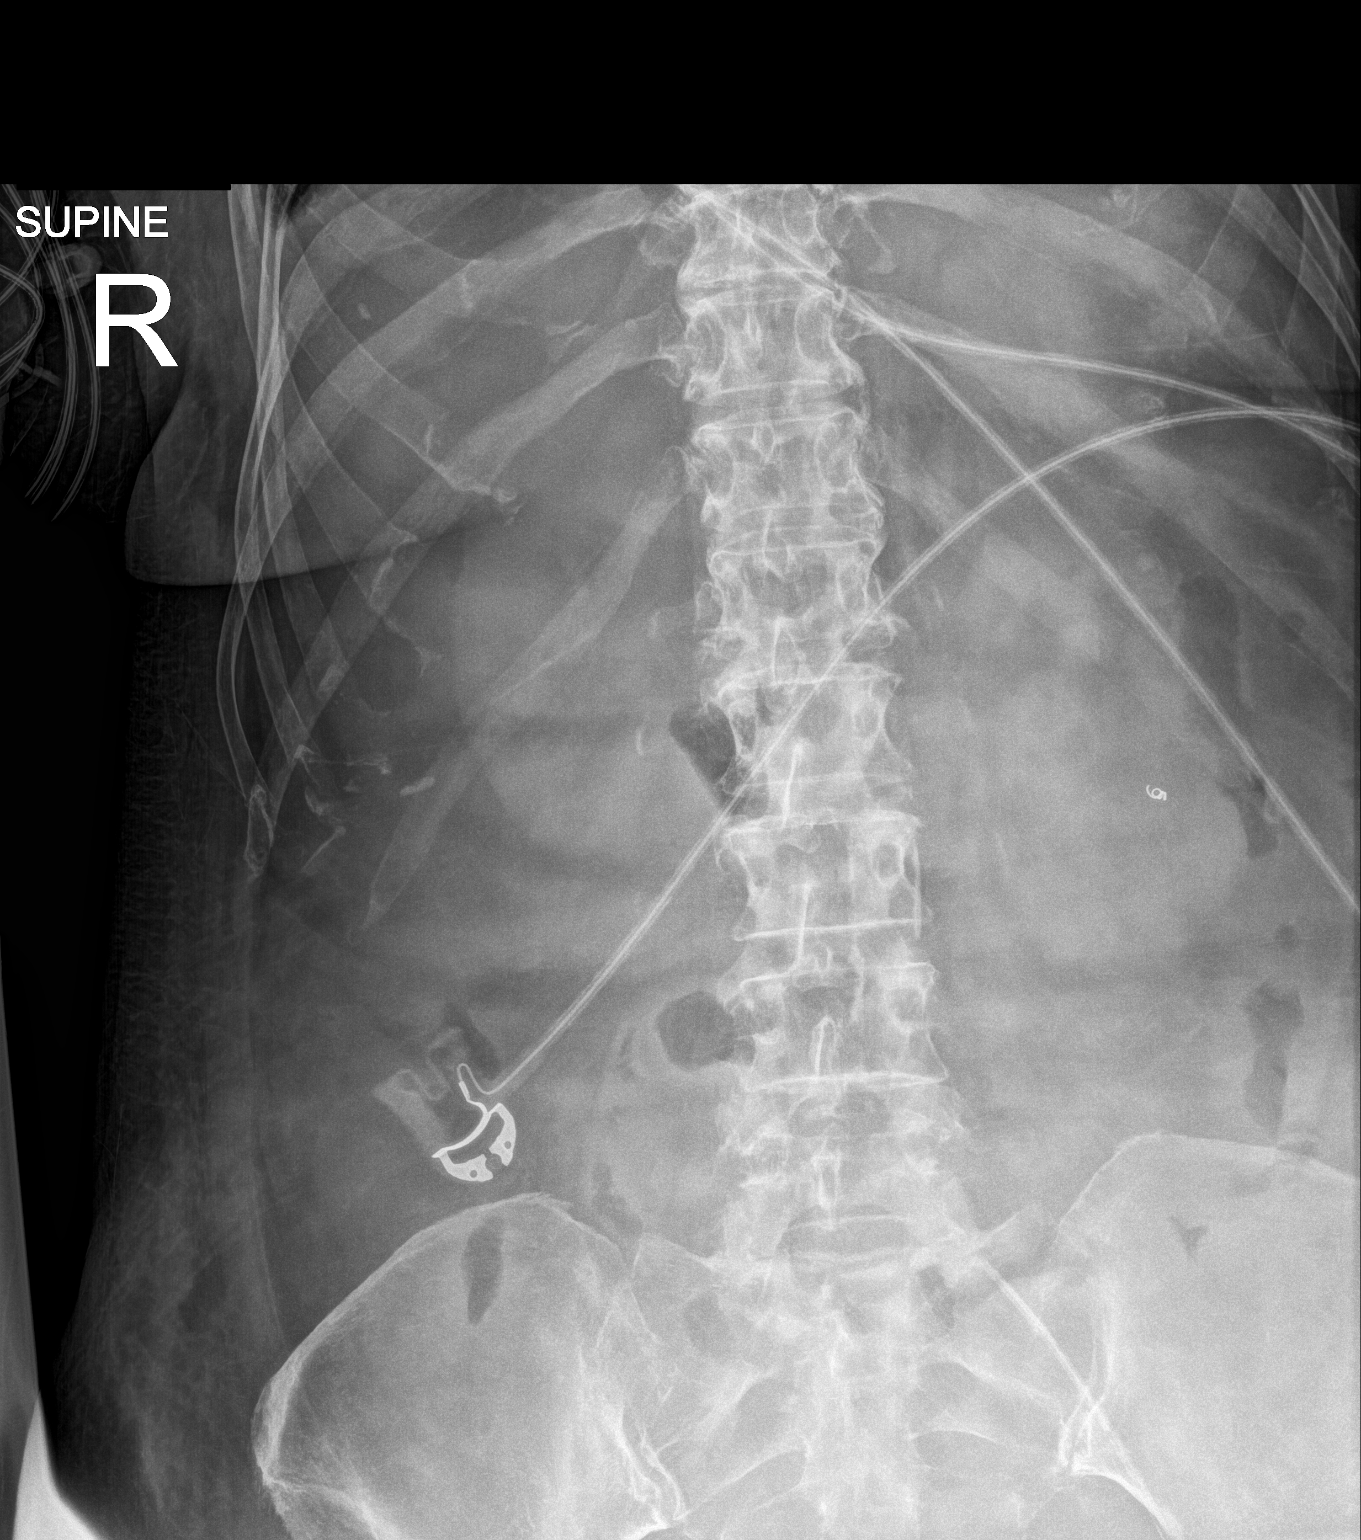

[2 of 2 positions shown; findings below may reference images not displayed]

FINDINGS: Scattered air is seen in the small bowel and colon with a
nonobstructive bowel gas pattern. No free intraperitoneal air given
the limits of the supine images. Left femoral central venous
catheter tip projects at the level of the left common iliac vein.
Left renal embolization coil. A 1 cm nodular opacity overlying the
right lung base is favored to represent a nipple shadow.
IMPRESSION: 1. Nonobstructive bowel gas pattern.
2. Interval placement of left femoral central venous catheter with
tip overlying the expected location of the left common iliac vein.

## 2021-01-11 MED ORDER — MELATONIN 5 MG PO TABS
5.0000 mg | ORAL_TABLET | Freq: Every evening | ORAL | Status: DC | PRN
  Administered 2021-01-12: 5 mg via ORAL
  Filled 2021-01-11: qty 1

## 2021-01-11 MED ORDER — DEXTROSE 50 % IV SOLN
12.5000 g | INTRAVENOUS | Status: AC
  Administered 2021-01-11: 12.5 g via INTRAVENOUS

## 2021-01-11 MED ORDER — HYDROCORTISONE SOD SUC (PF) 100 MG IJ SOLR
100.0000 mg | Freq: Three times a day (TID) | INTRAMUSCULAR | Status: DC
  Administered 2021-01-11 – 2021-01-13 (×6): 100 mg via INTRAVENOUS
  Filled 2021-01-11 (×6): qty 2

## 2021-01-11 MED ORDER — HEPARIN SODIUM (PORCINE) 5000 UNIT/ML IJ SOLN
5000.0000 [IU] | Freq: Three times a day (TID) | INTRAMUSCULAR | Status: DC
  Administered 2021-01-11 – 2021-01-15 (×12): 5000 [IU] via SUBCUTANEOUS
  Filled 2021-01-11 (×12): qty 1

## 2021-01-11 MED ORDER — PANTOPRAZOLE SODIUM 40 MG IV SOLR
40.0000 mg | INTRAVENOUS | Status: DC
  Administered 2021-01-11 – 2021-01-15 (×5): 40 mg via INTRAVENOUS
  Filled 2021-01-11 (×5): qty 40

## 2021-01-11 NOTE — Progress Notes (Signed)
NAME:  Lauren Bradshaw, MRN:  [id number], DOB:  03-03-1951, LOS: 0 ADMISSION DATE:  12/26/2020 CONSULTATION DATE:  12/13/2020             REFERRING MD:  Dr. Kerman Passey CHIEF COMPLAINT:  Weakness, lethargy and abdominal pain     BRIEF SYNOPSIS:  Lauren Bradshaw is a 70 y/o F who presented to [facility] ED for weakness, lethargy, diarrhea, and abdominal pain after recent d/c from rehab for c.diff, Afib, PNA, and ANCA renal vasculitis. Patient placed in ICU care for hemodynamic instability secondary to hypovolemic septic shock d/t pancolitis with multiorgan failure with resp distress, renal failure, cardiac failure with afib, severe leukopenia with severe metabolic acidosis and encephalopathy.   History of Present Illness:  Lauren Bradshaw is a 71 year old female with a history of atrial fibrillation, c. Diff., HTN, HDL, and vasculitis who presents to [place] ED with complaints of progressive weakness and lethargy. 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 Diff. (She was meant to FU with both renal and pulmonary specialists this week to determine further plan of care.)   She was discharged to her son [name]) and daughter-in-law's [name]) home able to pivot transfer and was doing quite well. She was reported to have formed stools upon discharge. However, over the weekend, she developed severe diarrhea, abdominal pain, and progressive weakness and lethargy. She had decreased oral intake of both food and water. Patient was unable to get out of bed, EMS was called. She was transferred to [facility] and not back to [facility] d/t positive stroke screen for slurred speech. Code stroke was activated.    Pertinent  Medical History  Atrial fibrillation  C. Diff HTN HDL ANCA renal  vasculitis  PNA w/ respiratory failure  Hypothyroidism    Significant Hospital Events: Including procedures, antibiotic start and stop dates in addition to other pertinent events   ED course: Code stroke activated, CTH obtained, but further testing deferred d/t hemodynamic instability. Patient was given amiodarone bolus for AFIB w/ RVR.    10/31>> The critical care team was consulted for hemodynamic instability. Patient was seen in ED bed, appears to be critically ill - complaining of abdominal pain, CT abdomen/pelvis ordered. Will be transferred to critical care for further workup and treatment.  11/1>> CT abdomen/pelvis confirms pancolitis. Surgery on board for possible colectomy with end iliostomy if worsening condition or increasing lactate. Continue treatment for sepsis and supportive care.  11/2>> Continue to trend lactate. Continue antibiotic treatment, pressure support as needed. No fever overnight. Increasing WBCs and neutrophils. Kidney function stable.      Micro Data:  10/31>> Blood culture pending  10/31>> Stool PCR - positive for C.Diff    Antimicrobials:  10/31>> metronidazole, vancomycin, cefepime (1x dose)  11/1>> metronidazole, PO vancomycin    Interim History / Subjective:  Patient required minimal pressure support last night, currently supporting own pressure. No fever overnight. Increasing WBCs and neutrophils. Kidney function stable. Patient is not feeling well today, endorses continuing severe abdominal pain. Continue treatment for pancolitis & sepsis with IV/PO abx to avoid total colectomy. Surgery following.     Objective   Blood pressure (!) 106/57, pulse (!) 124, temperature (!) 97.5 F (36.4 C), temperature source Oral, resp. rate (!) 32, height '5\' 4"'  (  1.626 m), weight 54.7 kg, SpO2 97 %.    >         Intake/Output Summary (Last 24 hours) at 01/08/2021 1642 Last data filed at 12/10/2020 1639    Gross per 24 hour  Intake 2500.67 ml  Output --  Net  2500.67 ml       Filed Weights    12/27/2020 1013  Weight: 54.7 kg      REVIEW OF SYSTEMS Review of Systems  Constitutional:  Positive for malaise/fatigue (Feels worse today).  Respiratory:  Positive for cough and shortness of breath.   Cardiovascular:  Negative for chest pain.  Gastrointestinal:  Positive for abdominal pain. Negative for nausea and vomiting.  Neurological:  Negative for headaches.  Endo/Heme/Allergies:  Bruises/bleeds easily.    PHYSICAL EXAMINATION:   GENERAL: Ill appearing, pallor. A&Ox4.  EYES: Pupils equal, round, reactive to light. No scleral icterus. L eye exophthalmos.  MOUTH: Dry mucous membranes.  PULMONARY: Tachypnea, moderately increased WOB. Symmetric chest wall movement. Coarse lung sounds R upper lobe. Adequate airflow.  CARDIOVASCULAR: Atrial fibrillation w/ RVR. No MRG. GASTROINTESTINAL: Severe tenderness to palpation and percussion. Moderately distended. Positive bowel sounds.  MUSCULOSKELETAL:  3+ pitting edema BLE. 2+ BUE.  NEUROLOGIC: A&Ox4. Generalized weakness, but no focal neurologic deficits.  SKIN: Diffuse petechia BUE, minimal petechia BLE.    Labs/imaging that I havepersonally reviewed  (right click and "Reselect all SmartList Selections" daily)  10/31>> CTH- There is no acute intracranial hemorrhage or evidence of acute infarction. ASPECT score is 10. 10/31 >> CXR- New airspace opacity in the right suprahilar lung likely due to pneumonia. Radiographic follow-up to resolution is recommended to exclude developing mass. 10/31 >> CT abdomen/pelvis - pancolitis. Left renal subscapular fluid collection, likely hematoma d/t recent renal biopsy.  11/1>> CXR- persistent medial right upper lobe airspace consolidation concerning for pneumonia. Followup PA and lateral chest X-ray is recommended in 3-4 weeks following trial of antibiotic therapy to ensure resolution and exclude underlying malignancy. 11/1>> [place] BLE- No evidence of DVT within either  lower extremity. 11/1>> Echo- EF 60-65%, mild LAE (overall normal heart function) 11/2>> KUB - nonobstructive bowel gas pattern. No free intraperitoneal air seen.     Resolved Hospital Problem list       ASSESSMENT AND PLAN   SYNOPSIS: Lauren Bradshaw is a 70 y/o F who presented to [facility] ED for weakness, lethargy, diarrhea, and abdominal pain after recent d/c from rehab for c.diff, Afib, PNA, and ANCA renal vasculitis. Patient placed in ICU care for hemodynamic instability secondary to hypovolemic septic shock d/t pancolitis with multiorgan failure with resp distress, renal failure, cardiac failure with afib, hypoglycemia, severe leukopenia with severe metabolic acidosis and encephalopathy.   #Hypovolemic shock   #Septic shock #Metabolic acidosis with respiratory compensation (resolved) - Source: c. Diff pancolitis  - Fluid resuscitation as needed.   - Continue PO vanc & flagyl - Use vasopressors as needed for MAP>65 (not currently needing pressors)  - Trend CBC, procal, lactate, and VBGs; follow up cultures  - Start stress-dose Solu-Cortef  6.4-->5.3-->4.2-->2.1   #Abdominal pain  #C.diff Pancolitis  - Extreme abdominal tenderness to palpation and percussion  - C. Diff PCR +   - CT abdomen/pelvis w/ contrast -pancolitis, renal hematoma, no abscess, no perforation - Per surgery - will try to treat pancolitis with aggressive abx to avoid surgery. 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 Will continue to trend lactate, follow fever curve; if continues to elevate or be significantly elevated, will  plan for urgent surgery. - Serial exams and AXRs, trend lactate q3 hrs - Zofran & fentanyl as needed for pain control   #Possible persistent PNA   - 11/1>> CXR: Persistent medial right upper lobe airspace consolidation concerning for pneumonia. Followup PA and lateral chest X-ray is recommended in 3-4 weeks following trial of antibiotic therapy to ensure resolution and exclude  underlying malignancy.  - Will consider broadening ABX coverage with cefepime tomorrow (11/3) in case of under-treated persisting PNA - would be helpful to have records from [facility] to determine previous course of treatment with PNA in August. Do not want to worsen C.diff.    #Electrolyte derangement  #Hyponatremia  #Hypokalemia  - Trend labs  - Pharmacy consultation, replace (phos, mag, K+) as needed  #Leukopenia  #Neutropenia  #Immunosuppression  #Acute anemia  - Per Oncology-  neutropenia likely multifactorial from medications and illness and not related to Rituxan and Cytoxan infusion from 2 month ago. No bone marrow biopsy required at this time. Hold Granix as WBC count is improving and patient is afebrile. Continue to monitor hemoglobin.  - Trend CBC (look for increase in WBCs and stable H&H)  - Neutropenic precautions   #Afib w/ RVR  - No current anticoagulation, was on Eliquis, but was discontinued several weeks ago d/t downtrending H&H.  - Amiodarone drip decreased by half to 12m/hr for rate control  - Tachycardia likely driven by sepsis   #Acute kidney injury/Renal Failure -continue Foley Catheter-assess need -Avoid nephrotoxic agents -Follow BMP & urine output (only 100 out overnight); continue to monitor -Ensure adequate renal perfusion, optimize oxygenation -Renal dose medications   Intake/Output Summary (Last 24 hours) at 01/03/2021 1657 Last data filed at 12/24/2020 1639    Gross per 24 hour  Intake 2500.67 ml  Output --  Net 2500.67 ml    BMP Latest Ref Rng & Units 01/06/2021  Glucose 70 - 99 mg/dL 59(L)  BUN 8 - 23 mg/dL 28(H)  Creatinine 0.44 - 1.00 mg/dL 1.70(H)  Sodium 135 - 145 mmol/L 128(L)  Potassium 3.5 - 5.1 mmol/L 3.3(L)  Chloride 98 - 111 mmol/L 101  CO2 22 - 32 mmol/L 14(L)  Calcium 8.9 - 10.3 mg/dL 7.6(L)    #Hypothyroidism  - Levothyroxine 75 mcg once a day   Best practice (right click and "Reselect all SmartList Selections" daily)  Diet:  NPO Pain/Anxiety/Delirium protocol (if indicated): No VAP protocol (if indicated): Not indicated DVT prophylaxis: Contraindicated GI prophylaxis: N/A Glucose control:  SSI No Central venous access:  N/A Arterial line:  N/A Foley:  N/A Mobility:  bed rest  Code Status:  FULL Disposition:ICU    DVT/GI PRX  assessed I Assessed the need for Labs I Assessed the need for Foley I Assessed the need for Central Venous Line Family Discussion when available I Assessed the need for Mobilization I made an Assessment of medications to be adjusted accordingly Safety Risk assessment completed  CASE DISCUSSED IN MULTIDISCIPLINARY ROUNDS WITH ICU TEAM    Critical Care Time devoted to patient care services described in this note is 55 minutes.  Critical care was necessary to treat /prevent imminent and life-threatening deterioration. Overall, patient is critically ill, prognosis is guarded.  Patient with Multiorgan failure and at high risk for cardiac arrest and death.    KCorrin Parker M.D.  LVelora HecklerPulmonary & Critical Care Medicine  Medical Director ISan RafaelDirector AFsc Invest[facility] LLCCardio-Pulmonary Department

## 2021-01-11 NOTE — Progress Notes (Addendum)
[facility] SURGICAL PROGRESS NOTE (cpt [phone])  Hospital Day(s): 2.   Interval History: Patient seen and examined. No fevers in the last 24 hours. Her leukopenia is improving with WBC up to 2.5K this morning. Hgb is stable at 9.1. Renal function elevated but stable with sCr - 1.61; UO - 300 ccs. Previous hypokalemia resolved and otherwise no significant electrolyte derangements. Lactic acid level continued to make small improvements yesterday (5.3 --> 4.7 --> 4.6 --> 4.2).   This morning, she continues to have relatively diffuse abdominal pain. No emesis, fever, or distension. She again needed a small amount (1-2 mcg) of norepinephrine overnight but this has been stopped this morning. She continues to be in atrial fibrillation with RVR; on amiodarone; cardiology following. Oncology also following for neutropenia. She is NPO. She continues on IV Flagyl and PO Vancomycin   Review of Systems:  Constitutional: denied fever, chills  HEENT: denies cough or congestion  Respiratory: denies any shortness of breath  Cardiovascular: denies chest pain or palpitations  Gastrointestinal: + abdominal pain, denied nausea, emesis Genitourinary: denies burning with urination or urinary frequency  Vital signs in last 24 hours: [min-max] current  Temp:  [97.2 F (36.2 C)-98.2 F (36.8 C)] 97.9 F (36.6 C) (11/01 2040) Pulse Rate:  [54-151] 130 (11/02 0715) Resp:  [18-42] 31 (11/02 0715) BP: (78-119)/(48-84) 102/65 (11/02 0715) SpO2:  [91 %-97 %] 94 % (11/02 0715)     Height: 5\' 4"  (162.6 cm) Weight: 54.7 kg     Intake/Output last 2 shifts:  11/01 0701 - 11/02 0700 In: 2517.2 [I.V.:1111.4; IV Piggyback:1405.8] Out: 300 [Urine:300]   Physical Exam:  Constitutional: alert, cooperative and no distress  HENT: normocephalic without obvious abnormality  Eyes: PERRL, EOM's grossly intact and symmetric  Respiratory: NAD, tachypneic   Cardiovascular: Tachycardic, irregular Gastrointestinal:  Soft, she appears more diffusely tender today compared to yesterday, non-distended Musculoskeletal: no edema or wounds, motor and sensation grossly intact, NT    Labs:  CBC Latest Ref Rng & Units 01/11/2021 01/10/2021 01/10/2021  WBC 4.0 - 10.5 K/uL 2.5(L) - -  Hemoglobin 12.0 - 15.0 g/dL 9.1(L) 9.6(L) 9.1(L)  Hematocrit 36.0 - 46.0 % 26.2(L) 27.8(L) 25.7(L)  Platelets 150 - 400 K/uL 156 - -   CMP Latest Ref Rng & Units 01/11/2021 01/10/2021 01/10/2021  Glucose 70 - 99 mg/dL 78 - 111(H)  BUN 8 - 23 mg/dL 28(H) - 28(H)  Creatinine 0.44 - 1.00 mg/dL 1.61(H) - 1.61(H)  Sodium 135 - 145 mmol/L 129(L) - 132(L)  Potassium 3.5 - 5.1 mmol/L 3.8 3.2(L) 2.7(LL)  Chloride 98 - 111 mmol/L 94(L) - 93(L)  CO2 22 - 32 mmol/L 26 - 23  Calcium 8.9 - 10.3 mg/dL 6.7(L) - 6.7(L)  Total Protein 6.5 - 8.1 g/dL - - 3.8(L)  Total Bilirubin 0.3 - 1.2 mg/dL - - 0.8  Alkaline Phos 38 - 126 U/L - - 130(H)  AST 15 - 41 U/L - - 18  ALT 0 - 44 U/L - - 10    Imaging studies: No new pertinent imaging studies   Assessment/Plan: (ICD-10's: A70.72) 70 y.o. female admitted with pancolitis secondary to Clostridium difficile, without overt evidence of toxic megacolon or impending perforation currently, complicated by atrial fibrillation with RVR   - Will add on continued lactic acid levels  - Her laboratory values have made some improvements in the last 24 hours and she has remained without fevers, which is encouraging. She does continue to have somewhat diffuse, unchanged, abdominal pain  on examination. I do not think we are quite "out of the woods" just yet. We can continue to monitor closely, but at any sign of deterioration or failure to improve would have low threshold for going to the OR for colectomy and end ileostomy.    - Recommend continued NPO; may need to consider parenteral nutrition in a few days - Continue IV Flagyl and PO Vancomycin   - Monitor abdominal examination closely +/- serial AXR   - Continue IVF  support - Monitor leukopenia; slight improvement - Monitor renal function; UO - Monitor lactic acid level; making slow improvements - Appreciate cardiology assistance - Appreciate oncology input  - Appreciate PCCM assistance; we will follow    All of the above findings and recommendations were discussed with the patient, and the medical team, and all of patient's questions were answered to her expressed satisfaction.  -- Lynden Oxford, PA-C [facility] 01/11/2021, 7:33 AM [phone] M-F: 7am - 4pm

## 2021-01-11 NOTE — Progress Notes (Signed)
PHARMACY CONSULT NOTE - FOLLOW UP  Pharmacy Consult for Electrolyte Monitoring and Replacement   Recent Labs: Potassium (mmol/L)  Date Value  01/11/2021 3.8   Magnesium (mg/dL)  Date Value  30/16/0109 2.4   Calcium (mg/dL)  Date Value  32/35/5732 6.7 (L)   Albumin (g/dL)  Date Value  20/25/4270 1.6 (L)   Phosphorus (mg/dL)  Date Value  62/37/6283 2.7   Sodium (mmol/L)  Date Value  01/11/2021 129 (L)   Corr Ca: 8.6 mg/dL (Alb 1.6)  Assessment: 70 y.o. female who presents emergency department for generalized fatigue/weakness. Pharmacy has been consulted for electrolyte replacement.   On amiodarone gtt, on NE (in Afib) Qtc (baz) w VR of145  Scr: 1.7>1.65>1.61 Na: 133>132>129 K: 2.7>3.2>3.8 Mg: 2.4>1.9 Phos: 2.9>2.7  Goal of Therapy:  K 4 - 5.1 mmol/L Mg 2 - 2.4 mg/dL All other electrolytes WNL  Plan:  Defer management of hyponatremia to PCCM K/Mg/Phos returned WNL Recheck electrolytes with AM labs  Martyn Malay 01/11/2021 11:39 AM

## 2021-01-11 NOTE — Progress Notes (Signed)
GOALS OF CARE DISCUSSION  The Clinical status was relayed to family in detail. Daughter In law Updated and notified of patients medical condition.    Patient with increased WOB and using accessory muscles to breathe Explained to family course of therapy and the modalities  Patient claims she is tired   Patient with Progressive multiorgan failure with a very high probablity of a very minimal chance of meaningful recovery despite all aggressive and optimal medical therapy.  PATIENT REMAINS FULL CODE  Family understands the situation.  Family are satisfied with Plan of action and management. All questions answered  Additional CC time 35 mins   Marti Mclane Santiago Glad, M.D.  [facility]  Medical Director [facility] Medical Director [facility] Cardio-Pulmonary Department

## 2021-01-11 NOTE — Progress Notes (Addendum)
Amio gtt rate cut in half @ 30. HR remains in 130s.   No need for levo throughout shift.   Pt taken for MRI today.  Pt's pain controlled w/ PRN fent.   Foley remains in place. 225 out.

## 2021-01-11 NOTE — Progress Notes (Signed)
[facility]  CARDIOLOGY CONSULT NOTE  Patient ID: Lauren Bradshaw MRN: :[id number] DOB/AGE: 1950/03/30 70 y.o.  Admit date: 12/31/2020 Referring Physician Harvest Dark Primary Physician Kateri Mc, MD Primary Cardiologist NA Reason for Consultation AF with RVR  HPI:  Laruen Wahlstrom is a 70 year old female with a history of atrial fibrillation, hypertension, hyperlipidemia, vasculitis who presents to the emergency department with generalized weakness and was subsequently discovered to be in atrial fibrillation with RVR in the setting of sepsis and multisystem organ failure.   Interval history: - Continues with AF with RVR.  - Still has severe abdominal pain.   Review of systems complete and found to be negative unless listed above     Past Medical History:  Diagnosis Date   Atrial fibrillation (HCC)    C. difficile diarrhea    GERD (gastroesophageal reflux disease)    Hyperlipidemia    Hypertension    Insomnia    Seizure ([place])    Thyroid disease    Vasculitis ([place])       Medications Prior to Admission  Medication Sig Dispense Refill Last Dose   amLODipine (NORVASC) 10 MG tablet Take 10 mg by mouth daily.      atenolol (TENORMIN) 25 MG tablet Take 25 mg by mouth daily.      atorvastatin (LIPITOR) 40 MG tablet Take 40 mg by mouth daily.      carvedilol (COREG) 6.25 MG tablet Take 6.25 mg by mouth 2 (two) times daily.      diclofenac (VOLTAREN) 75 MG EC tablet Take 75 mg by mouth 2 (two) times daily.      furosemide (LASIX) 20 MG tablet Take 20 mg by mouth 2 (two) times daily.      gabapentin (NEURONTIN) 600 MG tablet Take 600 mg by mouth 4 (four) times daily.      ibuprofen (ADVIL) 800 MG tablet Take 800 mg by mouth 3 (three) times daily.      levothyroxine (SYNTHROID) 75 MCG tablet Take 75 mcg by mouth daily.      metoprolol succinate (TOPROL-XL) 50 MG 24 hr tablet Take 50 mg by mouth daily.      montelukast (SINGULAIR) 10 MG tablet Take 10 mg by mouth at bedtime.       omeprazole (PRILOSEC) 40 MG capsule Take 1 capsule by mouth in the morning.      ondansetron (ZOFRAN) 4 MG tablet Take by mouth.      pantoprazole (PROTONIX) 40 MG tablet Take 40 mg by mouth 2 (two) times daily.      potassium chloride SA (KLOR-CON) 20 MEQ tablet Take 20 mEq by mouth daily.      rosuvastatin (CRESTOR) 20 MG tablet Take 20 mg by mouth at bedtime.      triamterene-hydrochlorothiazide (DYAZIDE) 37.5-25 MG capsule Take 1 capsule by mouth every morning.      cefUROXime (CEFTIN) 500 MG tablet Take 500 mg by mouth 2 (two) times daily. (Patient not taking: No sig reported)   Completed Course    Social History   Socioeconomic History   Marital status: Married    Spouse name: Not on file   Number of children: Not on file   Years of education: Not on file   Highest education level: Not on file  Occupational History   Not on file  Tobacco Use   Smoking status: Never   Smokeless tobacco: Never  Substance and Sexual Activity   Alcohol use: Not Currently   Drug use: Not on file  Sexual activity: Not on file  Other Topics Concern   Not on file  Social History Narrative   Not on file   Social Determinants of Health   Financial Resource Strain: Not on file  Food Insecurity: Not on file  Transportation Needs: Not on file  Physical Activity: Not on file  Stress: Not on file  Social Connections: Not on file  Intimate Partner Violence: Not on file    No family history on file.    Review of systems complete and found to be negative unless listed above      PHYSICAL EXAM  General: Lethargic appearing with elevated respiratory rate. HEENT:  Normocephalic and atramatic Neck:  No JVD.  Lungs: Clear in anterior lung fields.  Tachypneic. Heart: Irregularly irregular.  Tachycardic.Marland Kitchen Normal S1 and S2 without gallops or murmurs.  Abdomen: Bowel sounds are positive, abdomen soft and non-tender  Msk:  Back normal, normal gait. Normal strength and tone for age. Extremities:  No clubbing, cyanosis or edema.   Neuro: Unable to answer questions.  No obvious focal deficits. Psych: Unable to accurately assess.  Labs:   Lab Results  Component Value Date   WBC 2.5 (L) 01/11/2021   HGB 9.1 (L) 01/11/2021   HCT 26.2 (L) 01/11/2021   MCV 82.6 01/11/2021   PLT 156 01/11/2021    Recent Labs  Lab 01/10/21 1425 01/10/21 2032 01/11/21 0450  NA 132*  --  129*  K 2.7*   < > 3.8  CL 93*  --  94*  CO2 23  --  26  BUN 28*  --  28*  CREATININE 1.61*  --  1.61*  CALCIUM 6.7*  --  6.7*  PROT 3.8*  --   --   BILITOT 0.8  --   --   ALKPHOS 130*  --   --   ALT 10  --   --   AST 18  --   --   GLUCOSE 111*  --  78   < > = values in this interval not displayed.    No results found for: CKTOTAL, CKMB, CKMBINDEX, TROPONINI No results found for: CHOL No results found for: HDL No results found for: LDLCALC No results found for: TRIG No results found for: CHOLHDL No results found for: LDLDIRECT    Radiology: CT ABDOMEN PELVIS WO CONTRAST  Addendum Date: 01/08/2021   ADDENDUM REPORT: 01/08/2021 16:17 ADDENDUM: Additional clinical history has been provided. The patient recently underwent a random left renal biopsy a few weeks ago that a required embolization. As such, the left renal subcapsular fluid collection likely represents old hematoma and the previously described 4 mm left renal calculus is likely a vascular coil. Electronically Signed   By: Titus Dubin M.D.   On: 12/16/2020 16:17   Result Date: 12/21/2020 CLINICAL DATA:  Diarrhea and weakness. EXAM: CT ABDOMEN AND PELVIS WITHOUT CONTRAST TECHNIQUE: Multidetector CT imaging of the abdomen and pelvis was performed following the standard protocol without IV contrast. COMPARISON:  None. FINDINGS: Lower chest: Small bilateral pleural effusions. Trace pericardial effusion. Mild bibasilar atelectasis/scarring. Hepatobiliary: Small calcification in the right hepatic lobe. No other focal liver abnormality. Mildly  distended gallbladder without wall thickening or radiopaque gallstones. No biliary dilatation. Pancreas: Unremarkable. No pancreatic ductal dilatation or surrounding inflammatory changes. Spleen: Normal in size without focal abnormality. Adrenals/Urinary Tract: The adrenal glands and right kidney are unremarkable. 3.9 x 5.5 x 8.7 cm low-density left renal subcapsular fluid collection (series 2, image 36; series 6,  image 83). 4 mm calculus in the left kidney. No hydronephrosis. The bladder is unremarkable. Stomach/Bowel: Moderate hiatal hernia. The stomach is otherwise within normal limits. Severe circumferential wall thickening of the entire colon. No pneumatosis. The small bowel is unremarkable. Normal appendix. Vascular/Lymphatic: Aortic atherosclerosis. No enlarged abdominal or pelvic lymph nodes. Reproductive: Prior hysterectomy. 3.9 cm simple appearing cyst in the right ovary. Other: Trace ascites around the liver, in both paracolic gutters, and in the pelvis. Prominent presacral soft tissue stranding. No pneumoperitoneum. Musculoskeletal: No acute or significant osseous findings. IMPRESSION: 1. Severe circumferential wall thickening of the entire colon, consistent with pancolitis. 2. 8.7 cm low-density left renal subcapsular fluid collection, concerning for abscess. Correlate with urinalysis. 3. Trace ascites.  Small bilateral pleural effusions. 4. 3.9 cm simple appearing right ovarian cyst. Recommend outpatient follow-up with pelvic US. Reference: JACR 2020 Feb;17(2):248-254 5. Aortic Atherosclerosis (ICD10-I70.0). Electronically Signed: By: Titus Dubin M.D. On: 12/23/2020 16:01   DG Chest 1 View  Result Date: 01/10/2021 CLINICAL DATA:  70 year old female with history of shortness of breath. EXAM: CHEST  1 VIEW COMPARISON:  Chest x-ray 12/25/2020. FINDINGS: Lung volumes are low. Persistent ill-defined opacity in the medial aspect of the right upper lobe, unchanged. Left lung appears clear. Known small  bilateral pleural effusions are not readily apparent on this single AP view. No pneumothorax. No evidence of pulmonary edema. Heart size is borderline enlarged. Atherosclerotic calcifications in the thoracic aorta. IMPRESSION: 1. Persistent medial right upper lobe airspace consolidation concerning for pneumonia. Followup PA and lateral chest X-ray is recommended in 3-4 weeks following trial of antibiotic therapy to ensure resolution and exclude underlying malignancy. 2. Aortic atherosclerosis. 3. Small bilateral pleural effusions noted on yesterday's CT the abdomen and pelvis are not readily apparent on today's plain film examination. Electronically Signed   By: Vinnie Langton M.D.   On: 01/10/2021 05:31   US Venous Img Lower Bilateral (DVT)  Result Date: 01/10/2021 CLINICAL DATA:  Bilateral lower extremity pain and edema. Evaluate for DVT. EXAM: BILATERAL LOWER EXTREMITY VENOUS DOPPLER ULTRASOUND TECHNIQUE: Gray-scale sonography with graded compression, as well as color Doppler and duplex ultrasound were performed to evaluate the lower extremity deep venous systems from the level of the common femoral vein and including the common femoral, femoral, profunda femoral, popliteal and calf veins including the posterior tibial, peroneal and gastrocnemius veins when visible. The superficial great saphenous vein was also interrogated. Spectral Doppler was utilized to evaluate flow at rest and with distal augmentation maneuvers in the common femoral, femoral and popliteal veins. COMPARISON:  None. FINDINGS: RIGHT LOWER EXTREMITY Common Femoral Vein: No evidence of thrombus. Normal compressibility, respiratory phasicity and response to augmentation. Saphenofemoral Junction: No evidence of thrombus. Normal compressibility and flow on color Doppler imaging. Profunda Femoral Vein: No evidence of thrombus. Normal compressibility and flow on color Doppler imaging. Femoral Vein: No evidence of thrombus. Normal  compressibility, respiratory phasicity and response to augmentation. Popliteal Vein: No evidence of thrombus. Normal compressibility, respiratory phasicity and response to augmentation. Calf Veins: No evidence of thrombus. Normal compressibility and flow on color Doppler imaging. Superficial Great Saphenous Vein: No evidence of thrombus. Normal compressibility. Venous Reflux:  None. Other Findings:  None. LEFT LOWER EXTREMITY Common Femoral Vein: Not visualized secondary to bandage associated with left femoral approach central venous catheter. Saphenofemoral Junction: Not visualized secondary to bandages so shaded with left femoral approach central venous catheter. Profunda Femoral Vein: No evidence of thrombus. Normal compressibility and flow on color Doppler imaging. Femoral Vein:  No evidence of thrombus. Normal compressibility, respiratory phasicity and response to augmentation. Popliteal Vein: No evidence of thrombus. Normal compressibility, respiratory phasicity and response to augmentation. Calf Veins: No evidence of thrombus. Normal compressibility and flow on color Doppler imaging. Superficial Great Saphenous Vein: No evidence of thrombus. Normal compressibility. Venous Reflux:  None. Other Findings:  None. IMPRESSION: No evidence of DVT within either lower extremity. Electronically Signed   By: Simonne Come M.D.   On: 01/10/2021 08:12   DG Chest Port 1 View  Result Date: February 02, 2021 CLINICAL DATA:  Possible sepsis Right-sided weakness and lethargic EXAM: PORTABLE CHEST 1 VIEW COMPARISON:  01/07/2019 FINDINGS: Heart size within normal limits. Mild pulmonary vascular congestion. There is new airspace opacity in the right suprahilar lung. IMPRESSION: New airspace opacity in the right suprahilar lung likely due to pneumonia. Radiographic follow-up to resolution is recommended to exclude developing mass. Electronically Signed   By: Acquanetta Belling M.D.   On: February 02, 2021 14:08   ECHOCARDIOGRAM COMPLETE  Result  Date: 01/10/2021    ECHOCARDIOGRAM REPORT   Patient Name:   BAILI STANG Date of Exam: 01/10/2021 Medical Rec #:  [id number]  Height:       64.0 in Accession #:    [id number] Weight:       120.6 lb Date of Birth:  24-Jan-1951   BSA:          1.578 m Patient Age:    70 years   BP:           94/58 mmHg Patient Gender: F          HR:           132 bpm. Exam Location:  [facility] Procedure: 2D Echo, Cardiac Doppler and Color Doppler Indications:     Atrial Fibrillation I48.91  History:         Patient has no prior history of Echocardiogram examinations.                  Arrythmias:Atrial Fibrillation; Risk Factors:Hypertension and                  Dyslipidemia.  Sonographer:     Cristela Blue Referring Phys:  [id number] Trixie Deis Yechezkel Fertig Diagnosing Phys: Sena Slate  Sonographer Comments: Suboptimal apical window. IMPRESSIONS  1. Left ventricular ejection fraction, by estimation, is 60 to 65%. The left ventricle has normal function. The left ventricle has no regional wall motion abnormalities. Left ventricular diastolic parameters are indeterminate.  2. Right ventricular systolic function was not well visualized. The right ventricular size is not well visualized.  3. Left atrial size was mildly dilated.  4. The mitral valve is normal in structure. No evidence of mitral valve regurgitation. No evidence of mitral stenosis.  5. The aortic valve was not well visualized. Aortic valve regurgitation is not visualized.  6. The inferior vena cava is normal in size with greater than 50% respiratory variability, suggesting right atrial pressure of 3 mmHg. FINDINGS  Left Ventricle: Left ventricular ejection fraction, by estimation, is 60 to 65%. The left ventricle has normal function. The left ventricle has no regional wall motion abnormalities. The left ventricular internal cavity size was small. There is no left ventricular hypertrophy. Left ventricular diastolic parameters are indeterminate. Right Ventricle: The right ventricular size is not well  visualized. Right vetricular wall thickness was not well visualized. Right ventricular systolic function was not well visualized. Left Atrium: Left atrial size was mildly dilated. Right Atrium: Right atrial size was not  well visualized. Pericardium: There is no evidence of pericardial effusion. Mitral Valve: The mitral valve is normal in structure. No evidence of mitral valve regurgitation. No evidence of mitral valve stenosis. MV peak gradient, 7.0 mmHg. The mean mitral valve gradient is 3.0 mmHg. Tricuspid Valve: The tricuspid valve is not well visualized. Tricuspid valve regurgitation is not demonstrated. Aortic Valve: The aortic valve was not well visualized. Aortic valve regurgitation is not visualized. Aortic valve mean gradient measures 3.0 mmHg. Aortic valve peak gradient measures 6.9 mmHg. Aortic valve area, by VTI measures 2.54 cm. Pulmonic Valve: The pulmonic valve was not well visualized. Pulmonic valve regurgitation is not visualized. No evidence of pulmonic stenosis. Aorta: The aortic root was not well visualized. Venous: The inferior vena cava is normal in size with greater than 50% respiratory variability, suggesting right atrial pressure of 3 mmHg. IAS/Shunts: The interatrial septum was not well visualized.  LEFT VENTRICLE PLAX 2D LVIDd:         3.97 cm   Diastology LVIDs:         2.89 cm   LV e' medial:    9.68 cm/s LV PW:         1.23 cm   LV E/e' medial:  9.9 LV IVS:        0.85 cm   LV e' lateral:   10.80 cm/s LVOT diam:     2.00 cm   LV E/e' lateral: 8.8 LV SV:         38 LV SV Index:   24 LVOT Area:     3.14 cm  RIGHT VENTRICLE RV Basal diam:  3.53 cm RV S prime:     14.90 cm/s TAPSE (M-mode): 3.8 cm LEFT ATRIUM             Index        RIGHT ATRIUM           Index LA diam:        4.50 cm 2.85 cm/m   RA Area:     17.00 cm LA Vol (A2C):   44.1 ml 27.95 ml/m  RA Volume:   49.00 ml  31.05 ml/m LA Vol (A4C):   72.0 ml 45.63 ml/m LA Biplane Vol: 59.0 ml 37.39 ml/m  AORTIC VALVE                     PULMONIC VALVE AV Area (Vmax):    2.45 cm     PV Vmax:        1.04 m/s AV Area (Vmean):   2.76 cm     PV Vmean:       71.600 cm/s AV Area (VTI):     2.54 cm     PV VTI:         0.137 m AV Vmax:           131.00 cm/s  PV Peak grad:   4.3 mmHg AV Vmean:          79.800 cm/s  PV Mean grad:   2.0 mmHg AV VTI:            0.151 m      RVOT Peak grad: 4 mmHg AV Peak Grad:      6.9 mmHg AV Mean Grad:      3.0 mmHg LVOT Vmax:         102.00 cm/s LVOT Vmean:        70.000 cm/s LVOT VTI:  0.122 m LVOT/AV VTI ratio: 0.81  AORTA Ao Root diam: 2.80 cm MITRAL VALVE               TRICUSPID VALVE MV Area (PHT): 3.21 cm    TR Peak grad:   9.2 mmHg MV Area VTI:   2.25 cm    TR Vmax:        152.00 cm/s MV Peak grad:  7.0 mmHg MV Mean grad:  3.0 mmHg    SHUNTS MV Vmax:       1.32 m/s    Systemic VTI:  0.12 m MV Vmean:      81.1 cm/s   Systemic Diam: 2.00 cm MV Decel Time: 236 msec    Pulmonic VTI:  0.111 m MV E velocity: 95.50 cm/s MV A velocity: 83.10 cm/s MV E/A ratio:  1.15 Donnelly Angelica Electronically signed by Donnelly Angelica Signature Date/Time: 01/10/2021/1:17:36 PM    Final    CT HEAD CODE STROKE WO CONTRAST  Result Date: 12/21/2020 CLINICAL DATA:  Code stroke. EXAM: CT HEAD WITHOUT CONTRAST TECHNIQUE: Contiguous axial images were obtained from the base of the skull through the vertex without intravenous contrast. COMPARISON:  None. FINDINGS: Brain: There is no acute intracranial hemorrhage, mass effect, or edema. Gray-white differentiation is preserved. Ventricles and sulci are normal in size and configuration. Patchy low-density in the supratentorial white matter is nonspecific but may reflect chronic microvascular ischemic changes. No extra-axial collection. Vascular: No hyperdense vessel. There is intracranial atherosclerotic calcification at the skull base. Skull: Unremarkable. Sinuses/Orbits: No acute or significant abnormality. Other: Mastoid air cells are clear. ASPECTS ([place] Stroke Program Early CT  Score) - Ganglionic level infarction (caudate, lentiform nuclei, internal capsule, insula, M1-M3 cortex): 7 - Supraganglionic infarction (M4-M6 cortex): 3 Total score (0-10 with 10 being normal): 10 IMPRESSION: There is no acute intracranial hemorrhage or evidence of acute infarction. ASPECT score is 10. Chronic microvascular ischemic changes. These results were called by telephone at the time of interpretation on 12/26/2020 at 10:10 am to provider [facility] , who verbally acknowledged these results. Electronically Signed   By: Macy Mis M.D.   On: 12/28/2020 10:11    EKG: Atrial fibrillation with RVR.  ASSESSMENT AND PLAN:  Lachon Marinko is a 70 year old female with a history of atrial fibrillation, hypertension, hyperlipidemia, vasculitis who presents to the emergency department with generalized weakness and was subsequently discovered to be in atrial fibrillation with RVR. She also has multisystem organ failure possibly related to sepsis.  # AF with RVR # Multisystem organ failure, Severe sepsis The patient presents with fatigue and was subsequently discovered to have neutropenia, elevated creatinine, and altered mental status.  The etiology of this is unclear but certainly could be concerning for severe sepsis d/t C. difficile.  In the setting she has atrial fibrillation with RVR to the 150s with relative hypotension.  The atrial fibrillation is certainly a reflection of her severe disease state. Echo shows normal EF. -Recommend ongoing management of medical issues per primary inpatient team. -Continue IV amiodarone.  - When BP is improved can consider addition or beta blocker. -Would hold on anticoagulation at this time given her recent bleeding issues and need for transfusion. - If she develops worsened hypotension thought to be related to AF, follow ACLS guidelines.    Signed: Andrez Grime MD 01/11/2021, 8:04 AM

## 2021-01-12 DIAGNOSIS — A0472 Enterocolitis due to Clostridium difficile, not specified as recurrent: Secondary | ICD-10-CM | POA: Diagnosis not present

## 2021-01-12 DIAGNOSIS — I4891 Unspecified atrial fibrillation: Secondary | ICD-10-CM | POA: Diagnosis not present

## 2021-01-12 LAB — CBC
HCT: 28.3 % — ABNORMAL LOW (ref 36.0–46.0)
Hemoglobin: 9.5 g/dL — ABNORMAL LOW (ref 12.0–15.0)
MCH: 28 pg (ref 26.0–34.0)
MCHC: 33.6 g/dL (ref 30.0–36.0)
MCV: 83.5 fL (ref 80.0–100.0)
Platelets: 123 10*3/uL — ABNORMAL LOW (ref 150–400)
RBC: 3.39 MIL/uL — ABNORMAL LOW (ref 3.87–5.11)
RDW: 20.9 % — ABNORMAL HIGH (ref 11.5–15.5)
WBC: 5.1 10*3/uL (ref 4.0–10.5)
nRBC: 0 % (ref 0.0–0.2)

## 2021-01-12 LAB — LEGIONELLA PNEUMOPHILA SEROGP 1 UR AG: L. pneumophila Serogp 1 Ur Ag: NEGATIVE

## 2021-01-12 LAB — BASIC METABOLIC PANEL
Anion gap: 11 (ref 5–15)
BUN: 30 mg/dL — ABNORMAL HIGH (ref 8–23)
CO2: 24 mmol/L (ref 22–32)
Calcium: 7.1 mg/dL — ABNORMAL LOW (ref 8.9–10.3)
Chloride: 93 mmol/L — ABNORMAL LOW (ref 98–111)
Creatinine, Ser: 1.82 mg/dL — ABNORMAL HIGH (ref 0.44–1.00)
GFR, Estimated: 30 mL/min — ABNORMAL LOW (ref >60)
Glucose, Bld: 112 mg/dL — ABNORMAL HIGH (ref 70–99)
Potassium: 3.7 mmol/L (ref 3.5–5.1)
Sodium: 128 mmol/L — ABNORMAL LOW (ref 135–145)

## 2021-01-12 LAB — GLUCOSE, CAPILLARY
Glucose-Capillary: 69 mg/dL — ABNORMAL LOW (ref 70–99)
Glucose-Capillary: 112 mg/dL — ABNORMAL HIGH (ref 70–99)
Glucose-Capillary: 100 mg/dL — ABNORMAL HIGH (ref 70–99)
Glucose-Capillary: 108 mg/dL — ABNORMAL HIGH (ref 70–99)
Glucose-Capillary: 92 mg/dL (ref 70–99)
Glucose-Capillary: 87 mg/dL (ref 70–99)
Glucose-Capillary: 131 mg/dL — ABNORMAL HIGH (ref 70–99)

## 2021-01-12 LAB — MAGNESIUM: Magnesium: 2.5 mg/dL — ABNORMAL HIGH (ref 1.7–2.4)

## 2021-01-12 LAB — PHOSPHORUS: Phosphorus: 3.5 mg/dL (ref 2.5–4.6)

## 2021-01-12 MED ORDER — CALCIUM GLUCONATE-NACL 1-0.675 GM/50ML-% IV SOLN
1.0000 g | Freq: Once | INTRAVENOUS | Status: AC
  Administered 2021-01-12: 1000 mg via INTRAVENOUS
  Filled 2021-01-12: qty 50

## 2021-01-12 NOTE — Progress Notes (Signed)
PHARMACY CONSULT NOTE - FOLLOW UP  Pharmacy Consult for Electrolyte Monitoring and Replacement   Recent Labs: Potassium (mmol/L)  Date Value  01/12/2021 3.7   Magnesium (mg/dL)  Date Value  19/75/8832 2.5 (H)   Calcium (mg/dL)  Date Value  54/98/2641 7.1 (L)   Albumin (g/dL)  Date Value  58/30/9407 1.6 (L)   Phosphorus (mg/dL)  Date Value  68/10/8108 3.5   Sodium (mmol/L)  Date Value  01/12/2021 128 (L)    Assessment: 70 y.o. female who presents emergency department for generalized fatigue/weakness. Pharmacy has been consulted for electrolyte replacement.    Goal of Therapy:  K 4 - 5.1 mmol/L Mg 2 - 2.4 mg/dL All other electrolytes WNL  Plan:  No electrolyte replacement indicated Follow up with morning labs  Pricilla Riffle, PharmD, BCPS Clinical Pharmacist 01/12/2021 12:06 PM

## 2021-01-12 NOTE — Progress Notes (Signed)
OT Cancellation Note  Patient Details Name: Arianie Couse MRN: [id number] DOB: September 10, 1950   Cancelled Treatment:    Reason Eval/Treat Not Completed: Medical issues which prohibited therapy. Per chart, plan to hold therapy until HR is better controlled. Continues to be tachycardic, will initiate services next date as pt appropriate.   Kathie Dike, M.S. OTR/L  01/12/21, 1:59 PM  ascom [phone]

## 2021-01-12 NOTE — Progress Notes (Signed)
[facility] PROGRESS NOTE (cpt [phone])  Hospital Day(s): 3.   Interval History: Patient seen and examined, no acute events or new complaints overnight. Her WBC has returned to normal values this morning; now 5.1. Hgb remains stable at 9.5.  Renal function is slightly worse this morning with sCr -1.82; UO - 425 ccs. Persistent hyponatremia to 128 otherwise no significant electrolyte derangements. Lactic acid levels normalized last night.   This morning, patient reports she is feeling improved and objectively looks better. No fever, chills, nausea, emesis. She has remained off vasopressor support. She continues to be in atrial fibrillation with RVR; on amiodarone; cardiology following. Oncology also following for neutropenia. She is NPO. She continues on IV Flagyl and PO Vancomycin    Review of Systems:  Constitutional: denied fever, chills  HEENT: denies cough or congestion  Respiratory: denies any shortness of breath  Cardiovascular: denies chest pain or palpitations  Gastrointestinal: + abdominal pain, denied nausea, emesis Genitourinary: denies burning with urination or urinary frequency  Vital signs in last 24 hours: [min-max] current  Temp:  [96.8 F (36 C)-98 F (36.7 C)] 98 F (36.7 C) (11/03 0330) Pulse Rate:  [40-145] 40 (11/03 0700) Resp:  [18-40] 31 (11/03 0700) BP: (73-121)/(54-88) 121/84 (11/03 0700) SpO2:  [91 %-99 %] 94 % (11/03 0700)     Height: 5\' 4"  (162.6 cm) Weight: 54.7 kg     Intake/Output last 2 shifts:  11/02 0701 - 11/03 0700 In: 1316 [I.V.:816; IV Piggyback:500] Out: 425 [Urine:425]   Physical Exam:  Constitutional: alert, cooperative and no distress, sitting up in bed this morning HENT: normocephalic without obvious abnormality  Eyes: PERRL, EOM's grossly intact and symmetric  Respiratory: NAD, tachypneic   Cardiovascular: Tachycardic, irregular Gastrointestinal: Soft, she is still tender LLQ and LUQ but exam is improved,  non-distended Musculoskeletal: no edema or wounds, motor and sensation grossly intact, NT    Labs:  CBC Latest Ref Rng & Units 01/12/2021 01/11/2021 01/10/2021  WBC 4.0 - 10.5 K/uL 5.1 2.5(L) -  Hemoglobin 12.0 - 15.0 g/dL 9.5(L) 9.1(L) 9.6(L)  Hematocrit 36.0 - 46.0 % 28.3(L) 26.2(L) 27.8(L)  Platelets 150 - 400 K/uL 123(L) 156 -   CMP Latest Ref Rng & Units 01/12/2021 01/11/2021 01/10/2021  Glucose 70 - 99 mg/dL 112(H) 78 -  BUN 8 - 23 mg/dL 30(H) 28(H) -  Creatinine 0.44 - 1.00 mg/dL 1.82(H) 1.61(H) -  Sodium 135 - 145 mmol/L 128(L) 129(L) -  Potassium 3.5 - 5.1 mmol/L 3.7 3.8 3.2(L)  Chloride 98 - 111 mmol/L 93(L) 94(L) -  CO2 22 - 32 mmol/L 24 26 -  Calcium 8.9 - 10.3 mg/dL 7.1(L) 6.7(L) -  Total Protein 6.5 - 8.1 g/dL - - -  Total Bilirubin 0.3 - 1.2 mg/dL - - -  Alkaline Phos 38 - 126 U/L - - -  AST 15 - 41 U/L - - -  ALT 0 - 44 U/L - - -     Imaging studies: No new pertinent imaging studies   Assessment/Plan: (ICD-10's: A6.72) 70 y.o. female admitted with pancolitis secondary to Clostridium difficile, without overt evidence of toxic megacolon or impending perforation currently, complicated by atrial fibrillation with RVR  - thankfully, she has made objective improvements clinically and her laboratory values continue to improve. I do not think we are quite "out of the woods" just yet, but she is making positive improvements. At any sign of deterioration or failure to improve would have low threshold for going to the  OR for colectomy and end ileostomy.               - Recommend continued NPO; may be able to initiate CLD tonight or tomorrow morning  - Continue IV Flagyl and PO Vancomycin              - Monitor abdominal examination closely +/- serial AXR              - Continue IVF support - Monitor leukopenia; resolved - Monitor renal function; UO - Monitor lactic acid level; normalized - Appreciate cardiology assistance - Appreciate oncology input  - Appreciate PCCM  assistance; we will follow     All of the above findings and recommendations were discussed with the patient, and the medical team, and all of patient's questions were answered to her expressed satisfaction.  -- Lynden Oxford, PA-C [facility] 01/12/2021, 7:16 AM [phone] M-F: 7am - 4pm

## 2021-01-12 NOTE — Progress Notes (Signed)
PT Cancellation Note  Patient Details Name: Lauren Bradshaw MRN: [id number] DOB: 1951-01-23   Cancelled Treatment:    Reason Eval/Treat Not Completed: Medical issues which prohibited therapy (per monitor, pt remains tachycardic, rates is 130-140s.) Discussed with RN and Dr. Belia Heman. Will hold off until HR is better controlled.   1:33 PM, 01/12/21 Rosamaria Lints, PT, DPT Physical Therapist - [facility]  [phone] (ASCOM)     [name] 01/12/2021, 1:32 PM

## 2021-01-12 NOTE — Progress Notes (Signed)
[facility] Cardiology  CARDIOLOGY CONSULT NOTE  Patient ID: Lauren Bradshaw MRN: [id number] DOB/AGE: April 11, 1950 70 y.o.  Admit date: 12/14/2020 Referring Physician Harvest Dark Primary Physician Kateri Mc, MD Primary Cardiologist NA Reason for Consultation AF with RVR  HPI:  Lauren Bradshaw is a 70 year old female with a history of atrial fibrillation, hypertension, hyperlipidemia, vasculitis who presents to the emergency department with generalized weakness and was subsequently discovered to be in atrial fibrillation with RVR in the setting of sepsis and multisystem organ failure.   Interval history: - No acute events. Feels better today.  - Still with AF with RVR in 140's.   Review of systems complete and found to be negative unless listed above     Past Medical History:  Diagnosis Date   Atrial fibrillation (HCC)    C. difficile diarrhea    GERD (gastroesophageal reflux disease)    Hyperlipidemia    Hypertension    Insomnia    Seizure ([place])    Thyroid disease    Vasculitis ([place])       Medications Prior to Admission  Medication Sig Dispense Refill Last Dose   amLODipine (NORVASC) 10 MG tablet Take 10 mg by mouth daily.      atenolol (TENORMIN) 25 MG tablet Take 25 mg by mouth daily.      atorvastatin (LIPITOR) 40 MG tablet Take 40 mg by mouth daily.      carvedilol (COREG) 6.25 MG tablet Take 6.25 mg by mouth 2 (two) times daily.      diclofenac (VOLTAREN) 75 MG EC tablet Take 75 mg by mouth 2 (two) times daily.      furosemide (LASIX) 20 MG tablet Take 20 mg by mouth 2 (two) times daily.      gabapentin (NEURONTIN) 600 MG tablet Take 600 mg by mouth 4 (four) times daily.      ibuprofen (ADVIL) 800 MG tablet Take 800 mg by mouth 3 (three) times daily.      levothyroxine (SYNTHROID) 75 MCG tablet Take 75 mcg by mouth daily.      metoprolol succinate (TOPROL-XL) 50 MG 24 hr tablet Take 50 mg by mouth daily.      montelukast (SINGULAIR) 10 MG tablet Take 10 mg by mouth at  bedtime.      omeprazole (PRILOSEC) 40 MG capsule Take 1 capsule by mouth in the morning.      ondansetron (ZOFRAN) 4 MG tablet Take by mouth.      pantoprazole (PROTONIX) 40 MG tablet Take 40 mg by mouth 2 (two) times daily.      potassium chloride SA (KLOR-CON) 20 MEQ tablet Take 20 mEq by mouth daily.      rosuvastatin (CRESTOR) 20 MG tablet Take 20 mg by mouth at bedtime.      triamterene-hydrochlorothiazide (DYAZIDE) 37.5-25 MG capsule Take 1 capsule by mouth every morning.      cefUROXime (CEFTIN) 500 MG tablet Take 500 mg by mouth 2 (two) times daily. (Patient not taking: No sig reported)   Completed Course    Social History   Socioeconomic History   Marital status: Married    Spouse name: Not on file   Number of children: Not on file   Years of education: Not on file   Highest education level: Not on file  Occupational History   Not on file  Tobacco Use   Smoking status: Never   Smokeless tobacco: Never  Substance and Sexual Activity   Alcohol use: Not Currently   Drug use:  Not on file   Sexual activity: Not on file  Other Topics Concern   Not on file  Social History Narrative   Not on file   Social Determinants of Health   Financial Resource Strain: Not on file  Food Insecurity: Not on file  Transportation Needs: Not on file  Physical Activity: Not on file  Stress: Not on file  Social Connections: Not on file  Intimate Partner Violence: Not on file    No family history on file.    Review of systems complete and found to be negative unless listed above      PHYSICAL EXAM  General: Lethargic appearing with elevated respiratory rate. HEENT:  Normocephalic and atramatic Neck:  No JVD.  Lungs: Clear in anterior lung fields.  Tachypneic. Heart: Irregularly irregular.  Tachycardic.Marland Kitchen Normal S1 and S2 without gallops or murmurs.  Abdomen: Bowel sounds are positive, abdomen soft and non-tender  Msk:  Back normal, normal gait. Normal strength and tone for  age. Extremities: No clubbing, cyanosis or edema.   Neuro: Unable to answer questions.  No obvious focal deficits. Psych: Unable to accurately assess.  Labs:   Lab Results  Component Value Date   WBC 5.1 01/12/2021   HGB 9.5 (L) 01/12/2021   HCT 28.3 (L) 01/12/2021   MCV 83.5 01/12/2021   PLT 123 (L) 01/12/2021    Recent Labs  Lab 01/10/21 1425 01/10/21 2032 01/12/21 0330  NA 132*   < > 128*  K 2.7*   < > 3.7  CL 93*   < > 93*  CO2 23   < > 24  BUN 28*   < > 30*  CREATININE 1.61*   < > 1.82*  CALCIUM 6.7*   < > 7.1*  PROT 3.8*  --   --   BILITOT 0.8  --   --   ALKPHOS 130*  --   --   ALT 10  --   --   AST 18  --   --   GLUCOSE 111*   < > 112*   < > = values in this interval not displayed.    No results found for: CKTOTAL, CKMB, CKMBINDEX, TROPONINI No results found for: CHOL No results found for: HDL No results found for: LDLCALC No results found for: TRIG No results found for: CHOLHDL No results found for: LDLDIRECT    Radiology: CT ABDOMEN PELVIS WO CONTRAST  Addendum Date: 12/18/2020   ADDENDUM REPORT: 12/30/2020 16:17 ADDENDUM: Additional clinical history has been provided. The patient recently underwent a random left renal biopsy a few weeks ago that a required embolization. As such, the left renal subcapsular fluid collection likely represents old hematoma and the previously described 4 mm left renal calculus is likely a vascular coil. Electronically Signed   By: Titus Dubin M.D.   On: 12/19/2020 16:17   Result Date: 12/18/2020 CLINICAL DATA:  Diarrhea and weakness. EXAM: CT ABDOMEN AND PELVIS WITHOUT CONTRAST TECHNIQUE: Multidetector CT imaging of the abdomen and pelvis was performed following the standard protocol without IV contrast. COMPARISON:  None. FINDINGS: Lower chest: Small bilateral pleural effusions. Trace pericardial effusion. Mild bibasilar atelectasis/scarring. Hepatobiliary: Small calcification in the right hepatic lobe. No other focal liver  abnormality. Mildly distended gallbladder without wall thickening or radiopaque gallstones. No biliary dilatation. Pancreas: Unremarkable. No pancreatic ductal dilatation or surrounding inflammatory changes. Spleen: Normal in size without focal abnormality. Adrenals/Urinary Tract: The adrenal glands and right kidney are unremarkable. 3.9 x 5.5 x 8.7 cm  low-density left renal subcapsular fluid collection (series 2, image 36; series 6, image 83). 4 mm calculus in the left kidney. No hydronephrosis. The bladder is unremarkable. Stomach/Bowel: Moderate hiatal hernia. The stomach is otherwise within normal limits. Severe circumferential wall thickening of the entire colon. No pneumatosis. The small bowel is unremarkable. Normal appendix. Vascular/Lymphatic: Aortic atherosclerosis. No enlarged abdominal or pelvic lymph nodes. Reproductive: Prior hysterectomy. 3.9 cm simple appearing cyst in the right ovary. Other: Trace ascites around the liver, in both paracolic gutters, and in the pelvis. Prominent presacral soft tissue stranding. No pneumoperitoneum. Musculoskeletal: No acute or significant osseous findings. IMPRESSION: 1. Severe circumferential wall thickening of the entire colon, consistent with pancolitis. 2. 8.7 cm low-density left renal subcapsular fluid collection, concerning for abscess. Correlate with urinalysis. 3. Trace ascites.  Small bilateral pleural effusions. 4. 3.9 cm simple appearing right ovarian cyst. Recommend outpatient follow-up with pelvic US. Reference: JACR 2020 Feb;17(2):248-254 5. Aortic Atherosclerosis (ICD10-I70.0). Electronically Signed: By: Titus Dubin M.D. On: 01/04/2021 16:01   DG Chest 1 View  Result Date: 01/10/2021 CLINICAL DATA:  70 year old female with history of shortness of breath. EXAM: CHEST  1 VIEW COMPARISON:  Chest x-ray 12/15/2020. FINDINGS: Lung volumes are low. Persistent ill-defined opacity in the medial aspect of the right upper lobe, unchanged. Left lung  appears clear. Known small bilateral pleural effusions are not readily apparent on this single AP view. No pneumothorax. No evidence of pulmonary edema. Heart size is borderline enlarged. Atherosclerotic calcifications in the thoracic aorta. IMPRESSION: 1. Persistent medial right upper lobe airspace consolidation concerning for pneumonia. Followup PA and lateral chest X-ray is recommended in 3-4 weeks following trial of antibiotic therapy to ensure resolution and exclude underlying malignancy. 2. Aortic atherosclerosis. 3. Small bilateral pleural effusions noted on yesterday's CT the abdomen and pelvis are not readily apparent on today's plain film examination. Electronically Signed   By: Vinnie Langton M.D.   On: 01/10/2021 05:31   MR BRAIN WO CONTRAST  Result Date: 01/11/2021 CLINICAL DATA:  Neuro deficit, acute, stroke suspected EXAM: MRI HEAD WITHOUT CONTRAST TECHNIQUE: Multiplanar, multiecho pulse sequences of the brain and surrounding structures were obtained without intravenous contrast. COMPARISON:  January 09, 2021. FINDINGS: Brain: Punctate focus of restricted diffusion in the right frontal white matter (series 5/6, image 36), suspicious for tiny acute infarct. No significant edema or mass effect. Moderate scattered T2 hyperintensities in the white matter, nonspecific but compatible with chronic microvascular disease. No evidence of acute hemorrhage, hydrocephalus, mass lesion, midline shift, or extra-axial fluid collection. Vascular: Major arterial flow voids are maintained at the skull base. Skull and upper cervical spine: Normal marrow signal. Sinuses/Orbits: Clear sinuses.  Unremarkable orbits. Other: Small bilateral mastoid effusions. IMPRESSION: 1. Punctate focus of restricted diffusion in the right frontal white matter, suspicious for tiny acute infarct. No significant edema or mass effect. 2. Moderate chronic microvascular ischemic disease. Electronically Signed   By: Margaretha Sheffield M.D.    On: 01/11/2021 14:26   MR THORACIC SPINE WO CONTRAST  Result Date: 01/11/2021 CLINICAL DATA:  Right-sided weakness and lethargy. Possible myelopathy. EXAM: MRI THORACIC SPINE WITHOUT CONTRAST TECHNIQUE: Multiplanar, multisequence MR imaging of the thoracic spine was performed. No intravenous contrast was administered. COMPARISON:  None. FINDINGS: Alignment:  Normal Vertebrae: Normal marrow signal.  No bone lesions or fractures. Cord:  Normal cord signal intensity.  No cord lesions or syrinx. Paraspinal and other soft tissues: No significant paraspinal findings. There are moderate-sized bilateral pleural effusions noted. Disc levels: No significant  disc protrusions, spinal or foraminal stenosis. IMPRESSION: 1. Unremarkable thoracic spine MRI examination. 2. Moderate-sized bilateral pleural effusions. Electronically Signed   By: Rudie Meyer M.D.   On: 01/11/2021 14:31   US Venous Img Lower Bilateral (DVT)  Result Date: 01/10/2021 CLINICAL DATA:  Bilateral lower extremity pain and edema. Evaluate for DVT. EXAM: BILATERAL LOWER EXTREMITY VENOUS DOPPLER ULTRASOUND TECHNIQUE: Gray-scale sonography with graded compression, as well as color Doppler and duplex ultrasound were performed to evaluate the lower extremity deep venous systems from the level of the common femoral vein and including the common femoral, femoral, profunda femoral, popliteal and calf veins including the posterior tibial, peroneal and gastrocnemius veins when visible. The superficial great saphenous vein was also interrogated. Spectral Doppler was utilized to evaluate flow at rest and with distal augmentation maneuvers in the common femoral, femoral and popliteal veins. COMPARISON:  None. FINDINGS: RIGHT LOWER EXTREMITY Common Femoral Vein: No evidence of thrombus. Normal compressibility, respiratory phasicity and response to augmentation. Saphenofemoral Junction: No evidence of thrombus. Normal compressibility and flow on color Doppler  imaging. Profunda Femoral Vein: No evidence of thrombus. Normal compressibility and flow on color Doppler imaging. Femoral Vein: No evidence of thrombus. Normal compressibility, respiratory phasicity and response to augmentation. Popliteal Vein: No evidence of thrombus. Normal compressibility, respiratory phasicity and response to augmentation. Calf Veins: No evidence of thrombus. Normal compressibility and flow on color Doppler imaging. Superficial Great Saphenous Vein: No evidence of thrombus. Normal compressibility. Venous Reflux:  None. Other Findings:  None. LEFT LOWER EXTREMITY Common Femoral Vein: Not visualized secondary to bandage associated with left femoral approach central venous catheter. Saphenofemoral Junction: Not visualized secondary to bandages so shaded with left femoral approach central venous catheter. Profunda Femoral Vein: No evidence of thrombus. Normal compressibility and flow on color Doppler imaging. Femoral Vein: No evidence of thrombus. Normal compressibility, respiratory phasicity and response to augmentation. Popliteal Vein: No evidence of thrombus. Normal compressibility, respiratory phasicity and response to augmentation. Calf Veins: No evidence of thrombus. Normal compressibility and flow on color Doppler imaging. Superficial Great Saphenous Vein: No evidence of thrombus. Normal compressibility. Venous Reflux:  None. Other Findings:  None. IMPRESSION: No evidence of DVT within either lower extremity. Electronically Signed   By: Simonne Come M.D.   On: 01/10/2021 08:12   DG Chest Port 1 View  Result Date: 01-12-21 CLINICAL DATA:  Possible sepsis Right-sided weakness and lethargic EXAM: PORTABLE CHEST 1 VIEW COMPARISON:  01/07/2019 FINDINGS: Heart size within normal limits. Mild pulmonary vascular congestion. There is new airspace opacity in the right suprahilar lung. IMPRESSION: New airspace opacity in the right suprahilar lung likely due to pneumonia. Radiographic follow-up to  resolution is recommended to exclude developing mass. Electronically Signed   By: Acquanetta Belling M.D.   On: 01-12-2021 14:08   DG Abd Portable 1V  Result Date: 01/11/2021 CLINICAL DATA:  Colitis, weakness. EXAM: PORTABLE ABDOMEN - 1 VIEW COMPARISON:  CT abdomen pelvis 01/12/2021 FINDINGS: Scattered air is seen in the small bowel and colon with a nonobstructive bowel gas pattern. No free intraperitoneal air given the limits of the supine images. Left femoral central venous catheter tip projects at the level of the left common iliac vein. Left renal embolization coil. A 1 cm nodular opacity overlying the right lung base is favored to represent a nipple shadow. IMPRESSION: 1. Nonobstructive bowel gas pattern. 2. Interval placement of left femoral central venous catheter with tip overlying the expected location of the left common iliac vein. Electronically Signed  By: Ileana Roup M.D.   On: 01/11/2021 08:53   ECHOCARDIOGRAM COMPLETE  Result Date: 01/10/2021    ECHOCARDIOGRAM REPORT   Patient Name:   Lauren Bradshaw Date of Exam: 01/10/2021 Medical Rec #:  [id number]  Height:       64.0 in Accession #:    [id number] Weight:       120.6 lb Date of Birth:  1951-03-05   BSA:          1.578 m Patient Age:    59 years   BP:           94/58 mmHg Patient Gender: F          HR:           132 bpm. Exam Location:  [facility] Procedure: 2D Echo, Cardiac Doppler and Color Doppler Indications:     Atrial Fibrillation I48.91  History:         Patient has no prior history of Echocardiogram examinations.                  Arrythmias:Atrial Fibrillation; Risk Factors:Hypertension and                  Dyslipidemia.  Sonographer:     Sherrie Sport Referring Phys:  [id number] Kathrin Ruddy [place] Acklin Diagnosing Phys: Donnelly Angelica  Sonographer Comments: Suboptimal apical window. IMPRESSIONS  1. Left ventricular ejection fraction, by estimation, is 60 to 65%. The left ventricle has normal function. The left ventricle has no regional wall motion abnormalities.  Left ventricular diastolic parameters are indeterminate.  2. Right ventricular systolic function was not well visualized. The right ventricular size is not well visualized.  3. Left atrial size was mildly dilated.  4. The mitral valve is normal in structure. No evidence of mitral valve regurgitation. No evidence of mitral stenosis.  5. The aortic valve was not well visualized. Aortic valve regurgitation is not visualized.  6. The inferior vena cava is normal in size with greater than 50% respiratory variability, suggesting right atrial pressure of 3 mmHg. FINDINGS  Left Ventricle: Left ventricular ejection fraction, by estimation, is 60 to 65%. The left ventricle has normal function. The left ventricle has no regional wall motion abnormalities. The left ventricular internal cavity size was small. There is no left ventricular hypertrophy. Left ventricular diastolic parameters are indeterminate. Right Ventricle: The right ventricular size is not well visualized. Right vetricular wall thickness was not well visualized. Right ventricular systolic function was not well visualized. Left Atrium: Left atrial size was mildly dilated. Right Atrium: Right atrial size was not well visualized. Pericardium: There is no evidence of pericardial effusion. Mitral Valve: The mitral valve is normal in structure. No evidence of mitral valve regurgitation. No evidence of mitral valve stenosis. MV peak gradient, 7.0 mmHg. The mean mitral valve gradient is 3.0 mmHg. Tricuspid Valve: The tricuspid valve is not well visualized. Tricuspid valve regurgitation is not demonstrated. Aortic Valve: The aortic valve was not well visualized. Aortic valve regurgitation is not visualized. Aortic valve mean gradient measures 3.0 mmHg. Aortic valve peak gradient measures 6.9 mmHg. Aortic valve area, by VTI measures 2.54 cm. Pulmonic Valve: The pulmonic valve was not well visualized. Pulmonic valve regurgitation is not visualized. No evidence of pulmonic  stenosis. Aorta: The aortic root was not well visualized. Venous: The inferior vena cava is normal in size with greater than 50% respiratory variability, suggesting right atrial pressure of 3 mmHg. IAS/Shunts: The interatrial septum was not well visualized.  LEFT  VENTRICLE PLAX 2D LVIDd:         3.97 cm   Diastology LVIDs:         2.89 cm   LV e' medial:    9.68 cm/s LV PW:         1.23 cm   LV E/e' medial:  9.9 LV IVS:        0.85 cm   LV e' lateral:   10.80 cm/s LVOT diam:     2.00 cm   LV E/e' lateral: 8.8 LV SV:         38 LV SV Index:   24 LVOT Area:     3.14 cm  RIGHT VENTRICLE RV Basal diam:  3.53 cm RV S prime:     14.90 cm/s TAPSE (M-mode): 3.8 cm LEFT ATRIUM             Index        RIGHT ATRIUM           Index LA diam:        4.50 cm 2.85 cm/m   RA Area:     17.00 cm LA Vol (A2C):   44.1 ml 27.95 ml/m  RA Volume:   49.00 ml  31.05 ml/m LA Vol (A4C):   72.0 ml 45.63 ml/m LA Biplane Vol: 59.0 ml 37.39 ml/m  AORTIC VALVE                    PULMONIC VALVE AV Area (Vmax):    2.45 cm     PV Vmax:        1.04 m/s AV Area (Vmean):   2.76 cm     PV Vmean:       71.600 cm/s AV Area (VTI):     2.54 cm     PV VTI:         0.137 m AV Vmax:           131.00 cm/s  PV Peak grad:   4.3 mmHg AV Vmean:          79.800 cm/s  PV Mean grad:   2.0 mmHg AV VTI:            0.151 m      RVOT Peak grad: 4 mmHg AV Peak Grad:      6.9 mmHg AV Mean Grad:      3.0 mmHg LVOT Vmax:         102.00 cm/s LVOT Vmean:        70.000 cm/s LVOT VTI:          0.122 m LVOT/AV VTI ratio: 0.81  AORTA Ao Root diam: 2.80 cm MITRAL VALVE               TRICUSPID VALVE MV Area (PHT): 3.21 cm    TR Peak grad:   9.2 mmHg MV Area VTI:   2.25 cm    TR Vmax:        152.00 cm/s MV Peak grad:  7.0 mmHg MV Mean grad:  3.0 mmHg    SHUNTS MV Vmax:       1.32 m/s    Systemic VTI:  0.12 m MV Vmean:      81.1 cm/s   Systemic Diam: 2.00 cm MV Decel Time: 236 msec    Pulmonic VTI:  0.111 m MV E velocity: 95.50 cm/s MV A velocity: 83.10 cm/s MV E/A ratio:   1.15 Donnelly Angelica Electronically signed by Donnelly Angelica Signature Date/Time:  01/10/2021/1:17:36 PM    Final    CT HEAD CODE STROKE WO CONTRAST  Result Date: 12/14/2020 CLINICAL DATA:  Code stroke. EXAM: CT HEAD WITHOUT CONTRAST TECHNIQUE: Contiguous axial images were obtained from the base of the skull through the vertex without intravenous contrast. COMPARISON:  None. FINDINGS: Brain: There is no acute intracranial hemorrhage, mass effect, or edema. Gray-white differentiation is preserved. Ventricles and sulci are normal in size and configuration. Patchy low-density in the supratentorial white matter is nonspecific but may reflect chronic microvascular ischemic changes. No extra-axial collection. Vascular: No hyperdense vessel. There is intracranial atherosclerotic calcification at the skull base. Skull: Unremarkable. Sinuses/Orbits: No acute or significant abnormality. Other: Mastoid air cells are clear. ASPECTS ([place] Stroke Program Early CT Score) - Ganglionic level infarction (caudate, lentiform nuclei, internal capsule, insula, M1-M3 cortex): 7 - Supraganglionic infarction (M4-M6 cortex): 3 Total score (0-10 with 10 being normal): 10 IMPRESSION: There is no acute intracranial hemorrhage or evidence of acute infarction. ASPECT score is 10. Chronic microvascular ischemic changes. These results were called by telephone at the time of interpretation on 01/08/2021 at 10:10 am to provider [facility] , who verbally acknowledged these results. Electronically Signed   By: Macy Mis M.D.   On: 12/13/2020 10:11    EKG: Atrial fibrillation with RVR.  ASSESSMENT AND PLAN:  Lauren Bradshaw is a 70 year old female with a history of atrial fibrillation, hypertension, hyperlipidemia, vasculitis who presents to the emergency department with generalized weakness and was subsequently discovered to be in atrial fibrillation with RVR. She also has multisystem organ failure possibly related to sepsis.  # AF with RVR #  Multisystem organ failure, Severe sepsis The patient presents with fatigue and was subsequently discovered to have neutropenia, elevated creatinine, and altered mental status.  The etiology of this is unclear but certainly could be concerning for severe sepsis d/t C. difficile.  In the setting she has atrial fibrillation with RVR to the 150s with relative hypotension.  The atrial fibrillation is certainly a reflection of her severe disease state. Echo shows normal EF. -Recommend ongoing management of medical issues per primary inpatient team. -Continue IV amiodarone.  - Start metoprolol 12.5 mg q6. Titrate as tolerated.  -Would hold on anticoagulation at this time given her recent bleeding issues and need for transfusion. - If she develops worsened hypotension thought to be related to AF, follow ACLS guidelines.    Signed: Andrez Grime MD 01/12/2021, 5:08 PM

## 2021-01-12 NOTE — Progress Notes (Signed)
NAME:  Lauren Bradshaw, MRN:  [id number], DOB:  1950/11/22, LOS: 0 ADMISSION DATE:  01/02/2021 CONSULTATION DATE:  12/21/2020             REFERRING MD:  Dr. Kerman Passey CHIEF COMPLAINT:  Weakness, lethargy and abdominal pain     BRIEF SYNOPSIS:  Lauren Bradshaw is a 70 y/o F who presented to [facility] ED for weakness, lethargy, diarrhea, and abdominal pain after recent d/c from rehab for c.diff, Afib, PNA, and ANCA renal vasculitis. Patient placed in ICU care for hemodynamic instability secondary to hypovolemic septic shock d/t pancolitis with multiorgan failure with resp distress, renal failure, cardiac failure with afib, severe leukopenia with severe metabolic acidosis and encephalopathy.   History of Present Illness:  Lauren Bradshaw is a 70 year old female with a history of atrial fibrillation, c. Diff., HTN, HDL, and vasculitis who presents to [facility] ED with complaints of progressive weakness and lethargy. 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 Diff. (She was meant to FU with both renal and pulmonary specialists this week to determine further plan of care.)   She was discharged to her son Lauren Bradshaw) and daughter-in-law's Lauren Bradshaw) home able to pivot transfer and was doing quite well. She was reported to have formed stools upon discharge. However, over the weekend, she developed severe diarrhea, abdominal pain, and progressive weakness and lethargy. She had decreased oral intake of both food and water. Patient was unable to get out of bed, EMS was called. She was transferred to [facility] and not back to [facility] d/t positive stroke screen for slurred speech. Code stroke was activated.    Pertinent  Medical History  Atrial fibrillation  C. Diff HTN HDL ANCA renal  vasculitis  PNA w/ respiratory failure  Hypothyroidism    Significant Hospital Events: Including procedures, antibiotic start and stop dates in addition to other pertinent events   ED course: Code stroke activated, CTH obtained, but further testing deferred d/t hemodynamic instability. Patient was given amiodarone bolus for AFIB w/ RVR.    10/31>> The critical care team was consulted for hemodynamic instability. Patient was seen in ED bed, appears to be critically ill - complaining of abdominal pain, CT abdomen/pelvis ordered. Will be transferred to critical care for further workup and treatment.  11/1>> CT abdomen/pelvis confirms pancolitis. Surgery on board for possible colectomy with end iliostomy if worsening condition or increasing lactate. Continue treatment for sepsis and supportive care.  11/2>> Continue to trend lactate. Continue antibiotic treatment, pressure support as needed. No fever overnight. Increasing WBCs and neutrophils. Kidney function stable.  11/3>> Patient feeling better. Abdominal tenderness improved. Kidney function slightly worse. Continue abx treatment to avoid surgery. Increasing WBC count.    Micro Data:  10/31>> Blood culture pending (no growth 3 days) 10/31>> Stool PCR - positive for C.Diff    Antimicrobials:  10/31>> metronidazole, vancomycin, cefepime (1x dose)  11/1>> metronidazole, PO vancomycin    Interim History / Subjective:  Patient supporting own pressure, still in AFIB w/ RVR - per cardio, restart BB once pressure is consistently stable. No fever overnight. WBCs have improved.  Kidney function slightly worse. Patient feeling better today, abdominal tenderness has improved slightly. May be able to advance to clear liquid diet today.  Continue treatment for pancolitis &  sepsis with IV/PO abx to avoid total colectomy. Surgery following- continue conservative treatment as this time.      Objective   Blood pressure (!) 106/57, pulse (!) 124, temperature  (!) 97.5 F (36.4 C), temperature source Oral, resp. rate (!) 32, height '5\' 4"'  (1.626 m), weight 54.7 kg, SpO2 97 %.    >         Intake/Output Summary (Last 24 hours) at 12/15/2020 1642 Last data filed at 12/17/2020 1639    Gross per 24 hour  Intake 2500.67 ml  Output --  Net 2500.67 ml       Filed Weights    01/04/2021 1013  Weight: 54.7 kg      REVIEW OF SYSTEMS Review of Systems  Constitutional:  Positive for malaise/fatigue (Feels better today).  Respiratory:  Positive for shortness of breath. Negative for cough.   Cardiovascular:  Negative for chest pain.  Gastrointestinal:  Positive for abdominal pain. Negative for nausea and vomiting.  Neurological:  Negative for headaches.  Endo/Heme/Allergies:  Bruises/bleeds easily.    PHYSICAL EXAMINATION:   GENERAL: Ill appearing, pallor. A&Ox4.  EYES: Pupils equal, round, reactive to light. No scleral icterus. L eye exophthalmos.  MOUTH: Dry mucous membranes.  PULMONARY: Tachypnea, moderately increased WOB. Symmetric chest wall movement. Coarse lung sounds R upper lobe. Adequate airflow.  CARDIOVASCULAR: Atrial fibrillation w/ RVR. No MRG. GASTROINTESTINAL: Moderate tenderness to palpation and percussion.Slightly improved today. Moderately distended. Positive bowel sounds.  MUSCULOSKELETAL:  3+ pitting edema BLE. 2+ BUE.  NEUROLOGIC: A&Ox4. Generalized weakness, but no focal neurologic deficits.  SKIN: Diffuse petechia BUE, minimal petechia BLE.    Labs/imaging that I havepersonally reviewed  (right click and "Reselect all SmartList Selections" daily)  10/31>> CTH- There is no acute intracranial hemorrhage or evidence of acute infarction. ASPECT score is 10. 10/31 >> CXR- New airspace opacity in the right suprahilar lung likely due to pneumonia. Radiographic follow-up to resolution is recommended to exclude developing mass. 10/31 >> CT abdomen/pelvis - pancolitis. Left renal subscapular fluid collection, likely hematoma  d/t recent renal biopsy.  11/1>> CXR- persistent medial right upper lobe airspace consolidation concerning for pneumonia. Followup PA and lateral chest X-ray is recommended in 3-4 weeks following trial of antibiotic therapy to ensure resolution and exclude underlying malignancy. 11/1>> [place] BLE- No evidence of DVT within either lower extremity. 11/1>> Echo- EF 60-65%, mild LAE (overall normal heart function) 11/2>> KUB - nonobstructive bowel gas pattern. No free intraperitoneal air seen.  11/2>> MR Brain- tiny acute infarct in right frontal lobe. Moderate chronic microvascular ischemic disease.  11/2>> MR T-spine- unremarkable. Mod-sized bilateral pleural effusions.    Resolved Hospital Problem list       ASSESSMENT AND PLAN   SYNOPSIS: Keta is a 70 y/o F who presented to [facility] ED for weakness, lethargy, diarrhea, and abdominal pain after recent d/c from rehab for c.diff, Afib, PNA, and ANCA renal vasculitis. Patient placed in ICU care for hemodynamic instability secondary to hypovolemic septic shock d/t pancolitis with multiorgan failure with resp distress, renal failure, cardiac failure with afib, hypoglycemia, severe leukopenia with severe metabolic acidosis and encephalopathy.   #Hypovolemic shock   #Septic shock  #Metabolic acidosis with respiratory compensation (resolved) - Source: c. Diff pancolitis  - Fluid resuscitation as needed.   - Continue PO vanc & flagyl - Use vasopressors as needed for MAP>65 (not currently needing pressors)  - Trend CBC, procal, lactate, and VBGs; follow up cultures  - Start stress-dose Solu-Cortef  - Lactate: 6.4-->5.3-->4.2-->2.1-->1.7   #  Abdominal pain  #C.diff Pancolitis  - Severe abdominal tenderness to palpation and percussion (improving) - C. Diff PCR +   - CT abdomen/pelvis w/ contrast -pancolitis, renal hematoma, no abscess, no perforation - Per surgery - will try to treat pancolitis with aggressive abx to avoid surgery. Surgery would consist of  total colectomy with end ileostomy. Will continue to trend lactate, follow fever curve; if continues to elevate or be significantly elevated, will plan for urgent surgery. May be able to start clear liquid diet tomorrow  - Serial exams and AXRs, trend lactate q3 hrs - Zofran & fentanyl as needed for pain control   #Possible persistent PNA   - 11/1>> CXR: Persistent medial right upper lobe airspace consolidation concerning for pneumonia. Followup PA and lateral chest X-ray is recommended in 3-4 weeks following trial of antibiotic therapy to ensure resolution and exclude underlying malignancy.  - Will continue to trend WBC, follow fever curve, in case of under-treated PNA. Will continue with current abx treatment at this time and not add additional antimicrobials at risk of worsening c. Diff    #Electrolyte derangement  #Hyponatremia  #Hypokalemia  - Trend labs  - Pharmacy consultation, replace (phos, mag, K+) as needed  #Leukopenia (resolved) #Neutropenia  #Immunosuppression  #Acute anemia  - Per Oncology-  neutropenia likely multifactorial from medications and illness and not related to Rituxan and Cytoxan infusion from 2 month ago. No bone marrow biopsy required at this time. Hold Granix as WBC count is improving and patient is afebrile. Continue to monitor hemoglobin.  - Trend CBC (increasing WBCs and stable H&H)  CBC    Component Value Date/Time   WBC 5.1 01/12/2021 0330   RBC 3.39 (L) 01/12/2021 0330   HGB 9.5 (L) 01/12/2021 0330   HCT 28.3 (L) 01/12/2021 0330   PLT 123 (L) 01/12/2021 0330   MCV 83.5 01/12/2021 0330   MCH 28.0 01/12/2021 0330   MCHC 33.6 01/12/2021 0330   RDW 20.9 (H) 01/12/2021 0330   LYMPHSABS 0.4 (L) 01/11/2021 0450   MONOABS 0.3 01/11/2021 0450   EOSABS 0.2 01/11/2021 0450   BASOSABS 0.1 01/11/2021 0450     #Afib w/ RVR  - No current anticoagulation, was on Eliquis, but was discontinued several weeks ago d/t downtrending H&H.  - Amiodarone drip   68m/hr for rate control (still consistently in 130s-140s)  - Per cardiology - will start BB when BP consistently stable - Tachycardia likely driven by sepsis   #Acute kidney injury/Renal Failure -continue Foley Catheter-assess need -Avoid nephrotoxic agents -Follow BMP & urine output; continue to monitor -Ensure adequate renal perfusion, optimize oxygenation -Renal dose medications   Intake/Output Summary (Last 24 hours) at 12/27/2020 1657 Last data filed at 12/26/2020 1639    Gross per 24 hour  Intake 2500.67 ml  Output --  Net 2500.67 ml   BMP Latest Ref Rng & Units 01/12/2021 01/11/2021 01/10/2021  Glucose 70 - 99 mg/dL 112(H) 78 -  BUN 8 - 23 mg/dL 30(H) 28(H) -  Creatinine 0.44 - 1.00 mg/dL 1.82(H) 1.61(H) -  Sodium 135 - 145 mmol/L 128(L) 129(L) -  Potassium 3.5 - 5.1 mmol/L 3.7 3.8 3.2(L)  Chloride 98 - 111 mmol/L 93(L) 94(L) -  CO2 22 - 32 mmol/L 24 26 -  Calcium 8.9 - 10.3 mg/dL 7.1(L) 6.7(L) -      #Hypothyroidism  - Levothyroxine 75 mcg once a day   Best practice (right click and "Reselect all SmartList Selections" daily)  Diet: NPO Pain/Anxiety/Delirium protocol (  if indicated): No VAP protocol (if indicated): Not indicated DVT prophylaxis: SCDs GI prophylaxis: PPI Glucose control:  SSI No Central venous access:  N/A Arterial line:  N/A Foley:  N/A Mobility:  bed rest  Code Status:  FULL Disposition: ICU     DVT/GI PRX  assessed I Assessed the need for Labs I Assessed the need for Foley I Assessed the need for Central Venous Line Family Discussion when available I Assessed the need for Mobilization I made an Assessment of medications to be adjusted accordingly Safety Risk assessment completed  CASE DISCUSSED IN MULTIDISCIPLINARY ROUNDS WITH ICU TEAM     Critical Care Time devoted to patient care services described in this note is 60 minutes.  Critical care was necessary to treat /prevent imminent and life-threatening deterioration. Overall,  patient is critically ill, prognosis is guarded.     Corrin Parker, M.D.  [facility]  Medical Director [place] Director [facility] Cardio-Pulmonary Department

## 2021-01-12 NOTE — Progress Notes (Signed)
Pt looks and feels better today.   Continues to receive Amio gtt.   Foley in place, put out 175 ml.   PRN Fent given x3 to manage pain. Pain seems better managed today.   Ice chips and sips given w/ PO Vanc.

## 2021-01-13 ENCOUNTER — Inpatient Hospital Stay: Payer: Medicare HMO

## 2021-01-13 ENCOUNTER — Other Ambulatory Visit: Payer: Self-pay

## 2021-01-13 DIAGNOSIS — A0472 Enterocolitis due to Clostridium difficile, not specified as recurrent: Secondary | ICD-10-CM | POA: Diagnosis not present

## 2021-01-13 DIAGNOSIS — K51018 Ulcerative (chronic) pancolitis with other complication: Secondary | ICD-10-CM | POA: Diagnosis not present

## 2021-01-13 DIAGNOSIS — I4891 Unspecified atrial fibrillation: Secondary | ICD-10-CM | POA: Diagnosis not present

## 2021-01-13 DIAGNOSIS — R197 Diarrhea, unspecified: Secondary | ICD-10-CM | POA: Diagnosis not present

## 2021-01-13 LAB — CBC WITH DIFFERENTIAL/PLATELET
Abs Immature Granulocytes: 1.16 10*3/uL — ABNORMAL HIGH (ref 0.00–0.07)
Basophils Absolute: 0 10*3/uL (ref 0.0–0.1)
Basophils Relative: 0 %
Eosinophils Absolute: 0 10*3/uL (ref 0.0–0.5)
Eosinophils Relative: 0 %
HCT: 29.1 % — ABNORMAL LOW (ref 36.0–46.0)
Hemoglobin: 9.9 g/dL — ABNORMAL LOW (ref 12.0–15.0)
Immature Granulocytes: 15 %
Lymphocytes Relative: 4 %
Lymphs Abs: 0.3 10*3/uL — ABNORMAL LOW (ref 0.7–4.0)
MCH: 28 pg (ref 26.0–34.0)
MCHC: 34 g/dL (ref 30.0–36.0)
MCV: 82.2 fL (ref 80.0–100.0)
Monocytes Absolute: 0.3 10*3/uL (ref 0.1–1.0)
Monocytes Relative: 4 %
Neutro Abs: 5.9 10*3/uL (ref 1.7–7.7)
Neutrophils Relative %: 77 %
Platelets: 121 10*3/uL — ABNORMAL LOW (ref 150–400)
RBC: 3.54 MIL/uL — ABNORMAL LOW (ref 3.87–5.11)
RDW: 21.1 % — ABNORMAL HIGH (ref 11.5–15.5)
WBC: 7.8 10*3/uL (ref 4.0–10.5)
nRBC: 0 % (ref 0.0–0.2)

## 2021-01-13 LAB — LACTATE DEHYDROGENASE, PLEURAL OR PERITONEAL FLUID: LD, Fluid: 99 U/L — ABNORMAL HIGH (ref 3–23)

## 2021-01-13 LAB — GLUCOSE, CAPILLARY
Glucose-Capillary: 118 mg/dL — ABNORMAL HIGH (ref 70–99)
Glucose-Capillary: 88 mg/dL (ref 70–99)
Glucose-Capillary: 88 mg/dL (ref 70–99)
Glucose-Capillary: 92 mg/dL (ref 70–99)
Glucose-Capillary: 98 mg/dL (ref 70–99)
Glucose-Capillary: 99 mg/dL (ref 70–99)

## 2021-01-13 LAB — HEPATIC FUNCTION PANEL
ALT: 9 U/L (ref 0–44)
AST: 11 U/L — ABNORMAL LOW (ref 15–41)
Albumin: 1.7 g/dL — ABNORMAL LOW (ref 3.5–5.0)
Alkaline Phosphatase: 409 U/L — ABNORMAL HIGH (ref 38–126)
Bilirubin, Direct: <0.1 mg/dL (ref 0.0–0.2)
Total Bilirubin: 0.9 mg/dL (ref 0.3–1.2)
Total Protein: 4.2 g/dL — ABNORMAL LOW (ref 6.5–8.1)

## 2021-01-13 LAB — PROTEIN, PLEURAL OR PERITONEAL FLUID: Total protein, fluid: <3 g/dL

## 2021-01-13 LAB — BASIC METABOLIC PANEL
Anion gap: 15 (ref 5–15)
BUN: 34 mg/dL — ABNORMAL HIGH (ref 8–23)
CO2: 21 mmol/L — ABNORMAL LOW (ref 22–32)
Calcium: 7.7 mg/dL — ABNORMAL LOW (ref 8.9–10.3)
Chloride: 93 mmol/L — ABNORMAL LOW (ref 98–111)
Creatinine, Ser: 1.88 mg/dL — ABNORMAL HIGH (ref 0.44–1.00)
GFR, Estimated: 29 mL/min — ABNORMAL LOW (ref >60)
Glucose, Bld: 115 mg/dL — ABNORMAL HIGH (ref 70–99)
Potassium: 3.3 mmol/L — ABNORMAL LOW (ref 3.5–5.1)
Sodium: 129 mmol/L — ABNORMAL LOW (ref 135–145)

## 2021-01-13 LAB — BODY FLUID CELL COUNT WITH DIFFERENTIAL
Eos, Fluid: 0 %
Lymphs, Fluid: 63 %
Monocyte-Macrophage-Serous Fluid: 12 %
Neutrophil Count, Fluid: 25 %
Total Nucleated Cell Count, Fluid: 282 cu mm

## 2021-01-13 LAB — GLUCOSE, PLEURAL OR PERITONEAL FLUID: Glucose, Fluid: 98 mg/dL

## 2021-01-13 LAB — POTASSIUM: Potassium: 3.7 mmol/L (ref 3.5–5.1)

## 2021-01-13 LAB — PROTEIN / CREATININE RATIO, URINE
Creatinine, Urine: 39 mg/dL
Protein Creatinine Ratio: 1.44 mg/mg{Cre} — ABNORMAL HIGH (ref 0.00–0.15)
Total Protein, Urine: 56 mg/dL

## 2021-01-13 LAB — PROCALCITONIN: Procalcitonin: 19.72 ng/mL

## 2021-01-13 LAB — MAGNESIUM: Magnesium: 2.5 mg/dL — ABNORMAL HIGH (ref 1.7–2.4)

## 2021-01-13 LAB — BRAIN NATRIURETIC PEPTIDE: B Natriuretic Peptide: 258.3 pg/mL — ABNORMAL HIGH (ref 0.0–100.0)

## 2021-01-13 LAB — PHOSPHORUS: Phosphorus: 3.9 mg/dL (ref 2.5–4.6)

## 2021-01-13 LAB — LACTIC ACID, PLASMA: Lactic Acid, Venous: 1.2 mmol/L (ref 0.5–1.9)

## 2021-01-13 IMAGING — DX DG ABDOMEN 1V
2 series · 2 of 2 positions shown · non-contrast
Comparison: [DATE].

CLINICAL DATA: Weakness.

EXAM:
ABDOMEN - 1 VIEW

[abdomen supine (1 of 2)]
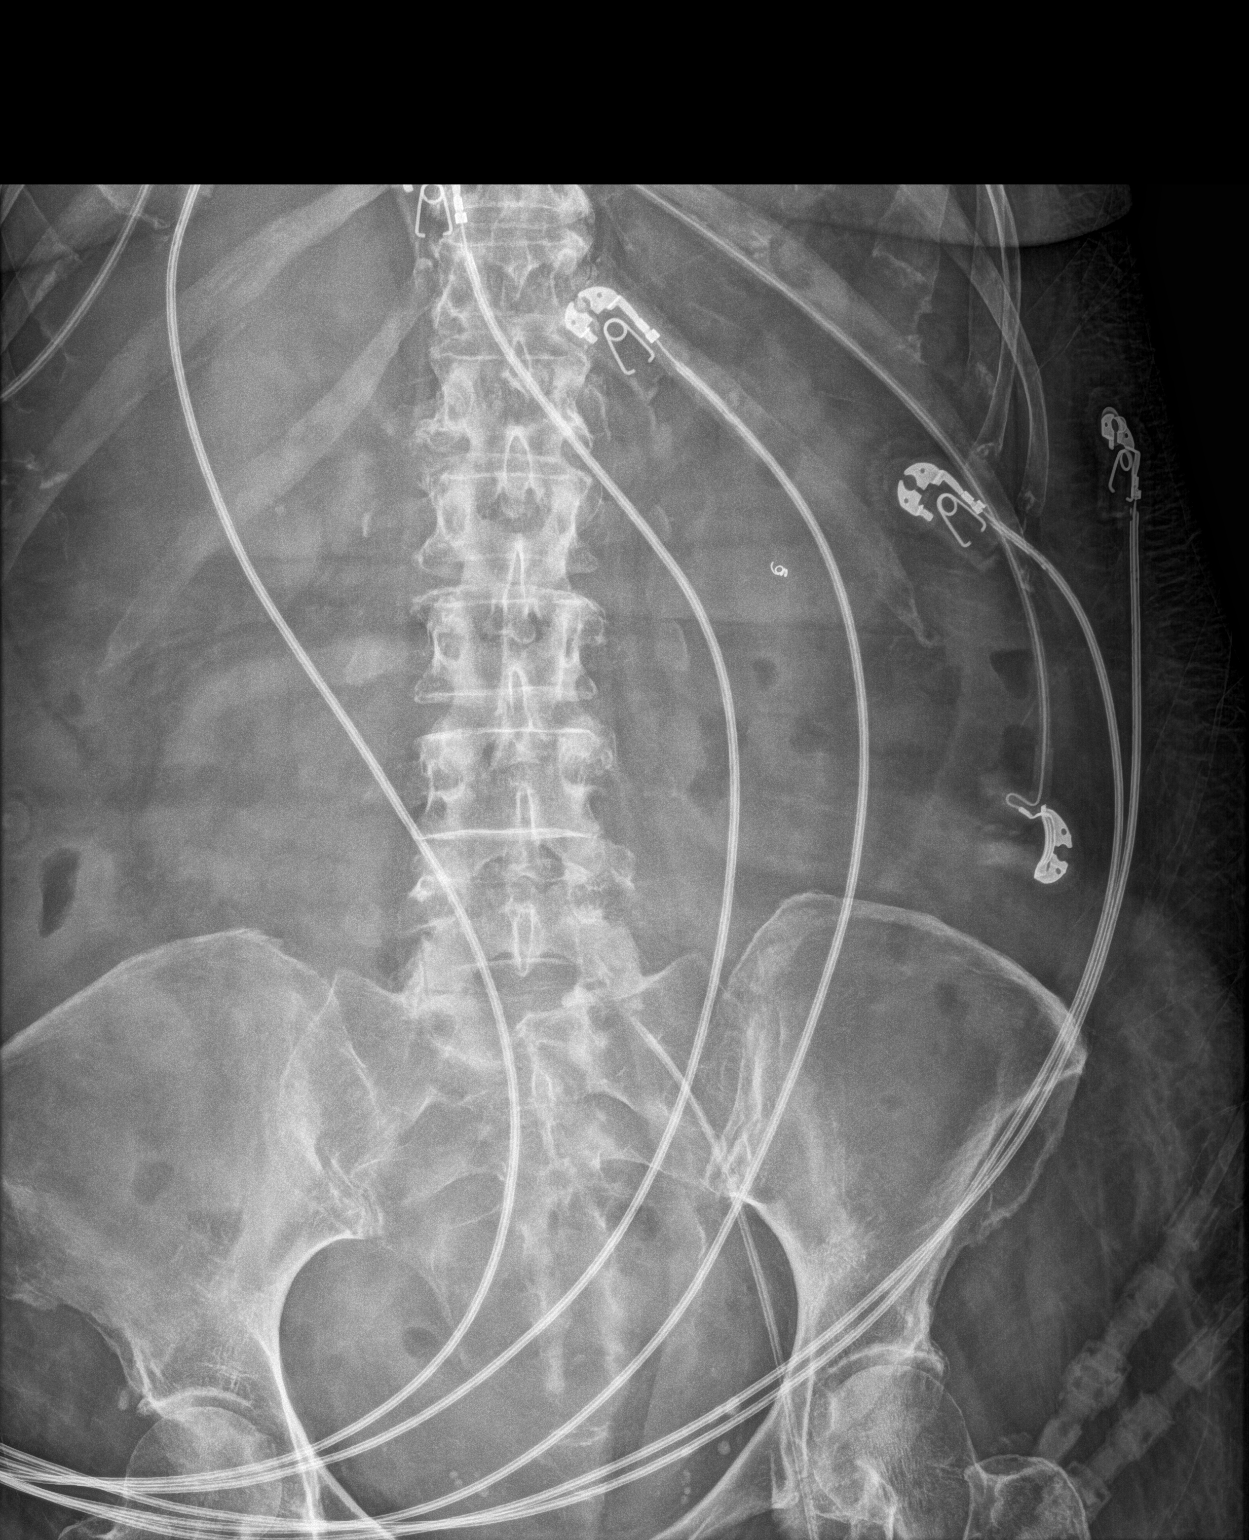

[abdomen supine (2 of 2)]
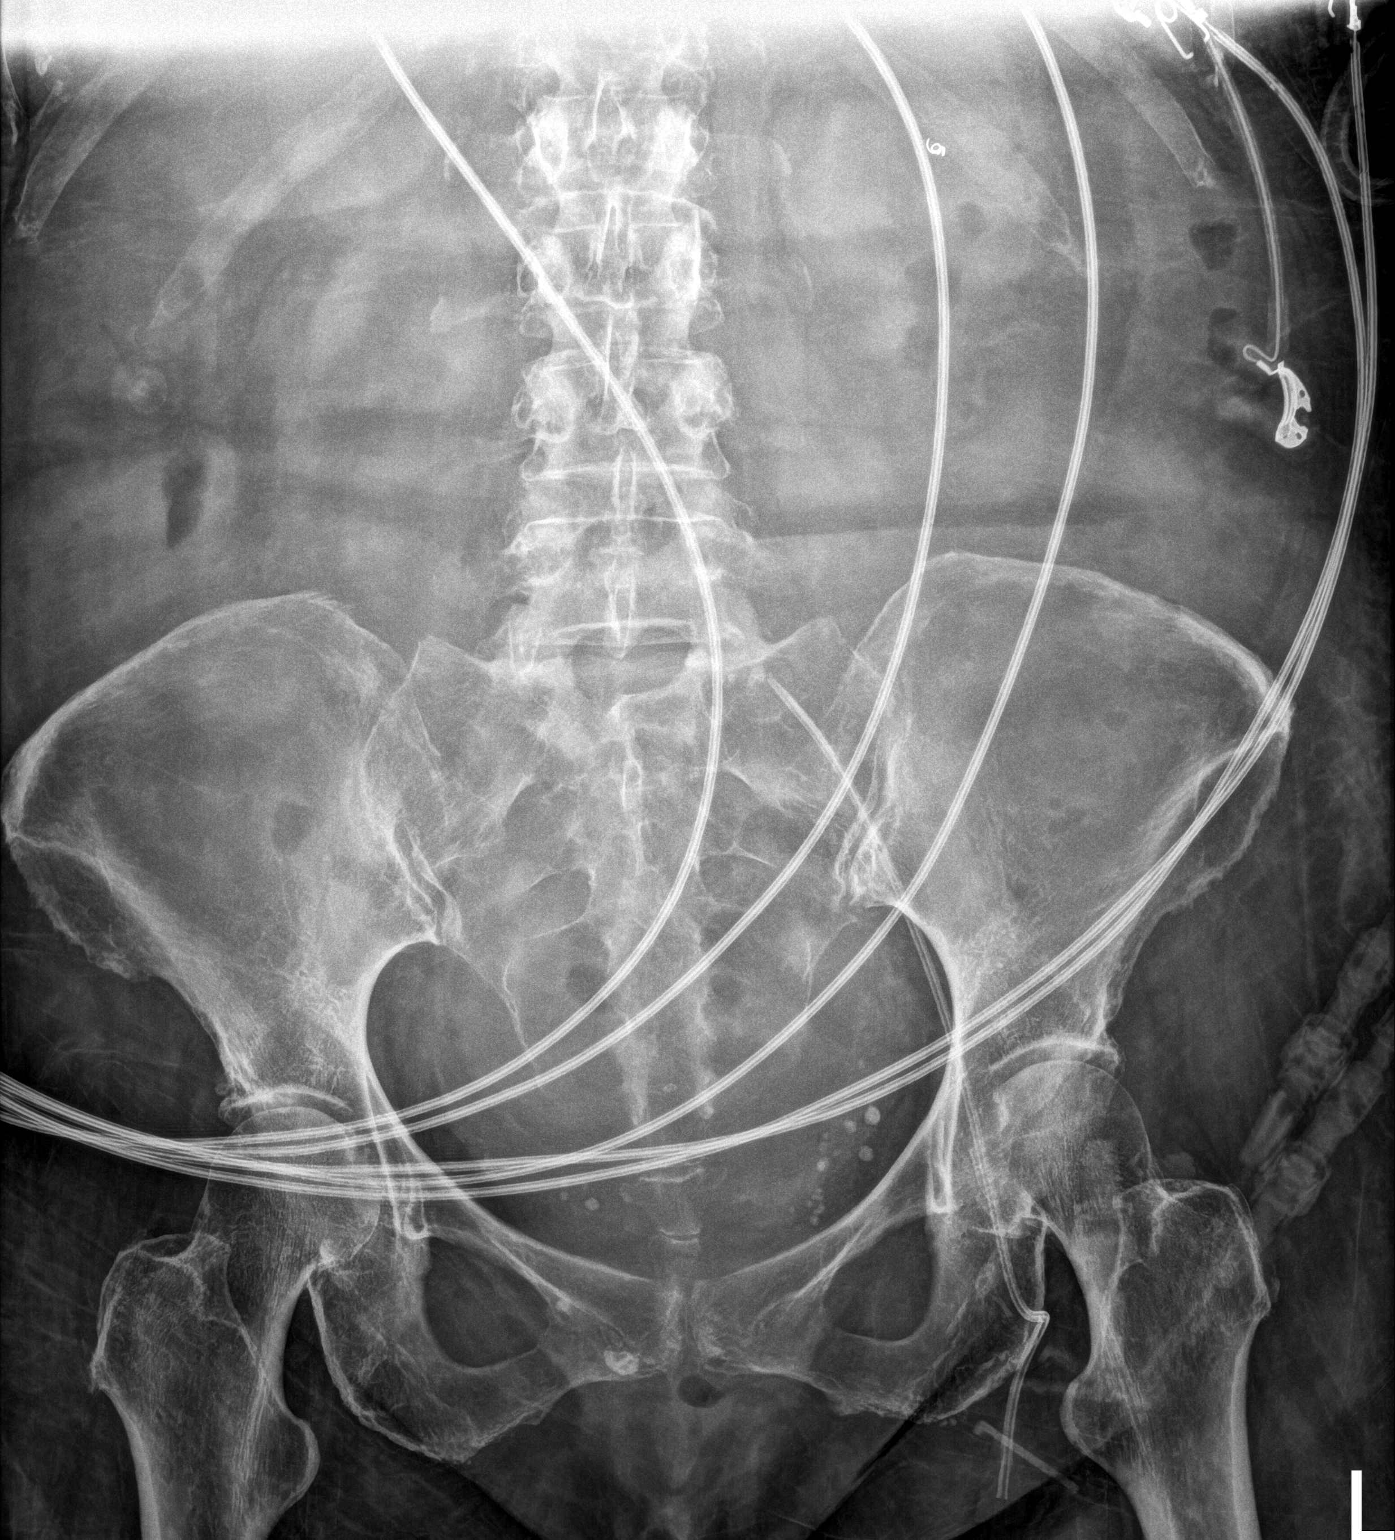

[2 of 2 positions shown; findings below may reference images not displayed]

FINDINGS: The bowel gas pattern is normal. Phleboliths are noted in the
pelvis. Stable position of left femoral catheter.
IMPRESSION: No abnormal bowel dilatation.

## 2021-01-13 IMAGING — US US THORACENTESIS ASP PLEURAL SPACE W/IMG GUIDE
1 series · 2 of 2 positions shown · non-contrast
Comparison: none

INDICATION: 70-year-old with pleural effusions.

[Series 1: us thoracentesis asp pleural space w/img guide · 2 of 2 slices shown]
[im 1/2]
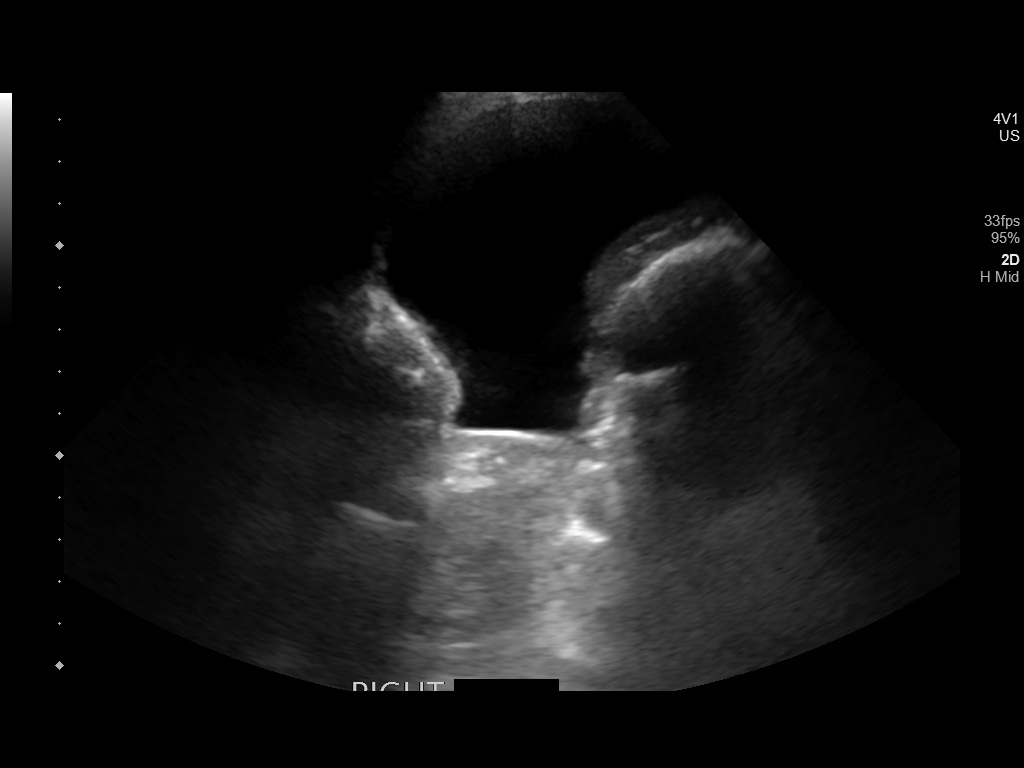
[im 2/2]
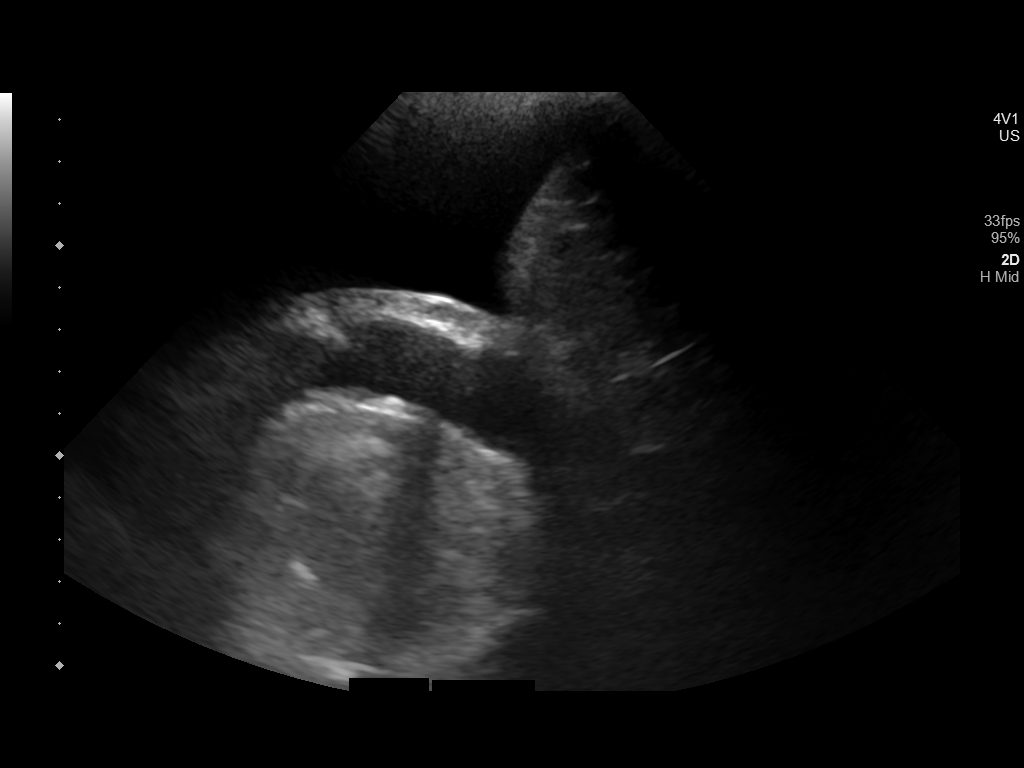

[2 of 2 positions shown; findings below may reference images not displayed]

EXAM:
ULTRASOUND GUIDED LEFT THORACENTESIS

MEDICATIONS:
None.

COMPLICATIONS:
None immediate.

PROCEDURE:
An ultrasound guided thoracentesis was thoroughly discussed with the
patient and questions answered. The benefits, risks, alternatives
and complications were also discussed. The patient understands and
wishes to proceed with the procedure. Written consent was obtained.

Ultrasound was performed to localize and mark an adequate pocket of
fluid in the left chest. The area was then prepped and draped in the
normal sterile fashion. 1% Lidocaine was used for local anesthesia.
Under ultrasound guidance a 6 Fr Safe-T-Centesis catheter was
introduced. Thoracentesis was performed. The catheter was removed
and a dressing applied.
FINDINGS: Ultrasound performed prior to the procedure demonstrated bilateral
pleural effusions.

A total of approximately 450 mL of yellow fluid was removed. Samples
were sent to the laboratory as requested by the clinical team.
IMPRESSION: Successful ultrasound guided left thoracentesis yielding 450 mL of
pleural fluid.

## 2021-01-13 IMAGING — US US THORACENTESIS ASP PLEURAL SPACE W/IMG GUIDE
1 series · 2 of 2 positions shown · non-contrast
Comparison: none

INDICATION: 70-year-old with pleural effusions.

[Series 1: us thoracentesis asp pleural space w/img guide · 0.20mm/px · 2 of 2 slices shown]
[im 1/2]
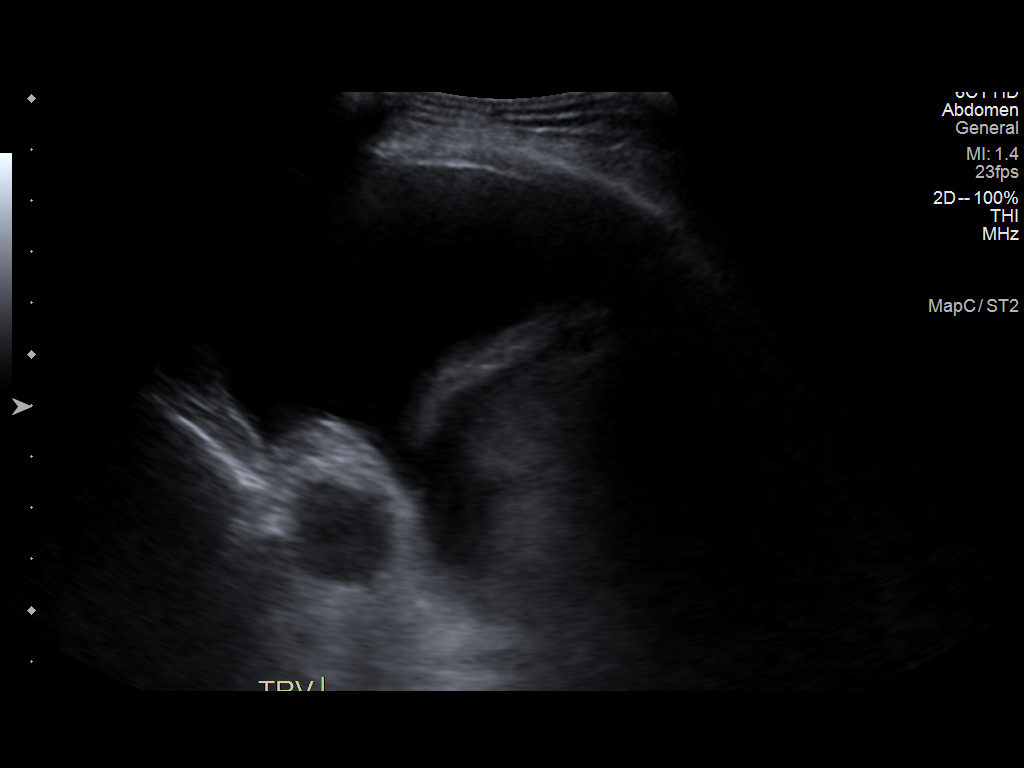
[im 2/2]
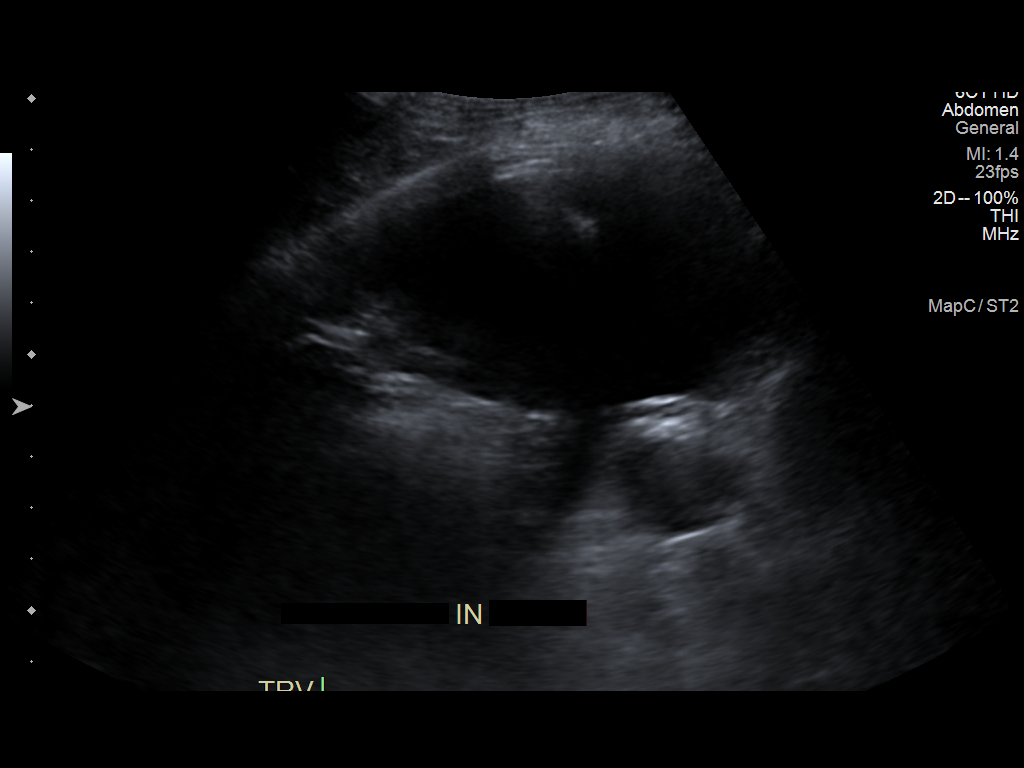

[2 of 2 positions shown; findings below may reference images not displayed]

EXAM:
ULTRASOUND GUIDED LEFT THORACENTESIS

MEDICATIONS:
None.

COMPLICATIONS:
None immediate.

PROCEDURE:
An ultrasound guided thoracentesis was thoroughly discussed with the
patient and questions answered. The benefits, risks, alternatives
and complications were also discussed. The patient understands and
wishes to proceed with the procedure. Written consent was obtained.

Ultrasound was performed to localize and mark an adequate pocket of
fluid in the left chest. The area was then prepped and draped in the
normal sterile fashion. 1% Lidocaine was used for local anesthesia.
Under ultrasound guidance a 6 Fr Safe-T-Centesis catheter was
introduced. Thoracentesis was performed. The catheter was removed
and a dressing applied.
FINDINGS: Ultrasound performed prior to the procedure demonstrated bilateral
pleural effusions.

A total of approximately 450 mL of yellow fluid was removed. Samples
were sent to the laboratory as requested by the clinical team.
IMPRESSION: Successful ultrasound guided left thoracentesis yielding 450 mL of
pleural fluid.

## 2021-01-13 MED ORDER — DILTIAZEM HCL-DEXTROSE 125-5 MG/125ML-% IV SOLN (PREMIX)
5.0000 mg/h | INTRAVENOUS | Status: DC
  Administered 2021-01-13: 5 mg/h via INTRAVENOUS
  Filled 2021-01-13: qty 125

## 2021-01-13 MED ORDER — POTASSIUM CHLORIDE CRYS ER 20 MEQ PO TBCR
40.0000 meq | EXTENDED_RELEASE_TABLET | Freq: Once | ORAL | Status: AC
  Administered 2021-01-13: 40 meq via ORAL
  Filled 2021-01-13: qty 2

## 2021-01-13 MED ORDER — IPRATROPIUM-ALBUTEROL 0.5-2.5 (3) MG/3ML IN SOLN
3.0000 mL | RESPIRATORY_TRACT | Status: DC
  Administered 2021-01-13 (×4): 3 mL via RESPIRATORY_TRACT
  Filled 2021-01-13 (×4): qty 3

## 2021-01-13 MED ORDER — LEVALBUTEROL HCL 0.63 MG/3ML IN NEBU: 0.6300 mg | INHALATION_SOLUTION | Freq: Four times a day (QID) | RESPIRATORY_TRACT | Status: DC | PRN

## 2021-01-13 MED ORDER — POTASSIUM CHLORIDE 20 MEQ PO PACK
40.0000 meq | PACK | Freq: Once | ORAL | Status: DC
  Filled 2021-01-13: qty 2

## 2021-01-13 MED ORDER — FUROSEMIDE 10 MG/ML IJ SOLN
80.0000 mg | Freq: Once | INTRAMUSCULAR | Status: AC
  Administered 2021-01-13: 80 mg via INTRAVENOUS
  Filled 2021-01-13: qty 8

## 2021-01-13 MED ORDER — BUDESONIDE 0.5 MG/2ML IN SUSP
0.5000 mg | Freq: Two times a day (BID) | RESPIRATORY_TRACT | Status: DC
  Administered 2021-01-13 – 2021-01-14 (×4): 0.5 mg via RESPIRATORY_TRACT
  Filled 2021-01-13 (×5): qty 2

## 2021-01-13 MED ORDER — DIGOXIN 250 MCG PO TABS
0.2500 mg | ORAL_TABLET | Freq: Once | ORAL | Status: AC
  Administered 2021-01-14: 0.25 mg via ORAL
  Filled 2021-01-13: qty 1

## 2021-01-13 MED ORDER — DIGOXIN 0.25 MG/ML IJ SOLN
0.5000 mg | Freq: Every day | INTRAMUSCULAR | Status: AC
  Administered 2021-01-13: 0.5 mg via INTRAVENOUS
  Filled 2021-01-13: qty 2

## 2021-01-13 MED ORDER — METOPROLOL TARTRATE 25 MG PO TABS
12.5000 mg | ORAL_TABLET | Freq: Four times a day (QID) | ORAL | Status: DC
  Administered 2021-01-13: 12.5 mg via ORAL
  Filled 2021-01-13: qty 1

## 2021-01-13 MED ORDER — METOPROLOL TARTRATE 25 MG PO TABS: 12.5000 mg | ORAL_TABLET | Freq: Four times a day (QID) | ORAL | Status: DC

## 2021-01-13 MED ORDER — ESMOLOL HCL-SODIUM CHLORIDE 2000 MG/100ML IV SOLN
25.0000 ug/kg/min | INTRAVENOUS | Status: DC
  Administered 2021-01-13: 25 ug/kg/min via INTRAVENOUS
  Filled 2021-01-13: qty 100

## 2021-01-13 NOTE — Progress Notes (Signed)
Refractory Atrial Fibrillation with Rapid Ventricular Response Patient has been in RVR, HR currently sustaining 130's-150's. Chart reviewed and Amiodarone discontinued d/t concerns for lung toxicity. HR control with BB limited due to hypotension. Patient placed on Esmolol drip during day shift 01/13/21 which was then stopped s/t hypotension. Started low dose diltiazem drip for HR control, also limited due to marginal BP's. Discussed with Dr. Juliann Pares, on call for cardiology, to discuss the option of Digoxin.   Cardiology recommendations: - 0.5 mg digoxin loading dose, followed by 0.25 mg 5 hours later - follow up on daily dosing as renal function allows - follow electrolytes closely, K+ stable after replacement but pt has been hypokalemic this admit - f/u digoxin level 1700 11/5 - discontinue Cardizem infusion - Cardiology consulted, appreciate input - Continuous cardiac monitoring     Cheryll Cockayne Rust-Chester, AGACNP-BC Acute Care Nurse Practitioner [facility]   [phone] / [phone] Please see Amion for pager details.

## 2021-01-13 NOTE — Progress Notes (Signed)
OT Cancellation Note  Patient Details Name: Lauren Bradshaw MRN: [id number] DOB: 1950-12-12   Cancelled Treatment:    Reason Eval/Treat Not Completed: Patient not medically ready. Per chart review, pt's MEWS score 7 and HR in 140s. Will defer evaluation to later date/time once medically ready.  Kathie Dike, M.S. OTR/L  01/13/21, 12:35 PM  ascom [phone]

## 2021-01-13 NOTE — Consult Note (Signed)
[facility] Consult Note:    Date of Admission:  12/14/2020           Reason for Consult:     Referring Provider: Flora Lipps, MD Primary Care Provider: Kateri Mc, MD   History of Presenting Illness:  Lauren Bradshaw is a 70 y.o. female  According to admit H& P and notes, patient was recently diagnosed with Vasculitis. Care everywhere records are not available.  She was recently d/c from [employer] from home for e/m of werakness and posible stroke Has been in rehab and hospitalized off and on since august.Vasulitis treated with rituximab. Patient feels generalized weakness.  Her husband is at bedside providing the information. Patient states her appetite is very poor.  She was not able to eat much at home.  She does not have lower extremity edema.  No nausea, vomiting or diarrhea.  More recently she has developed shortness of breath and is now requiring high flow oxygen.  No chest pain.  No acute joint pains.  No hematuria.  No problems with voiding. Her husband reports that she was able to stand and pivot when she got home last weekend but since then she has been progressively getting worse.   Review of Systems: ROS Limited due to acute illness.  See HPI  Past Medical History:  Diagnosis Date   Atrial fibrillation (HCC)    C. difficile diarrhea    GERD (gastroesophageal reflux disease)    Hyperlipidemia    Hypertension    Insomnia    Seizure ([place])    Thyroid disease    Vasculitis ([place])     Social History   Tobacco Use   Smoking status: Never   Smokeless tobacco: Never  Substance Use Topics   Alcohol use: Not Currently    No family history on file.   OBJECTIVE: Blood pressure 110/73, pulse (!) 140, temperature (!) 96.6 F (35.9 C), temperature source Axillary, resp. rate (!) 33, height 5\' 4"  (1.626 m), weight 54.7 kg, SpO2 (!) 87 %.  Physical Exam Physical Exam: General:  No acute distress, laying in the bed, ill-appearing   HEENT  anicteric, moist oral mucous membrane  Pulm/lungs Mild basilar crackles, high flow nasal cannula  CVS/Heart  irregular rhythm, tachycardic, A. fib  Abdomen:   Soft, nontender  Extremities:  No peripheral edema  Neurologic:  Alert, oriented, able to follow commands  Skin:  No acute rashes  Foley in place with dark yellow urine  Lab Results Lab Results  Component Value Date   WBC 7.8 01/13/2021   HGB 9.9 (L) 01/13/2021   HCT 29.1 (L) 01/13/2021   MCV 82.2 01/13/2021   PLT 121 (L) 01/13/2021    Lab Results  Component Value Date   CREATININE 1.88 (H) 01/13/2021   BUN 34 (H) 01/13/2021   NA 129 (L) 01/13/2021   K 3.3 (L) 01/13/2021   CL 93 (L) 01/13/2021   CO2 21 (L) 01/13/2021    Lab Results  Component Value Date   ALT 9 01/13/2021   AST 11 (L) 01/13/2021   ALKPHOS 409 (H) 01/13/2021   BILITOT 0.9 01/13/2021     Microbiology: Recent Results (from the past 240 hour(s))  Resp Panel by RT-PCR (Flu A&B, Covid) Nasopharyngeal Swab     Status: None   Collection Time: 12/30/2020 10:14 AM   Specimen: Nasopharyngeal Swab; Nasopharyngeal(NP) swabs in vial transport medium  Result Value Ref Range Status   SARS Coronavirus 2 by RT PCR NEGATIVE  NEGATIVE Final    Comment: (NOTE) SARS-CoV-2 target nucleic acids are NOT DETECTED.  The SARS-CoV-2 RNA is generally detectable in upper respiratory specimens during the acute phase of infection. The lowest concentration of SARS-CoV-2 viral copies this assay can detect is 138 copies/mL. A negative result does not preclude SARS-Cov-2 infection and should not be used as the sole basis for treatment or other patient management decisions. A negative result may occur with  improper specimen collection/handling, submission of specimen other than nasopharyngeal swab, presence of viral mutation(s) within the areas targeted by this assay, and inadequate number of viral copies(<138 copies/mL). A negative result must be combined with clinical  observations, patient history, and epidemiological information. The expected result is Negative.  Fact Sheet for Patients:  EntrepreneurPulse.com.au  Fact Sheet for Healthcare Providers:  IncredibleEmployment.be  This test is no t yet approved or cleared by the [place] FDA and  has been authorized for detection and/or diagnosis of SARS-CoV-2 by FDA under an Emergency Use Authorization (EUA). This EUA will remain  in effect (meaning this test can be used) for the duration of the COVID-19 declaration under Section 564(b)(1) of the Act, 21 U.S.C.section 360bbb-3(b)(1), unless the authorization is terminated  or revoked sooner.       Influenza A by PCR NEGATIVE NEGATIVE Final   Influenza B by PCR NEGATIVE NEGATIVE Final    Comment: (NOTE) The Xpert Xpress SARS-CoV-2/FLU/RSV plus assay is intended as an aid in the diagnosis of influenza from Nasopharyngeal swab specimens and should not be used as a sole basis for treatment. Nasal washings and aspirates are unacceptable for Xpert Xpress SARS-CoV-2/FLU/RSV testing.  Fact Sheet for Patients: EntrepreneurPulse.com.au  Fact Sheet for Healthcare Providers: IncredibleEmployment.be  This test is not yet approved or cleared by the [place] FDA and has been authorized for detection and/or diagnosis of SARS-CoV-2 by FDA under an Emergency Use Authorization (EUA). This EUA will remain in effect (meaning this test can be used) for the duration of the COVID-19 declaration under Section 564(b)(1) of the Act, 21 U.S.C. section 360bbb-3(b)(1), unless the authorization is terminated or revoked.  Performed at [facility], [place], [place] [address]   C Difficile Quick Screen w PCR reflex     Status: Abnormal   Collection Time: 01/01/2021 10:14 AM   Specimen: STOOL  Result Value Ref Range Status   C Diff antigen POSITIVE (A) NEGATIVE  Final   C Diff toxin POSITIVE (A) NEGATIVE Final   C Diff interpretation Toxin producing C. difficile detected.  Final    Comment: POSITIVE CRITICAL RESULT CALLED TO, READ BACK BY AND VERIFIED WITH: OLIVIA PRILLIEM 12/18/2020 1715 MU Performed at [facility], [address] [place], [place] [address]   Gastrointestinal Panel by PCR , Stool     Status: None   Collection Time: 01/07/2021 10:14 AM   Specimen: STOOL  Result Value Ref Range Status   Campylobacter species NOT DETECTED NOT DETECTED Final   Plesimonas shigelloides NOT DETECTED NOT DETECTED Final   Salmonella species NOT DETECTED NOT DETECTED Final   Yersinia enterocolitica NOT DETECTED NOT DETECTED Final   Vibrio species NOT DETECTED NOT DETECTED Final   Vibrio cholerae NOT DETECTED NOT DETECTED Final   Enteroaggregative E coli (EAEC) NOT DETECTED NOT DETECTED Final   Enteropathogenic E coli (EPEC) NOT DETECTED NOT DETECTED Final   Enterotoxigenic E coli (ETEC) NOT DETECTED NOT DETECTED Final   Shiga like toxin producing E coli (STEC) NOT DETECTED NOT DETECTED  Final   Shigella/Enteroinvasive E coli (EIEC) NOT DETECTED NOT DETECTED Final   Cryptosporidium NOT DETECTED NOT DETECTED Final   Cyclospora cayetanensis NOT DETECTED NOT DETECTED Final   Entamoeba histolytica NOT DETECTED NOT DETECTED Final   Giardia lamblia NOT DETECTED NOT DETECTED Final   Adenovirus F40/41 NOT DETECTED NOT DETECTED Final   Astrovirus NOT DETECTED NOT DETECTED Final   Norovirus GI/GII NOT DETECTED NOT DETECTED Final   Rotavirus A NOT DETECTED NOT DETECTED Final   Sapovirus (I, II, IV, and V) NOT DETECTED NOT DETECTED Final    Comment: Performed at [facility], [address] [place], [place] [address]  Blood Culture (routine x 2)     Status: None (Preliminary result)   Collection Time: 01/01/2021  1:49 PM   Specimen: BLOOD  Result Value Ref Range Status   Specimen Description BLOOD LEFT ANTECUBITAL  Final   Special Requests    Final    BOTTLES DRAWN AEROBIC ONLY Blood Culture results may not be optimal due to an inadequate volume of blood received in culture bottles   Culture   Final    NO GROWTH 4 DAYS Performed at [facility], [address] [place], [place] [address]    Report Status PENDING  Incomplete  Blood Culture (routine x 2)     Status: None (Preliminary result)   Collection Time: 12/24/2020  2:16 PM   Specimen: BLOOD  Result Value Ref Range Status   Specimen Description BLOOD BLOOD RIGHT ARM  Final   Special Requests   Final    BOTTLES DRAWN AEROBIC AND ANAEROBIC Blood Culture adequate volume   Culture   Final    NO GROWTH 4 DAYS Performed at [facility], [address] [place], [place] [address]    Report Status PENDING  Incomplete  MRSA Next Gen by PCR, Nasal     Status: None   Collection Time: 12/26/2020  6:05 PM   Specimen: Nasal Mucosa; Nasal Swab  Result Value Ref Range Status   MRSA by PCR Next Gen NOT DETECTED NOT DETECTED Final    Comment: (NOTE) The GeneXpert MRSA Assay (FDA approved for NASAL specimens only), is one component of a comprehensive MRSA colonization surveillance program. It is not intended to diagnose MRSA infection nor to guide or monitor treatment for MRSA infections. Test performance is not FDA approved in patients less than 8 years old. Performed at [facility], [address] [place], [place] [address]   Urine Culture     Status: None   Collection Time: 01/10/21  8:07 AM   Specimen: In/Out Cath Urine  Result Value Ref Range Status   Specimen Description   Final    IN/OUT CATH URINE Performed at [facility], [address], [place], [place] [address]    Special Requests   Final    NONE Performed at [facility], [address], [place], [place] [address]    Culture   Final    NO GROWTH Performed at [facility], [place] [address], [place], [place] [address]    Report Status 01/11/2021 FINAL   Final    Medications: Scheduled Meds:  budesonide (PULMICORT) nebulizer solution  0.5 mg Nebulization BID   Chlorhexidine Gluconate Cloth  6 each Topical Daily   dextrose  1 ampule Intravenous Once   heparin injection (subcutaneous)  5,000 Units Subcutaneous Q8H   ipratropium-albuterol  3 mL Nebulization Q4H   levothyroxine  75 mcg Oral Q0600  mouth rinse  15 mL Mouth Rinse BID   metoprolol tartrate  12.5 mg Oral Q6H   pantoprazole (PROTONIX) IV  40 mg Intravenous Q24H   vancomycin  500 mg Oral Q6H   Continuous Infusions:  sodium chloride Stopped (12/17/2020 1620)   metronidazole 500 mg (01/13/21 0304)   PRN Meds:.fentaNYL (SUBLIMAZE) injection, melatonin, ondansetron (ZOFRAN) IV  Allergies  Allergen Reactions   Codeine Shortness Of Breath   Sulfa Antibiotics Shortness Of Breath    Urinalysis: No results for input(s): COLORURINE, LABSPEC, PHURINE, GLUCOSEU, HGBUR, BILIRUBINUR, KETONESUR, PROTEINUR, UROBILINOGEN, NITRITE, LEUKOCYTESUR in the last 72 hours.  Invalid input(s): APPERANCEUR    Imaging: DG Chest 1 View  Result Date: 01/13/2021 CLINICAL DATA:  70 year old female with history of shortness of breath. EXAM: CHEST  1 VIEW COMPARISON:  Chest x-ray 01/10/2021. FINDINGS: Lung volumes are low. Worsening patchy multifocal interstitial and airspace disease asymmetrically distributed throughout the lungs bilaterally, most confluent throughout the right mid to lower lung. Mild blunting of the costophrenic sulci bilaterally which may suggest trace bilateral pleural effusions. No pneumothorax. Pulmonary vasculature is largely obscured, but does not appearing origin. Heart size is normal. Upper mediastinal contours are within normal limits. Atherosclerotic calcifications in the thoracic aorta. IMPRESSION: 1. The appearance the chest is concerning for severely worsening multilobar bilateral pneumonia. Probable trace bilateral pleural effusions. 2. Aortic atherosclerosis. Electronically  Signed   By: Trudie Reed M.D.   On: 01/13/2021 06:26   MR BRAIN WO CONTRAST  Result Date: 01/11/2021 CLINICAL DATA:  Neuro deficit, acute, stroke suspected EXAM: MRI HEAD WITHOUT CONTRAST TECHNIQUE: Multiplanar, multiecho pulse sequences of the brain and surrounding structures were obtained without intravenous contrast. COMPARISON:  January 09, 2021. FINDINGS: Brain: Punctate focus of restricted diffusion in the right frontal white matter (series 5/6, image 36), suspicious for tiny acute infarct. No significant edema or mass effect. Moderate scattered T2 hyperintensities in the white matter, nonspecific but compatible with chronic microvascular disease. No evidence of acute hemorrhage, hydrocephalus, mass lesion, midline shift, or extra-axial fluid collection. Vascular: Major arterial flow voids are maintained at the skull base. Skull and upper cervical spine: Normal marrow signal. Sinuses/Orbits: Clear sinuses.  Unremarkable orbits. Other: Small bilateral mastoid effusions. IMPRESSION: 1. Punctate focus of restricted diffusion in the right frontal white matter, suspicious for tiny acute infarct. No significant edema or mass effect. 2. Moderate chronic microvascular ischemic disease. Electronically Signed   By: Feliberto Harts M.D.   On: 01/11/2021 14:26   MR THORACIC SPINE WO CONTRAST  Result Date: 01/11/2021 CLINICAL DATA:  Right-sided weakness and lethargy. Possible myelopathy. EXAM: MRI THORACIC SPINE WITHOUT CONTRAST TECHNIQUE: Multiplanar, multisequence MR imaging of the thoracic spine was performed. No intravenous contrast was administered. COMPARISON:  None. FINDINGS: Alignment:  Normal Vertebrae: Normal marrow signal.  No bone lesions or fractures. Cord:  Normal cord signal intensity.  No cord lesions or syrinx. Paraspinal and other soft tissues: No significant paraspinal findings. There are moderate-sized bilateral pleural effusions noted. Disc levels: No significant disc protrusions, spinal  or foraminal stenosis. IMPRESSION: 1. Unremarkable thoracic spine MRI examination. 2. Moderate-sized bilateral pleural effusions. Electronically Signed   By: Rudie Meyer M.D.   On: 01/11/2021 14:31      Assessment/Plan:  Nica Friske is a 70 y.o. female with medical problems of  C Diff, A Fib, HTN, HDL, vasculitis , pleural effusions was admitted on 01/06/2021 for :  Dehydration [E86.0] Hypovolemic shock (HCC) [R57.1] Hyponatremia [E87.1] Weakness [R53.1] Atrial fibrillation with rapid ventricular response (HCC) [  I48.91] Hypotension, unspecified hypotension type [I95.9] Diarrhea, unspecified type [R19.7] Sepsis, due to unspecified organism, unspecified whether acute organ dysfunction present ([place]) [A41.9]  #Acute kidney injury #Chronic kidney disease stage IIIb.  Baseline creatinine 1.61/GFR 35 on January 11, 2021 #Atrial fibrillation with tachycardia #Acute respiratory failure # ? ANCA vasculitis  Urinalysis: January 09, 2021: Hemoglobin negative, protein 100 mg/dL, specific gravity 1.026, 0-5 RBCs, 0-5 WBCs  AKI seems to be secondary to ATN caused by hemodynamic instability  Unfortunately care everywhere records are not available to review details of recent events and hospitalization.  Plan: Patient will need close monitoring of her pulmonary status.  She may end up needing bronchoscopy based on pulmonology assessment. At this time renal disease does not seem to be related to acute vasculitis.  Urinalysis without any hematuria.  Patient has already received rituximab couple months ago as per information available. Agree with aggressive management of pulmonary disease as well as management of atrial fibrillation to achieve hemodynamic stability No acute indication for dialysis.  We will follow closely.    Lauren Bradshaw Candiss Norse 01/13/21

## 2021-01-13 NOTE — Progress Notes (Signed)
NAME:  Lauren Bradshaw, MRN:  [id number], DOB:  [birth year], LOS: 0 ADMISSION DATE:  01/08/2021 CONSULTATION DATE:  01/03/2021             REFERRING MD:  Dr. Kerman Passey CHIEF COMPLAINT:  Weakness, lethargy and abdominal pain     BRIEF SYNOPSIS:  Lauren Bradshaw is a 70 y/o F who presented to [facility] ED for weakness, lethargy, diarrhea, and abdominal pain after recent d/c from rehab for c.diff, Afib, PNA, and ANCA renal vasculitis. Patient placed in ICU care for hemodynamic instability secondary to hypovolemic septic shock d/t pancolitis with multiorgan failure with resp distress, renal failure, cardiac failure with afib, severe leukopenia with severe metabolic acidosis and encephalopathy.   History of Present Illness:  Lauren Bradshaw is a 70 year old female with a history of atrial fibrillation, c. Diff., HTN, HDL, and vasculitis who presents to [facility] ED with complaints of progressive weakness and lethargy. 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 Diff. (She was meant to FU with both renal and pulmonary specialists this week to determine further plan of care.)   She was discharged to her son Lauren Bradshaw) and daughter-in-law's [name]) home able to pivot transfer and was doing quite well. She was reported to have formed stools upon discharge. However, over the weekend, she developed severe diarrhea, abdominal pain, and progressive weakness and lethargy. She had decreased oral intake of both food and water. Patient was unable to get out of bed, EMS was called. She was transferred to [facility] and not back to [facility] d/t positive stroke screen for slurred speech. Code stroke was activated.    Pertinent  Medical History  Atrial fibrillation  C. Diff HTN HDL ANCA renal  vasculitis  PNA w/ respiratory failure  Hypothyroidism    Significant Hospital Events: Including procedures, antibiotic start and stop dates in addition to other pertinent events   ED course: Code stroke activated, CTH obtained, but further testing deferred d/t hemodynamic instability. Patient was given amiodarone bolus for AFIB w/ RVR.    10/31>> The critical care team was consulted for hemodynamic instability. Patient was seen in ED bed, appears to be critically ill - complaining of abdominal pain, CT abdomen/pelvis ordered. Will be transferred to critical care for further workup and treatment.  11/1>> CT abdomen/pelvis confirms pancolitis. Surgery on board for possible colectomy with end iliostomy if worsening condition or increasing lactate. Continue treatment for sepsis and supportive care.  11/2>> Continue to trend lactate. Continue antibiotic treatment, pressure support as needed. No fever overnight. Increasing WBCs and neutrophils. Kidney function stable.  11/3>> Patient feeling better. Abdominal tenderness improved. Kidney function slightly worse. Continue abx treatment to avoid surgery. Increasing WBC count.  11/4>> Developed SOB and hypoxia overnight requiring 15L Hi-Flo O2. Suspected pulmonary edema d/t fluid overload in setting of impaired kidney function. Diuretics given.  CXR shows progressive worsening of b/l infiltrates(11/4)  Micro Data:  10/31>> Blood culture pending (no growth 3 days) 10/31>> Stool PCR - positive for C.Diff    Antimicrobials:  10/31>> metronidazole, vancomycin, cefepime (1x dose)  11/1>> metronidazole, PO vancomycin    Interim History / Subjective:  Developed SOB and hypoxia overnight requiring 15L Hi-Flo O2. Suspected pulmonary edema d/t fluid overload in setting of impaired kidney function. Diuretics given. Patient  supporting own pressure, still in AFIB w/ RVR - per cardio, stop amio drip today and start PO BB. No fever overnight. WBCs continue to  improve.  Kidney function continues to slowly decline. Abdominal tenderness improved. May be able to advance to clear liquid diet today.  Continue treatment for pancolitis & sepsis with IV/PO abx to avoid total colectomy. Surgery following- continue conservative treatment as this time.      Objective   Blood pressure (!) 106/57, pulse (!) 124, temperature (!) 97.5 F (36.4 C), temperature source Oral, resp. rate (!) 32, height '5\' 4"'  (1.626 m), weight 54.7 kg, SpO2 97 %.    >         Intake/Output Summary (Last 24 hours) at 12/12/2020 1642 Last data filed at 12/27/2020 1639    Gross per 24 hour  Intake 2500.67 ml  Output --  Net 2500.67 ml       Filed Weights    01/05/2021 1013  Weight: 54.7 kg      REVIEW OF SYSTEMS Review of Systems  Constitutional:  Positive for malaise/fatigue.  Respiratory:  Positive for shortness of breath. Negative for cough.   Cardiovascular:  Negative for chest pain.  Gastrointestinal:  Positive for abdominal pain. Negative for nausea and vomiting.  Neurological:  Negative for headaches.  Endo/Heme/Allergies:  Bruises/bleeds easily.    PHYSICAL EXAMINATION:   GENERAL: Ill appearing, pallor. A&Ox4.  EYES: Pupils equal, round, reactive to light. No scleral icterus. L eye exophthalmos.  MOUTH: Dry mucous membranes.  PULMONARY: Tachypnea, moderately increased WOB. On 15L heated hi flo O2. Symmetric chest wall movement. Coarse lung sounds throughout. Adequate airflow.  CARDIOVASCULAR: Atrial fibrillation w/ RVR. No MRG. GASTROINTESTINAL: Moderate tenderness to palpation and percussion. Continues to improve. Moderately distended. Positive bowel sounds.  MUSCULOSKELETAL:  3+ pitting edema BLE. 1+ BUE.  NEUROLOGIC: A&Ox4. Generalized weakness, but no focal neurologic deficits.  SKIN: Diffuse petechia BUE (improved), minimal petechia BLE.    Labs/imaging that I havepersonally reviewed  (right click and "Reselect all SmartList Selections" daily)  10/31>>  CTH- There is no acute intracranial hemorrhage or evidence of acute infarction. ASPECT score is 10.  10/31 >> CXR- New airspace opacity in the right suprahilar lung likely due to pneumonia. Radiographic follow-up to resolution is recommended to exclude developing mass. 10/31 >> CT abdomen/pelvis - pancolitis. Left renal subscapular fluid collection, likely hematoma d/t recent renal biopsy.  11/1>> CXR- persistent medial right upper lobe airspace consolidation concerning for pneumonia. Followup PA and lateral chest X-ray is recommended in 3-4 weeks following trial of antibiotic therapy to ensure resolution and exclude underlying malignancy. 11/1>> [place] BLE- No evidence of DVT within either lower extremity. 11/1>> Echo- EF 60-65%, mild LAE (overall normal heart function) 11/2>> KUB - nonobstructive bowel gas pattern. No free intraperitoneal air seen.  11/2>> MR Brain- tiny acute infarct in right frontal lobe. Moderate chronic microvascular ischemic disease.  11/2>> MR T-spine- unremarkable. Mod-sized bilateral pleural effusions.  11/4>> CXR- The appearance the chest is concerning for severely worsening multilobar bilateral pneumonia. Probable trace bilateral pleural effusions.   Resolved Hospital Problem list       ASSESSMENT AND PLAN   SYNOPSIS: Annalis is a 70 y/o F who presented to [facility] ED for weakness, lethargy, diarrhea, and abdominal pain after recent d/c from rehab for c.diff, Afib, PNA, and ANCA renal vasculitis. Patient placed in ICU care for hemodynamic instability secondary to hypovolemic septic shock d/t pancolitis with multiorgan failure with resp distress, renal failure, cardiac failure with  afib, hypoglycemia, severe leukopenia with severe metabolic acidosis and encephalopathy.   #Hypovolemic shock   #Septic shock  #Metabolic acidosis with respiratory compensation (resolved) - Source: c. Diff pancolitis  - Fluid resuscitation as needed.   - Continue PO vanc & flagyl - Use  vasopressors as needed for MAP>65 (not currently needing pressors)  - Trend CBC, procal, lactate, and VBGs; follow up cultures  - Stress-dose Solu-Cortef  - Lactate: 6.4-->5.3-->4.2-->2.1-->1.7 -->1.2    #Abdominal pain  #C.diff Pancolitis  - Severe abdominal tenderness to palpation and percussion (continues to improve) - C. Diff PCR +   - CT abdomen/pelvis w/ contrast -pancolitis, renal hematoma, no abscess, no perforation - Per surgery - will try to treat pancolitis with aggressive abx to avoid surgery. 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 Will continue to trend lactate, follow fever curve; if continues to elevate or be significantly elevated, will plan for urgent surgery. Can start clear liquid diet today.  - Serial exams, lactate 1.2  - Zofran & fentanyl as needed for pain control   #Hypoxia  #Possible pulmonary edema vs. persistent PNA vs. Amiodarone lung toxicity  - 11/1>> CXR: Persistent medial right upper lobe airspace consolidation concerning for pneumonia. Followup PA and lateral chest X-ray is recommended in 3-4 weeks following trial of antibiotic therapy to ensure resolution and exclude underlying malignancy.  -11/3> Will continue to trend WBC, follow fever curve, in case of under-treated PNA. Will continue with current abx treatment at this time and not add additional antimicrobials at risk of worsening c. Diff  -11/4>> patient developed new tachypnea and hypoxia overnight requiring 15L 55% heated high flo O2. CXR this AM concerning for multifocal PNA vs. pulmonary edema. Given that patient is fluid overloaded, will diurese today and reassess CXR tomorrow. Would like  to avoid adding additional abx d/t risk of worsening c.diff.  - Start duonebs/Pulmicort & 1 dose furosemide 1m (repeat CXR tomorrow)  - Discontinue amiodarone per cardiology (hypoxia d/t amiodarone induced lung toxicity)  - BNP elevated at 258.3 - UKoreathoracentesis aspiration results  pending  #Electrolyte derangement  #Hyponatremia  #Hypokalemia  - Trend labs  - Pharmacy consultation, replace (phos, mag, K+) as needed  #Leukopenia (resolved) #Neutropenia  #Immunosuppression  #Acute anemia  - Per Oncology-  neutropenia likely multifactorial from medications and illness and not related to Rituxan and Cytoxan infusion from 2 month ago. No bone marrow biopsy required at this time. Hold Granix as WBC count is improving and patient is afebrile. Continue to monitor hemoglobin.  - Trend CBC (increasing WBCs and stable H&H)   CBC    Component Value Date/Time   WBC 7.8 01/13/2021 0306   RBC 3.54 (L) 01/13/2021 0306   HGB 9.9 (L) 01/13/2021 0306   HCT 29.1 (L) 01/13/2021 0306   PLT 121 (L) 01/13/2021 0306   MCV 82.2 01/13/2021 0306   MCH 28.0 01/13/2021 0306   MCHC 34.0 01/13/2021 0306   RDW 21.1 (H) 01/13/2021 0306   LYMPHSABS 0.3 (L) 01/13/2021 0306   MONOABS 0.3 01/13/2021 0306   EOSABS 0.0 01/13/2021 0306   BASOSABS 0.0 01/13/2021 0306    #Afib w/ RVR   - Per cardiology- amiodarone drip discontinued, started metoprolol 12.549mq6hr PO; titrate as tolerated. Can increase to 25 mg every 6 this evening if blood pressure is okay. - No current anticoagulation, was on Eliquis, but was discontinued several weeks ago d/t downtrending H&H.   #Acute kidney injury/Renal Failure -11/4- kidney function continues to slowly  decline  - 1 dose 30m lasix given this AM  -continue Foley Catheter-assess need -Avoid nephrotoxic agents -Follow BMP & urine output; continue to monitor -Ensure adequate renal perfusion, optimize oxygenation -Renal dose medications   Intake/Output Summary (Last 24 hours) at 01/07/2021 1657 Last data filed at 12/25/2020 1639    Gross per 24 hour  Intake 2500.67 ml  Output --  Net 2500.67 ml   BMP Latest Ref Rng & Units 01/13/2021 01/12/2021 01/11/2021  Glucose 70 - 99 mg/dL 115(H) 112(H) 78  BUN 8 - 23 mg/dL 34(H) 30(H) 28(H)  Creatinine 0.44 -  1.00 mg/dL 1.88(H) 1.82(H) 1.61(H)  Sodium 135 - 145 mmol/L 129(L) 128(L) 129(L)  Potassium 3.5 - 5.1 mmol/L 3.3(L) 3.7 3.8  Chloride 98 - 111 mmol/L 93(L) 93(L) 94(L)  CO2 22 - 32 mmol/L 21(L) 24 26  Calcium 8.9 - 10.3 mg/dL 7.7(L) 7.1(L) 6.7(L)    #Hypothyroidism  - Levothyroxine 75 mcg once a day   Best practice (right click and "Reselect all SmartList Selections" daily)  Diet: NPO Pain/Anxiety/Delirium protocol (if indicated): No VAP protocol (if indicated): Not indicated DVT prophylaxis: SCDs GI prophylaxis: PPI Glucose control:  SSI No Central venous access:  N/A Arterial line:  N/A Foley:  N/A Mobility:  bed rest  Code Status:  FULL Disposition: ICU     DVT/GI PRX  assessed I Assessed the need for Labs I Assessed the need for Foley I Assessed the need for Central Venous Line Family Discussion when available I Assessed the need for Mobilization I made an Assessment of medications to be adjusted accordingly Safety Risk assessment completed  CASE DISCUSSED IN MULTIDISCIPLINARY ROUNDS WITH ICU TEAM     Critical Care Time devoted to patient care services described in this note is 65 minutes.  Critical care was necessary to treat /prevent imminent and life-threatening deterioration. Overall, patient is critically ill, prognosis is guarded.    KCorrin Parker M.D.  LVelora HecklerPulmonary & Critical Care Medicine  Medical Director IDoomsDirector APinnacle Orthopa[facility] LLCCardio-Pulmonary Department

## 2021-01-13 NOTE — Progress Notes (Signed)
[facility] Cardiology  CARDIOLOGY CONSULT NOTE  Patient ID: Noellie Handrahan MRN: [id number] DOB/AGE: 68-Jan-1952 68 y.o.  Admit date: 12/19/2020 Referring Physician Harvest Dark Primary Physician Kateri Mc, MD Primary Cardiologist NA Reason for Consultation AF with RVR  HPI:  Arlie Frohn is a 70 year old female with a history of atrial fibrillation, hypertension, hyperlipidemia, vasculitis who presents to the emergency department with generalized weakness and was subsequently discovered to be in atrial fibrillation with RVR in the setting of sepsis and multisystem organ failure.   Interval history: - Metoprolol 12.5 mg q6 hrs not started yesterday. Ordered today. - CXR shows worsening BL infiltrates, R> L. Worsening hypoxia.  - HR remains elevated to 140.  Review of systems complete and found to be negative unless listed above     Past Medical History:  Diagnosis Date   Atrial fibrillation (HCC)    C. difficile diarrhea    GERD (gastroesophageal reflux disease)    Hyperlipidemia    Hypertension    Insomnia    Seizure ([place])    Thyroid disease    Vasculitis ([place])       Medications Prior to Admission  Medication Sig Dispense Refill Last Dose   amLODipine (NORVASC) 10 MG tablet Take 10 mg by mouth daily.      atenolol (TENORMIN) 25 MG tablet Take 25 mg by mouth daily.      atorvastatin (LIPITOR) 40 MG tablet Take 40 mg by mouth daily.      carvedilol (COREG) 6.25 MG tablet Take 6.25 mg by mouth 2 (two) times daily.      diclofenac (VOLTAREN) 75 MG EC tablet Take 75 mg by mouth 2 (two) times daily.      furosemide (LASIX) 20 MG tablet Take 20 mg by mouth 2 (two) times daily.      gabapentin (NEURONTIN) 600 MG tablet Take 600 mg by mouth 4 (four) times daily.      ibuprofen (ADVIL) 800 MG tablet Take 800 mg by mouth 3 (three) times daily.      levothyroxine (SYNTHROID) 75 MCG tablet Take 75 mcg by mouth daily.      metoprolol succinate (TOPROL-XL) 50 MG 24 hr tablet Take 50 mg by  mouth daily.      montelukast (SINGULAIR) 10 MG tablet Take 10 mg by mouth at bedtime.      omeprazole (PRILOSEC) 40 MG capsule Take 1 capsule by mouth in the morning.      ondansetron (ZOFRAN) 4 MG tablet Take by mouth.      pantoprazole (PROTONIX) 40 MG tablet Take 40 mg by mouth 2 (two) times daily.      potassium chloride SA (KLOR-CON) 20 MEQ tablet Take 20 mEq by mouth daily.      rosuvastatin (CRESTOR) 20 MG tablet Take 20 mg by mouth at bedtime.      triamterene-hydrochlorothiazide (DYAZIDE) 37.5-25 MG capsule Take 1 capsule by mouth every morning.      cefUROXime (CEFTIN) 500 MG tablet Take 500 mg by mouth 2 (two) times daily. (Patient not taking: No sig reported)   Completed Course    Social History   Socioeconomic History   Marital status: Married    Spouse name: Not on file   Number of children: Not on file   Years of education: Not on file   Highest education level: Not on file  Occupational History   Not on file  Tobacco Use   Smoking status: Never   Smokeless tobacco: Never  Substance and Sexual  Activity   Alcohol use: Not Currently   Drug use: Not on file   Sexual activity: Not on file  Other Topics Concern   Not on file  Social History Narrative   Not on file   Social Determinants of Health   Financial Resource Strain: Not on file  Food Insecurity: Not on file  Transportation Needs: Not on file  Physical Activity: Not on file  Stress: Not on file  Social Connections: Not on file  Intimate Partner Violence: Not on file    No family history on file.    Review of systems complete and found to be negative unless listed above      PHYSICAL EXAM  General: Lethargic appearing with elevated respiratory rate. HEENT:  Normocephalic and atramatic Neck:  No JVD.  Lungs: Clear in anterior lung fields.  Tachypneic. Heart: Irregularly irregular.  Tachycardic.Marland Kitchen Normal S1 and S2 without gallops or murmurs.  Abdomen: Bowel sounds are positive, abdomen soft and  non-tender  Msk:  Back normal, normal gait. Normal strength and tone for age. Extremities: No clubbing, cyanosis or edema.   Neuro: Unable to answer questions.  No obvious focal deficits. Psych: Unable to accurately assess.  Labs:   Lab Results  Component Value Date   WBC 7.8 01/13/2021   HGB 9.9 (L) 01/13/2021   HCT 29.1 (L) 01/13/2021   MCV 82.2 01/13/2021   PLT 121 (L) 01/13/2021    Recent Labs  Lab 01/10/21 1425 01/10/21 2032 01/13/21 0306  NA 132*   < > 129*  K 2.7*   < > 3.3*  CL 93*   < > 93*  CO2 23   < > 21*  BUN 28*   < > 34*  CREATININE 1.61*   < > 1.88*  CALCIUM 6.7*   < > 7.7*  PROT 3.8*  --   --   BILITOT 0.8  --   --   ALKPHOS 130*  --   --   ALT 10  --   --   AST 18  --   --   GLUCOSE 111*   < > 115*   < > = values in this interval not displayed.    No results found for: CKTOTAL, CKMB, CKMBINDEX, TROPONINI No results found for: CHOL No results found for: HDL No results found for: LDLCALC No results found for: TRIG No results found for: CHOLHDL No results found for: LDLDIRECT    Radiology: CT ABDOMEN PELVIS WO CONTRAST  Addendum Date: 12/24/2020   ADDENDUM REPORT: 12/14/2020 16:17 ADDENDUM: Additional clinical history has been provided. The patient recently underwent a random left renal biopsy a few weeks ago that a required embolization. As such, the left renal subcapsular fluid collection likely represents old hematoma and the previously described 4 mm left renal calculus is likely a vascular coil. Electronically Signed   By: Titus Dubin M.D.   On: 12/22/2020 16:17   Result Date: 12/15/2020 CLINICAL DATA:  Diarrhea and weakness. EXAM: CT ABDOMEN AND PELVIS WITHOUT CONTRAST TECHNIQUE: Multidetector CT imaging of the abdomen and pelvis was performed following the standard protocol without IV contrast. COMPARISON:  None. FINDINGS: Lower chest: Small bilateral pleural effusions. Trace pericardial effusion. Mild bibasilar atelectasis/scarring.  Hepatobiliary: Small calcification in the right hepatic lobe. No other focal liver abnormality. Mildly distended gallbladder without wall thickening or radiopaque gallstones. No biliary dilatation. Pancreas: Unremarkable. No pancreatic ductal dilatation or surrounding inflammatory changes. Spleen: Normal in size without focal abnormality. Adrenals/Urinary Tract: The adrenal glands  and right kidney are unremarkable. 3.9 x 5.5 x 8.7 cm low-density left renal subcapsular fluid collection (series 2, image 36; series 6, image 83). 4 mm calculus in the left kidney. No hydronephrosis. The bladder is unremarkable. Stomach/Bowel: Moderate hiatal hernia. The stomach is otherwise within normal limits. Severe circumferential wall thickening of the entire colon. No pneumatosis. The small bowel is unremarkable. Normal appendix. Vascular/Lymphatic: Aortic atherosclerosis. No enlarged abdominal or pelvic lymph nodes. Reproductive: Prior hysterectomy. 3.9 cm simple appearing cyst in the right ovary. Other: Trace ascites around the liver, in both paracolic gutters, and in the pelvis. Prominent presacral soft tissue stranding. No pneumoperitoneum. Musculoskeletal: No acute or significant osseous findings. IMPRESSION: 1. Severe circumferential wall thickening of the entire colon, consistent with pancolitis. 2. 8.7 cm low-density left renal subcapsular fluid collection, concerning for abscess. Correlate with urinalysis. 3. Trace ascites.  Small bilateral pleural effusions. 4. 3.9 cm simple appearing right ovarian cyst. Recommend outpatient follow-up with pelvic US. Reference: JACR 2020 Feb;17(2):248-254 5. Aortic Atherosclerosis (ICD10-I70.0). Electronically Signed: By: Titus Dubin M.D. On: 01/02/2021 16:01   DG Chest 1 View  Result Date: 01/13/2021 CLINICAL DATA:  70 year old female with history of shortness of breath. EXAM: CHEST  1 VIEW COMPARISON:  Chest x-ray 01/10/2021. FINDINGS: Lung volumes are low. Worsening patchy  multifocal interstitial and airspace disease asymmetrically distributed throughout the lungs bilaterally, most confluent throughout the right mid to lower lung. Mild blunting of the costophrenic sulci bilaterally which may suggest trace bilateral pleural effusions. No pneumothorax. Pulmonary vasculature is largely obscured, but does not appearing origin. Heart size is normal. Upper mediastinal contours are within normal limits. Atherosclerotic calcifications in the thoracic aorta. IMPRESSION: 1. The appearance the chest is concerning for severely worsening multilobar bilateral pneumonia. Probable trace bilateral pleural effusions. 2. Aortic atherosclerosis. Electronically Signed   By: Vinnie Langton M.D.   On: 01/13/2021 06:26   DG Chest 1 View  Result Date: 01/10/2021 CLINICAL DATA:  70 year old female with history of shortness of breath. EXAM: CHEST  1 VIEW COMPARISON:  Chest x-ray 01/02/2021. FINDINGS: Lung volumes are low. Persistent ill-defined opacity in the medial aspect of the right upper lobe, unchanged. Left lung appears clear. Known small bilateral pleural effusions are not readily apparent on this single AP view. No pneumothorax. No evidence of pulmonary edema. Heart size is borderline enlarged. Atherosclerotic calcifications in the thoracic aorta. IMPRESSION: 1. Persistent medial right upper lobe airspace consolidation concerning for pneumonia. Followup PA and lateral chest X-ray is recommended in 3-4 weeks following trial of antibiotic therapy to ensure resolution and exclude underlying malignancy. 2. Aortic atherosclerosis. 3. Small bilateral pleural effusions noted on yesterday's CT the abdomen and pelvis are not readily apparent on today's plain film examination. Electronically Signed   By: Vinnie Langton M.D.   On: 01/10/2021 05:31   MR BRAIN WO CONTRAST  Result Date: 01/11/2021 CLINICAL DATA:  Neuro deficit, acute, stroke suspected EXAM: MRI HEAD WITHOUT CONTRAST TECHNIQUE:  Multiplanar, multiecho pulse sequences of the brain and surrounding structures were obtained without intravenous contrast. COMPARISON:  January 09, 2021. FINDINGS: Brain: Punctate focus of restricted diffusion in the right frontal white matter (series 5/6, image 36), suspicious for tiny acute infarct. No significant edema or mass effect. Moderate scattered T2 hyperintensities in the white matter, nonspecific but compatible with chronic microvascular disease. No evidence of acute hemorrhage, hydrocephalus, mass lesion, midline shift, or extra-axial fluid collection. Vascular: Major arterial flow voids are maintained at the skull base. Skull and upper cervical spine: Normal  marrow signal. Sinuses/Orbits: Clear sinuses.  Unremarkable orbits. Other: Small bilateral mastoid effusions. IMPRESSION: 1. Punctate focus of restricted diffusion in the right frontal white matter, suspicious for tiny acute infarct. No significant edema or mass effect. 2. Moderate chronic microvascular ischemic disease. Electronically Signed   By: Margaretha Sheffield M.D.   On: 01/11/2021 14:26   MR THORACIC SPINE WO CONTRAST  Result Date: 01/11/2021 CLINICAL DATA:  Right-sided weakness and lethargy. Possible myelopathy. EXAM: MRI THORACIC SPINE WITHOUT CONTRAST TECHNIQUE: Multiplanar, multisequence MR imaging of the thoracic spine was performed. No intravenous contrast was administered. COMPARISON:  None. FINDINGS: Alignment:  Normal Vertebrae: Normal marrow signal.  No bone lesions or fractures. Cord:  Normal cord signal intensity.  No cord lesions or syrinx. Paraspinal and other soft tissues: No significant paraspinal findings. There are moderate-sized bilateral pleural effusions noted. Disc levels: No significant disc protrusions, spinal or foraminal stenosis. IMPRESSION: 1. Unremarkable thoracic spine MRI examination. 2. Moderate-sized bilateral pleural effusions. Electronically Signed   By: Marijo Sanes M.D.   On: 01/11/2021 14:31   US  Venous Img Lower Bilateral (DVT)  Result Date: 01/10/2021 CLINICAL DATA:  Bilateral lower extremity pain and edema. Evaluate for DVT. EXAM: BILATERAL LOWER EXTREMITY VENOUS DOPPLER ULTRASOUND TECHNIQUE: Gray-scale sonography with graded compression, as well as color Doppler and duplex ultrasound were performed to evaluate the lower extremity deep venous systems from the level of the common femoral vein and including the common femoral, femoral, profunda femoral, popliteal and calf veins including the posterior tibial, peroneal and gastrocnemius veins when visible. The superficial great saphenous vein was also interrogated. Spectral Doppler was utilized to evaluate flow at rest and with distal augmentation maneuvers in the common femoral, femoral and popliteal veins. COMPARISON:  None. FINDINGS: RIGHT LOWER EXTREMITY Common Femoral Vein: No evidence of thrombus. Normal compressibility, respiratory phasicity and response to augmentation. Saphenofemoral Junction: No evidence of thrombus. Normal compressibility and flow on color Doppler imaging. Profunda Femoral Vein: No evidence of thrombus. Normal compressibility and flow on color Doppler imaging. Femoral Vein: No evidence of thrombus. Normal compressibility, respiratory phasicity and response to augmentation. Popliteal Vein: No evidence of thrombus. Normal compressibility, respiratory phasicity and response to augmentation. Calf Veins: No evidence of thrombus. Normal compressibility and flow on color Doppler imaging. Superficial Great Saphenous Vein: No evidence of thrombus. Normal compressibility. Venous Reflux:  None. Other Findings:  None. LEFT LOWER EXTREMITY Common Femoral Vein: Not visualized secondary to bandage associated with left femoral approach central venous catheter. Saphenofemoral Junction: Not visualized secondary to bandages so shaded with left femoral approach central venous catheter. Profunda Femoral Vein: No evidence of thrombus. Normal  compressibility and flow on color Doppler imaging. Femoral Vein: No evidence of thrombus. Normal compressibility, respiratory phasicity and response to augmentation. Popliteal Vein: No evidence of thrombus. Normal compressibility, respiratory phasicity and response to augmentation. Calf Veins: No evidence of thrombus. Normal compressibility and flow on color Doppler imaging. Superficial Great Saphenous Vein: No evidence of thrombus. Normal compressibility. Venous Reflux:  None. Other Findings:  None. IMPRESSION: No evidence of DVT within either lower extremity. Electronically Signed   By: Sandi Mariscal M.D.   On: 01/10/2021 08:12   DG Chest Port 1 View  Result Date: 01/03/2021 CLINICAL DATA:  Possible sepsis Right-sided weakness and lethargic EXAM: PORTABLE CHEST 1 VIEW COMPARISON:  01/07/2019 FINDINGS: Heart size within normal limits. Mild pulmonary vascular congestion. There is new airspace opacity in the right suprahilar lung. IMPRESSION: New airspace opacity in the right suprahilar lung likely due  to pneumonia. Radiographic follow-up to resolution is recommended to exclude developing mass. Electronically Signed   By: Acquanetta Belling M.D.   On: Jan 28, 2021 14:08   DG Abd Portable 1V  Result Date: 01/11/2021 CLINICAL DATA:  Colitis, weakness. EXAM: PORTABLE ABDOMEN - 1 VIEW COMPARISON:  CT abdomen pelvis 01-28-2021 FINDINGS: Scattered air is seen in the small bowel and colon with a nonobstructive bowel gas pattern. No free intraperitoneal air given the limits of the supine images. Left femoral central venous catheter tip projects at the level of the left common iliac vein. Left renal embolization coil. A 1 cm nodular opacity overlying the right lung base is favored to represent a nipple shadow. IMPRESSION: 1. Nonobstructive bowel gas pattern. 2. Interval placement of left femoral central venous catheter with tip overlying the expected location of the left common iliac vein. Electronically Signed   By: Sherron Ales M.D.   On: 01/11/2021 08:53   ECHOCARDIOGRAM COMPLETE  Result Date: 01/10/2021    ECHOCARDIOGRAM REPORT   Patient Name:   SNIGDHA HOWSER Date of Exam: 01/10/2021 Medical Rec #:  [id number]  Height:       64.0 in Accession #:    [id number] Weight:       120.6 lb Date of Birth:  Jun 01, 1950   BSA:          1.578 m Patient Age:    70 years   BP:           94/58 mmHg Patient Gender: F          HR:           132 bpm. Exam Location:  [facility] Procedure: 2D Echo, Cardiac Doppler and Color Doppler Indications:     Atrial Fibrillation I48.91  History:         Patient has no prior history of Echocardiogram examinations.                  Arrythmias:Atrial Fibrillation; Risk Factors:Hypertension and                  Dyslipidemia.  Sonographer:     Cristela Blue Referring Phys:  [id number] Trixie Deis Davarion Cuffee Diagnosing Phys: Sena Slate  Sonographer Comments: Suboptimal apical window. IMPRESSIONS  1. Left ventricular ejection fraction, by estimation, is 60 to 65%. The left ventricle has normal function. The left ventricle has no regional wall motion abnormalities. Left ventricular diastolic parameters are indeterminate.  2. Right ventricular systolic function was not well visualized. The right ventricular size is not well visualized.  3. Left atrial size was mildly dilated.  4. The mitral valve is normal in structure. No evidence of mitral valve regurgitation. No evidence of mitral stenosis.  5. The aortic valve was not well visualized. Aortic valve regurgitation is not visualized.  6. The inferior vena cava is normal in size with greater than 50% respiratory variability, suggesting right atrial pressure of 3 mmHg. FINDINGS  Left Ventricle: Left ventricular ejection fraction, by estimation, is 60 to 65%. The left ventricle has normal function. The left ventricle has no regional wall motion abnormalities. The left ventricular internal cavity size was small. There is no left ventricular hypertrophy. Left ventricular diastolic parameters  are indeterminate. Right Ventricle: The right ventricular size is not well visualized. Right vetricular wall thickness was not well visualized. Right ventricular systolic function was not well visualized. Left Atrium: Left atrial size was mildly dilated. Right Atrium: Right atrial size was not well visualized. Pericardium: There is no evidence  of pericardial effusion. Mitral Valve: The mitral valve is normal in structure. No evidence of mitral valve regurgitation. No evidence of mitral valve stenosis. MV peak gradient, 7.0 mmHg. The mean mitral valve gradient is 3.0 mmHg. Tricuspid Valve: The tricuspid valve is not well visualized. Tricuspid valve regurgitation is not demonstrated. Aortic Valve: The aortic valve was not well visualized. Aortic valve regurgitation is not visualized. Aortic valve mean gradient measures 3.0 mmHg. Aortic valve peak gradient measures 6.9 mmHg. Aortic valve area, by VTI measures 2.54 cm. Pulmonic Valve: The pulmonic valve was not well visualized. Pulmonic valve regurgitation is not visualized. No evidence of pulmonic stenosis. Aorta: The aortic root was not well visualized. Venous: The inferior vena cava is normal in size with greater than 50% respiratory variability, suggesting right atrial pressure of 3 mmHg. IAS/Shunts: The interatrial septum was not well visualized.  LEFT VENTRICLE PLAX 2D LVIDd:         3.97 cm   Diastology LVIDs:         2.89 cm   LV e' medial:    9.68 cm/s LV PW:         1.23 cm   LV E/e' medial:  9.9 LV IVS:        0.85 cm   LV e' lateral:   10.80 cm/s LVOT diam:     2.00 cm   LV E/e' lateral: 8.8 LV SV:         38 LV SV Index:   24 LVOT Area:     3.14 cm  RIGHT VENTRICLE RV Basal diam:  3.53 cm RV S prime:     14.90 cm/s TAPSE (M-mode): 3.8 cm LEFT ATRIUM             Index        RIGHT ATRIUM           Index LA diam:        4.50 cm 2.85 cm/m   RA Area:     17.00 cm LA Vol (A2C):   44.1 ml 27.95 ml/m  RA Volume:   49.00 ml  31.05 ml/m LA Vol (A4C):   72.0 ml  45.63 ml/m LA Biplane Vol: 59.0 ml 37.39 ml/m  AORTIC VALVE                    PULMONIC VALVE AV Area (Vmax):    2.45 cm     PV Vmax:        1.04 m/s AV Area (Vmean):   2.76 cm     PV Vmean:       71.600 cm/s AV Area (VTI):     2.54 cm     PV VTI:         0.137 m AV Vmax:           131.00 cm/s  PV Peak grad:   4.3 mmHg AV Vmean:          79.800 cm/s  PV Mean grad:   2.0 mmHg AV VTI:            0.151 m      RVOT Peak grad: 4 mmHg AV Peak Grad:      6.9 mmHg AV Mean Grad:      3.0 mmHg LVOT Vmax:         102.00 cm/s LVOT Vmean:        70.000 cm/s LVOT VTI:          0.122 m LVOT/AV VTI  ratio: 0.81  AORTA Ao Root diam: 2.80 cm MITRAL VALVE               TRICUSPID VALVE MV Area (PHT): 3.21 cm    TR Peak grad:   9.2 mmHg MV Area VTI:   2.25 cm    TR Vmax:        152.00 cm/s MV Peak grad:  7.0 mmHg MV Mean grad:  3.0 mmHg    SHUNTS MV Vmax:       1.32 m/s    Systemic VTI:  0.12 m MV Vmean:      81.1 cm/s   Systemic Diam: 2.00 cm MV Decel Time: 236 msec    Pulmonic VTI:  0.111 m MV E velocity: 95.50 cm/s MV A velocity: 83.10 cm/s MV E/A ratio:  1.15 Donnelly Angelica Electronically signed by Donnelly Angelica Signature Date/Time: 01/10/2021/1:17:36 PM    Final    CT HEAD CODE STROKE WO CONTRAST  Result Date: 12/22/2020 CLINICAL DATA:  Code stroke. EXAM: CT HEAD WITHOUT CONTRAST TECHNIQUE: Contiguous axial images were obtained from the base of the skull through the vertex without intravenous contrast. COMPARISON:  None. FINDINGS: Brain: There is no acute intracranial hemorrhage, mass effect, or edema. Gray-white differentiation is preserved. Ventricles and sulci are normal in size and configuration. Patchy low-density in the supratentorial white matter is nonspecific but may reflect chronic microvascular ischemic changes. No extra-axial collection. Vascular: No hyperdense vessel. There is intracranial atherosclerotic calcification at the skull base. Skull: Unremarkable. Sinuses/Orbits: No acute or significant abnormality.  Other: Mastoid air cells are clear. ASPECTS ([place] Stroke Program Early CT Score) - Ganglionic level infarction (caudate, lentiform nuclei, internal capsule, insula, M1-M3 cortex): 7 - Supraganglionic infarction (M4-M6 cortex): 3 Total score (0-10 with 10 being normal): 10 IMPRESSION: There is no acute intracranial hemorrhage or evidence of acute infarction. ASPECT score is 10. Chronic microvascular ischemic changes. These results were called by telephone at the time of interpretation on 12/25/2020 at 10:10 am to provider [facility] , who verbally acknowledged these results. Electronically Signed   By: Macy Mis M.D.   On: 01/06/2021 10:11    EKG: Atrial fibrillation with RVR.  ASSESSMENT AND PLAN:  Christin Hedding is a 70 year old female with a history of atrial fibrillation, hypertension, hyperlipidemia, vasculitis who presents to the emergency department with generalized weakness and was subsequently discovered to be in atrial fibrillation with RVR. She also has multisystem organ failure possibly related to sepsis.  # AF with RVR # Multisystem organ failure, Severe sepsis The patient presents with fatigue and was subsequently discovered to have neutropenia, elevated creatinine, and altered mental status.  The etiology of this is severe sepsis d/t C. difficile.  In the setting she has atrial fibrillation with RVR to the 150s with relative hypotension.  The atrial fibrillation is certainly a reflection of her severe disease state. Echo shows normal EF.  She is developed worsening respiratory status today which is of unclear etiology.  This may be related to pulmonary edema, but we must also consider amiodarone toxicity, as well as development of infection. -Recommend ongoing management of medical issues per primary inpatient team. -IV amiodarone.  I am worried this may be causing amiodarone lung toxicity. - Start metoprolol 12.5 mg q6. Titrate as tolerated.  Can increase to 25 mg every 6 this  evening if blood pressure is okay. -Would hold on anticoagulation at this time given her recent bleeding issues and need for transfusion. - If she develops worsened hypotension  thought to be related to AF, follow ACLS guidelines.  -Agree with trial of Lasix, though initially her IVC was small and collapsible suggesting hypovolemia.   Signed: Andrez Grime MD 01/13/2021, 8:08 AM

## 2021-01-13 NOTE — Procedures (Signed)
Interventional Radiology Procedure:   Indications: Pleural effusions  Procedure: US guided thoracentesis  Findings: Removed 450 ml from left chest.  Complications: None     EBL: Less than 10 ml  Plan: Follow up CXR   Isamu Trammel R. Lowella Dandy, MD  Pager: [phone]

## 2021-01-13 NOTE — Progress Notes (Signed)
PT Cancellation Note  Patient Details Name: Lauren Bradshaw MRN: [id number] DOB: [birth year]   Cancelled Treatment:    Reason Eval/Treat Not Completed: Medical issues which prohibited therapy (Per chart review, pt's HR in 140s this morning, still poorly controlled despite efforts. Will defer evaluation to later date/time once medically ready as inititation of gross motor activity is likely to add cardiac demand.)   9:36 AM, 01/13/21 Rosamaria Lints, PT, DPT Physical Therapist - [facility]  [phone] (ASCOM)    [name] 01/13/2021, 9:36 AM

## 2021-01-13 NOTE — Progress Notes (Signed)
PHARMACY CONSULT NOTE - FOLLOW UP  Pharmacy Consult for Electrolyte Monitoring and Replacement   Recent Labs: Potassium (mmol/L)  Date Value  01/13/2021 3.3 (L)   Magnesium (mg/dL)  Date Value  99/35/7017 2.5 (H)   Calcium (mg/dL)  Date Value  79/39/0300 7.7 (L)   Albumin (g/dL)  Date Value  92/33/0076 1.7 (L)   Phosphorus (mg/dL)  Date Value  22/63/3354 3.9   Sodium (mmol/L)  Date Value  01/13/2021 129 (L)    Assessment: 70 y.o. female who presents emergency department for generalized fatigue/weakness. Pharmacy has been consulted for electrolyte replacement.    Goal of Therapy:  K 4 - 5.1 mmol/L Mg 2 - 2.4 mg/dL All other electrolytes WNL  Plan:  Potassium 40 mEq PO given this morning Follow up with morning labs  Pricilla Riffle, PharmD, BCPS Clinical Pharmacist 01/13/2021 11:39 AM

## 2021-01-13 NOTE — Progress Notes (Signed)
[facility] SURGICAL PROGRESS NOTE (cpt [id number])  Hospital Day(s): 4.   Interval History: Patient seen and examined. Overnight, she had issues with hypoxia requiring 6L [place] and then transition to NRB. Her WBC continues to improve and remain in normal ranges at 7.8K this morning. Hgb remains stable at 9.9.  Renal function is slightly worse this morning with sCr -1.88; UO - 375 ccs. Hyponatremia to 198, hypokalemia to 3.3. Lactic acid levels remain normal this morning at 1.2.   This morning, she reports that she feels her breathing is improving but still needing NRB and have tachypnea. She continues to endorse improvement in her abdominal pain. No fever, chills, nausea, emesis. She has remained off vasopressor support. She continues to be in atrial fibrillation with RVR; on amiodarone; cardiology following. Oncology also following for neutropenia. She tolerated sips of water/ice yesterday without issues. She continues on IV Flagyl and PO Vancomycin      Review of Systems:  Constitutional: denied fever, chills  HEENT: denies cough or congestion  Respiratory: + shortness of breath  Cardiovascular: denies chest pain or palpitations  Gastrointestinal: + abdominal pain (improving), denied nausea, emesis Genitourinary: denies burning with urination or urinary frequency  Vital signs in last 24 hours: [min-max] current  Temp:  [97.6 F (36.4 C)-98.1 F (36.7 C)] 97.6 F (36.4 C) (11/04 0400) Pulse Rate:  [61-159] 129 (11/04 0700) Resp:  [20-35] 28 (11/04 0700) BP: (90-139)/(60-129) 108/70 (11/04 0700) SpO2:  [87 %-96 %] 93 % (11/04 0700) FiO2 (%):  [50 %] 50 % (11/04 0130)     Height: 5\' 4"  (162.6 cm) Weight: 54.7 kg     Intake/Output last 2 shifts:  11/03 0701 - 11/04 0700 In: 823.9 [P.O.:120; I.V.:403.9; IV Piggyback:300] Out: 375 [Urine:375]   Physical Exam:  Constitutional: alert, cooperative and no distress, sitting up in bed this morning HENT: normocephalic without  obvious abnormality  Eyes: PERRL, EOM's grossly intact and symmetric  Respiratory:On NRB this morning, tachypneic   Cardiovascular: Tachycardic, irregular Gastrointestinal: Soft, she winces initially with palpation but continued palpation/distraction does not yield much tenderness, non-distended, she is not overtly peritonitic  Genitourinary: foley catheter in place Musculoskeletal: 1+ pitting edema bilateral LE   Labs:  CBC Latest Ref Rng & Units 01/13/2021 01/12/2021 01/11/2021  WBC 4.0 - 10.5 K/uL 7.8 5.1 2.5(L)  Hemoglobin 12.0 - 15.0 g/dL 9.9(L) 9.5(L) 9.1(L)  Hematocrit 36.0 - 46.0 % 29.1(L) 28.3(L) 26.2(L)  Platelets 150 - 400 K/uL 121(L) 123(L) 156   CMP Latest Ref Rng & Units 01/13/2021 01/12/2021 01/11/2021  Glucose 70 - 99 mg/dL 115(H) 112(H) 78  BUN 8 - 23 mg/dL 34(H) 30(H) 28(H)  Creatinine 0.44 - 1.00 mg/dL 1.88(H) 1.82(H) 1.61(H)  Sodium 135 - 145 mmol/L 129(L) 128(L) 129(L)  Potassium 3.5 - 5.1 mmol/L 3.3(L) 3.7 3.8  Chloride 98 - 111 mmol/L 93(L) 93(L) 94(L)  CO2 22 - 32 mmol/L 21(L) 24 26  Calcium 8.9 - 10.3 mg/dL 7.7(L) 7.1(L) 6.7(L)  Total Protein 6.5 - 8.1 g/dL - - -  Total Bilirubin 0.3 - 1.2 mg/dL - - -  Alkaline Phos 38 - 126 U/L - - -  AST 15 - 41 U/L - - -  ALT 0 - 44 U/L - - -    Imaging studies: No new pertinent imaging studies   Assessment/Plan: (ICD-10's: A59.72) 70 y.o. female admitted with pancolitis secondary to Clostridium difficile, without overt evidence of toxic megacolon or impending perforation currently, complicated by atrial fibrillation with RVR  -  from an abdominal perspective, she continues to make objective improvements concomitant with improvement in her laboratory values. She did have some degree of respiratory decompensation overnight, but suspect this is a separate process. No need for urgent surgical intervention currently, but we will of course follow closely. Should she worsen or deteriorate from an abdominal perspective, we will  need to proceed with urgent colectomy and end ileostomy. She is understanding of this.              - Okay for CLD from abdominal perspective after planned procedures (thoracentesis) today - Continue IV Flagyl and PO Vancomycin              - Monitor abdominal examination closely +/- serial AXR              - Continue IVF support - Monitor leukopenia; resolved - Monitor renal function; UO; worsening  - Monitor lactic acid level; normalized x2 - Appreciate cardiology assistance - Appreciate oncology input  - Appreciate PCCM assistance; we will follow     All of the above findings and recommendations were discussed with the patient, and the medical team, and all of patient's questions were answered to her expressed satisfaction.  -- Lauren Oxford, PA-C [facility] 01/13/2021, 7:24 AM [phone] M-F: 7am - 4pm

## 2021-01-14 ENCOUNTER — Inpatient Hospital Stay: Payer: Medicare HMO

## 2021-01-14 DIAGNOSIS — E871 Hypo-osmolality and hyponatremia: Secondary | ICD-10-CM

## 2021-01-14 DIAGNOSIS — A0472 Enterocolitis due to Clostridium difficile, not specified as recurrent: Secondary | ICD-10-CM | POA: Diagnosis not present

## 2021-01-14 DIAGNOSIS — E860 Dehydration: Secondary | ICD-10-CM | POA: Diagnosis not present

## 2021-01-14 DIAGNOSIS — J9601 Acute respiratory failure with hypoxia: Secondary | ICD-10-CM

## 2021-01-14 DIAGNOSIS — R197 Diarrhea, unspecified: Secondary | ICD-10-CM | POA: Diagnosis not present

## 2021-01-14 DIAGNOSIS — R0989 Other specified symptoms and signs involving the circulatory and respiratory systems: Secondary | ICD-10-CM

## 2021-01-14 DIAGNOSIS — K51018 Ulcerative (chronic) pancolitis with other complication: Secondary | ICD-10-CM | POA: Diagnosis not present

## 2021-01-14 DIAGNOSIS — A419 Sepsis, unspecified organism: Secondary | ICD-10-CM | POA: Diagnosis not present

## 2021-01-14 LAB — CBC WITH DIFFERENTIAL/PLATELET
Abs Immature Granulocytes: 0.78 10*3/uL — ABNORMAL HIGH (ref 0.00–0.07)
Basophils Absolute: 0.1 10*3/uL (ref 0.0–0.1)
Basophils Relative: 1 %
Eosinophils Absolute: 0 10*3/uL (ref 0.0–0.5)
Eosinophils Relative: 0 %
HCT: 26.8 % — ABNORMAL LOW (ref 36.0–46.0)
Hemoglobin: 9.2 g/dL — ABNORMAL LOW (ref 12.0–15.0)
Immature Granulocytes: 14 %
Lymphocytes Relative: 6 %
Lymphs Abs: 0.3 10*3/uL — ABNORMAL LOW (ref 0.7–4.0)
MCH: 28.2 pg (ref 26.0–34.0)
MCHC: 34.3 g/dL (ref 30.0–36.0)
MCV: 82.2 fL (ref 80.0–100.0)
Monocytes Absolute: 0.3 10*3/uL (ref 0.1–1.0)
Monocytes Relative: 5 %
Neutro Abs: 4.2 10*3/uL (ref 1.7–7.7)
Neutrophils Relative %: 74 %
Platelets: 94 10*3/uL — ABNORMAL LOW (ref 150–400)
RBC: 3.26 MIL/uL — ABNORMAL LOW (ref 3.87–5.11)
RDW: 22 % — ABNORMAL HIGH (ref 11.5–15.5)
Smear Review: NORMAL
WBC: 5.7 10*3/uL (ref 4.0–10.5)
nRBC: 0.4 % — ABNORMAL HIGH (ref 0.0–0.2)

## 2021-01-14 LAB — DIGOXIN LEVEL
Digoxin Level: 6.1 ng/mL (ref 0.8–2.0)
Digoxin Level: 6.4 ng/mL (ref 0.8–2.0)

## 2021-01-14 LAB — COMPREHENSIVE METABOLIC PANEL
ALT: 7 U/L (ref 0–44)
AST: 10 U/L — ABNORMAL LOW (ref 15–41)
Albumin: 1.6 g/dL — ABNORMAL LOW (ref 3.5–5.0)
Alkaline Phosphatase: 459 U/L — ABNORMAL HIGH (ref 38–126)
Anion gap: 12 (ref 5–15)
BUN: 38 mg/dL — ABNORMAL HIGH (ref 8–23)
CO2: 22 mmol/L (ref 22–32)
Calcium: 7.6 mg/dL — ABNORMAL LOW (ref 8.9–10.3)
Chloride: 97 mmol/L — ABNORMAL LOW (ref 98–111)
Creatinine, Ser: 2.17 mg/dL — ABNORMAL HIGH (ref 0.44–1.00)
GFR, Estimated: 24 mL/min — ABNORMAL LOW (ref >60)
Glucose, Bld: 78 mg/dL (ref 70–99)
Potassium: 3.6 mmol/L (ref 3.5–5.1)
Sodium: 131 mmol/L — ABNORMAL LOW (ref 135–145)
Total Bilirubin: 0.9 mg/dL (ref 0.3–1.2)
Total Protein: 4.1 g/dL — ABNORMAL LOW (ref 6.5–8.1)

## 2021-01-14 LAB — GLUCOSE, CAPILLARY
Glucose-Capillary: 57 mg/dL — ABNORMAL LOW (ref 70–99)
Glucose-Capillary: 60 mg/dL — ABNORMAL LOW (ref 70–99)
Glucose-Capillary: 63 mg/dL — ABNORMAL LOW (ref 70–99)
Glucose-Capillary: 73 mg/dL (ref 70–99)
Glucose-Capillary: 75 mg/dL (ref 70–99)
Glucose-Capillary: 84 mg/dL (ref 70–99)

## 2021-01-14 LAB — CULTURE, BLOOD (ROUTINE X 2)
Culture: NO GROWTH
Culture: NO GROWTH
Special Requests: ADEQUATE

## 2021-01-14 LAB — MAGNESIUM: Magnesium: 2.5 mg/dL — ABNORMAL HIGH (ref 1.7–2.4)

## 2021-01-14 LAB — PHOSPHORUS: Phosphorus: 4.3 mg/dL (ref 2.5–4.6)

## 2021-01-14 LAB — POTASSIUM: Potassium: 3.3 mmol/L — ABNORMAL LOW (ref 3.5–5.1)

## 2021-01-14 IMAGING — CT CT CHEST-ABD-PELV W/O CM
2 of 4 series · 13 of 36 positions shown, 15 images · non-contrast
Comparison: Chest and abdominal film from earlier in the same day
as well as CT from [DATE] and [DATE].

CLINICAL DATA: C difficile colitis and findings suspicious for
pneumonia.

EXAM:
CT CHEST, ABDOMEN AND PELVIS WITHOUT CONTRAST
TECHNIQUE: Multidetector CT imaging of the chest, abdomen and pelvis was
performed following the standard protocol without IV contrast.

[Series 2: cap wo st · axial · 0.93mm/px · z∈[-1185,-620]mm · 10 of 133 slices shown, 12 images]
[im 10/133  mediastinal]
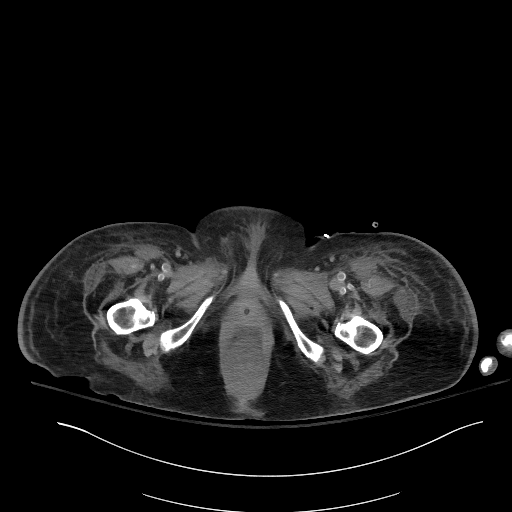
[im 10/133  bone]
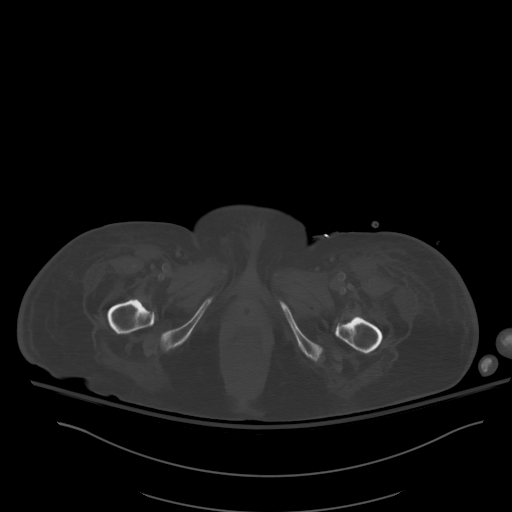
[im 19/133  mediastinal]
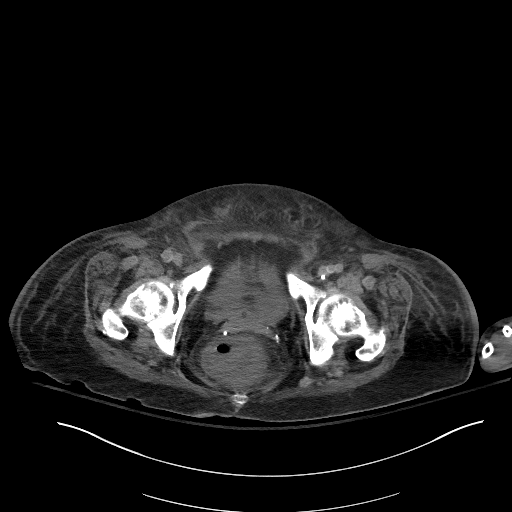
[im 38/133  mediastinal]
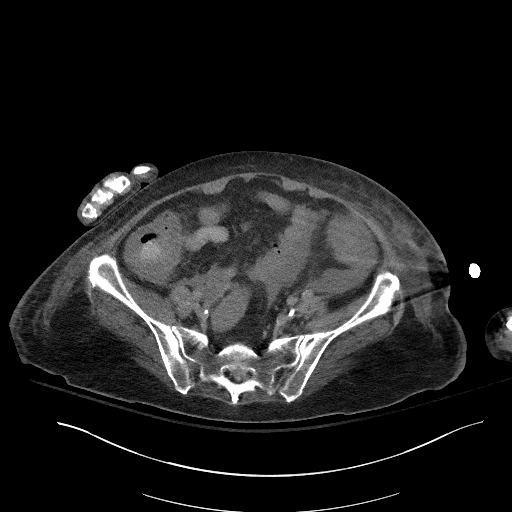
[im 48/133  mediastinal]
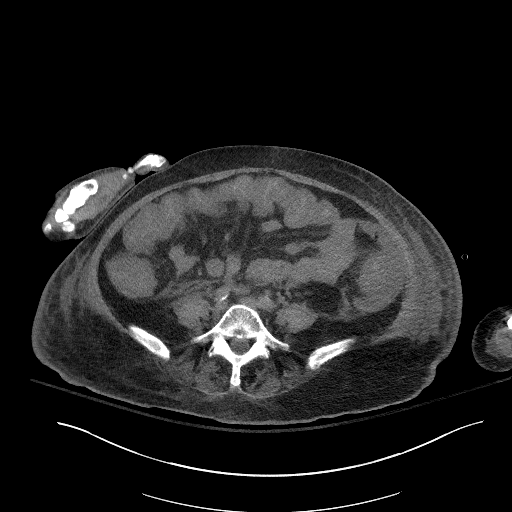
[im 57/133  mediastinal]
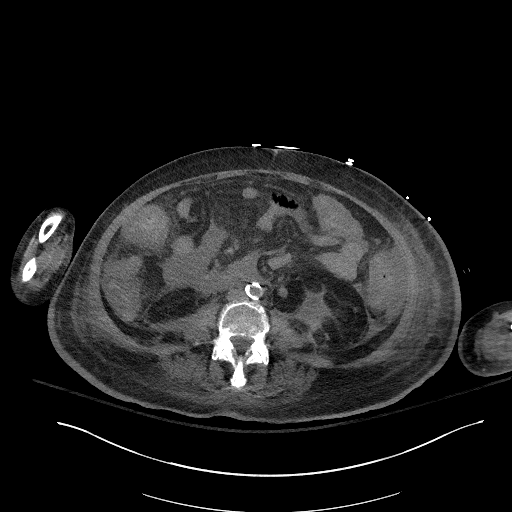
[im 76/133  mediastinal]
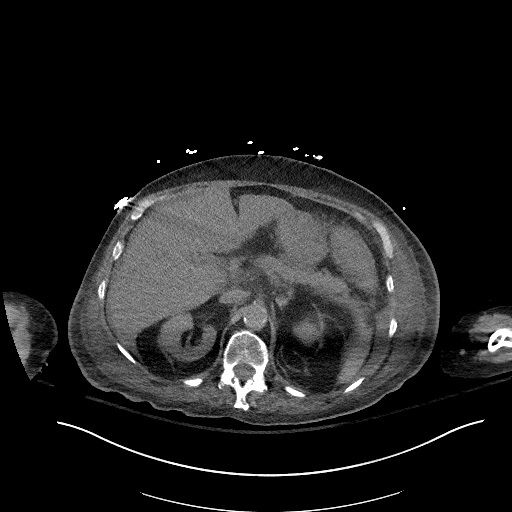
[im 85/133  mediastinal]
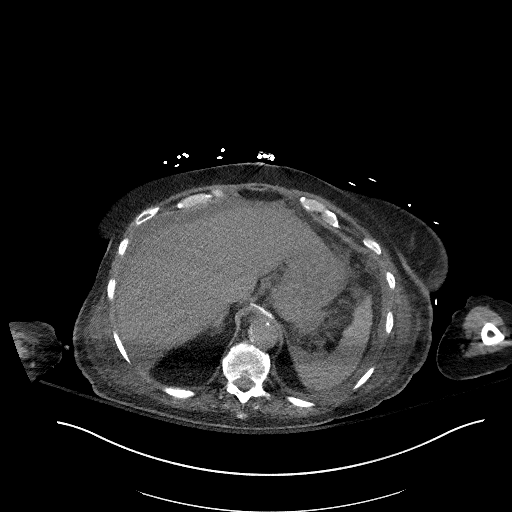
[im 95/133  mediastinal]
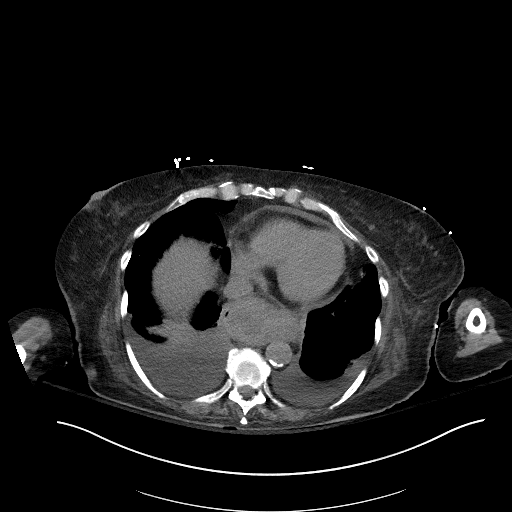
[im 114/133  mediastinal]
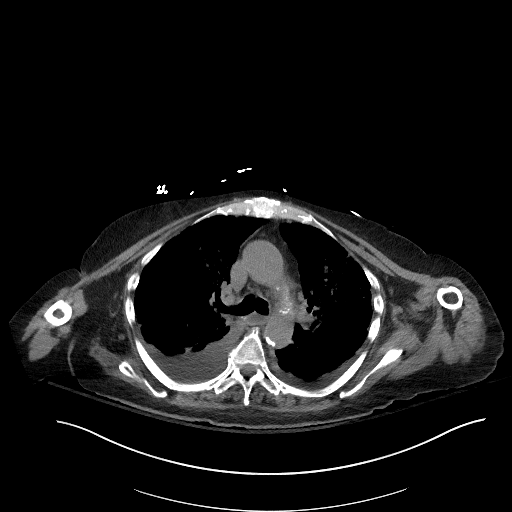
[im 114/133  bone]
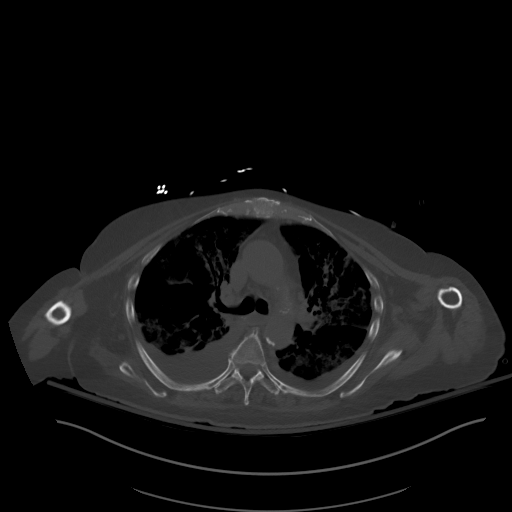
[im 123/133  mediastinal]
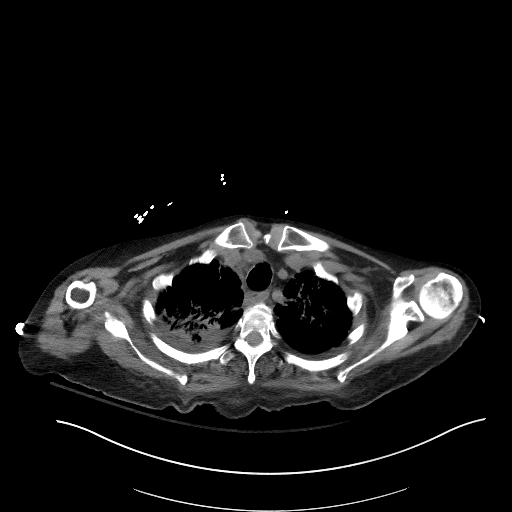

[Series 5: coronal · coronal · 0.85mm/px · 3 of 137 slices shown]
[im 28/137  mediastinal]
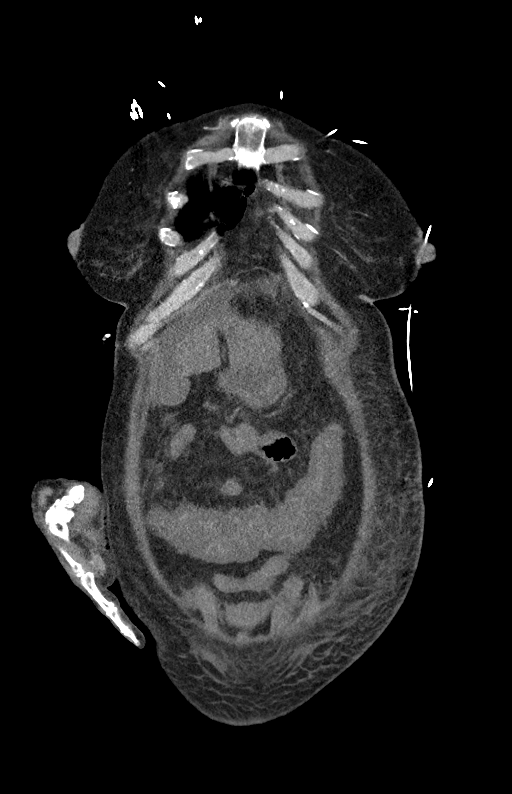
[im 55/137  mediastinal]
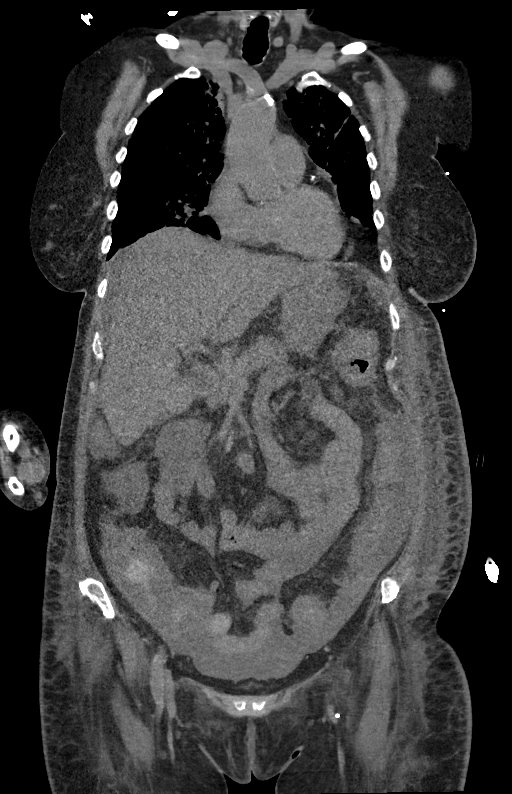
[im 82/137  mediastinal]
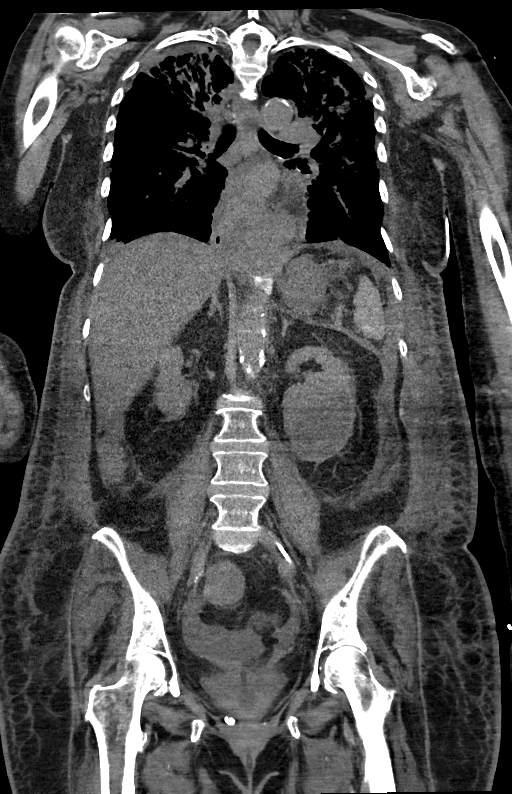

[13 of 36 positions shown; findings below may reference images not displayed]

FINDINGS: CT CHEST FINDINGS

Cardiovascular: Somewhat limited due to lack of IV contrast. Aortic
calcifications are noted without aneurysmal dilatation. No cardiac
enlargement is seen. Coronary calcifications are noted. Minimal
pericardial effusion is noted slightly increased from the prior
exam.

Mediastinum/Nodes: Thoracic inlet is within normal limits. No
sizable hilar or mediastinal adenopathy is noted. The esophagus is
well visualized and there is a moderate-sized sliding-type hiatal
hernia identified.

Lungs/Pleura: Lungs demonstrate patchy airspace opacity throughout
both lungs with evidence of bronchiectatic changes throughout both
lungs. These have worsened significantly in the interval from the
prior exam from [DATE]. Previously seen nodular changes are
not well appreciated due to the overlying infiltrates. Effusions
right considerably greater than left are seen as well. No
pneumothorax is noted.

Musculoskeletal: No acute rib abnormality is noted. Degenerative
changes of the thoracic spine are noted.

CT ABDOMEN PELVIS FINDINGS

Hepatobiliary: Liver shows no discrete mass lesion. Vicarious
excretion of contrast is noted within the gallbladder from prior
exams. Mild perihepatic ascites is noted.

Pancreas: Unremarkable. No pancreatic ductal dilatation or
surrounding inflammatory changes.

Spleen: Normal in size without focal abnormality.

Adrenals/Urinary Tract: Adrenal glands are within normal limits.
Right kidney shows no renal calculi or obstructive changes. Left
kidney demonstrates a 4 mm lower pole stones stable from the prior
exam. No obstructive changes are seen. There is a fluid collection
in the noted along the posterior aspect of the left kidney measuring
5.1 cm in greatest dimension and predominately stable from the prior
exam. This may simply represent a focal cyst although the
possibility of sub capsular fluid collection deserves consideration
although the overall appearance is stable. Bladder is decompressed
by Foley catheter.

Stomach/Bowel: Colon shows diffuse wall thickening consistent with
the given clinical history of C difficile colitis. No obstructive
changes are noted. No findings to suggest perforation are seen.
Small bowel and stomach appear within normal limits aside from the
previously mentioned hiatal hernia.

Vascular/Lymphatic: Aortic atherosclerosis. No enlarged abdominal or
pelvic lymph nodes. Right femoral venous line is noted.

Reproductive: Status post hysterectomy. Right adnexal cyst is noted
stable in appearance from the recent exam.

Other: Ascites is noted throughout the abdomen increased from the
recent examination of 6 days previous. This may simply be reactive
in nature.

Musculoskeletal: No acute or significant osseous findings.
IMPRESSION: CT of the chest: Bronchiectatic changes with diffuse bilateral
multifocal infiltrates consistent with multifocal pneumonia.
Bilateral pleural effusions right greater than left are seen.

CT of the abdomen and pelvis: Increase in ascites when compared with
the recent exam although this may simply be reactive in nature given
the changes in the colon.

Diffuse wall thickening is noted throughout the colon consistent
with the given clinical history of colitis. No perforation is
identified.

Hiatal hernia.

Subcapsular fluid collection along the posterior aspect of the left
kidney stable in appearance from the recent exam. Possibility of
perinephric abscess could not be totally excluded.

Stable 3.9 cm right adnexal cyst Recommend follow-up US in 6-12
months. Note: This recommendation does not apply to premenarchal
patients and to those with increased risk (genetic, family history,
elevated tumor markers or other high-risk factors) of ovarian
cancer. Reference: JACR [DATE]):[id number]

## 2021-01-14 IMAGING — DX DG ABD PORTABLE 2V
3 series · 3 of 3 positions shown · non-contrast
Comparison: [DATE], abdominal CT [DATE]

CLINICAL DATA: 70-year-old female with colitis

EXAM:
PORTABLE ABDOMEN - 2 VIEW

[abdomen erect]
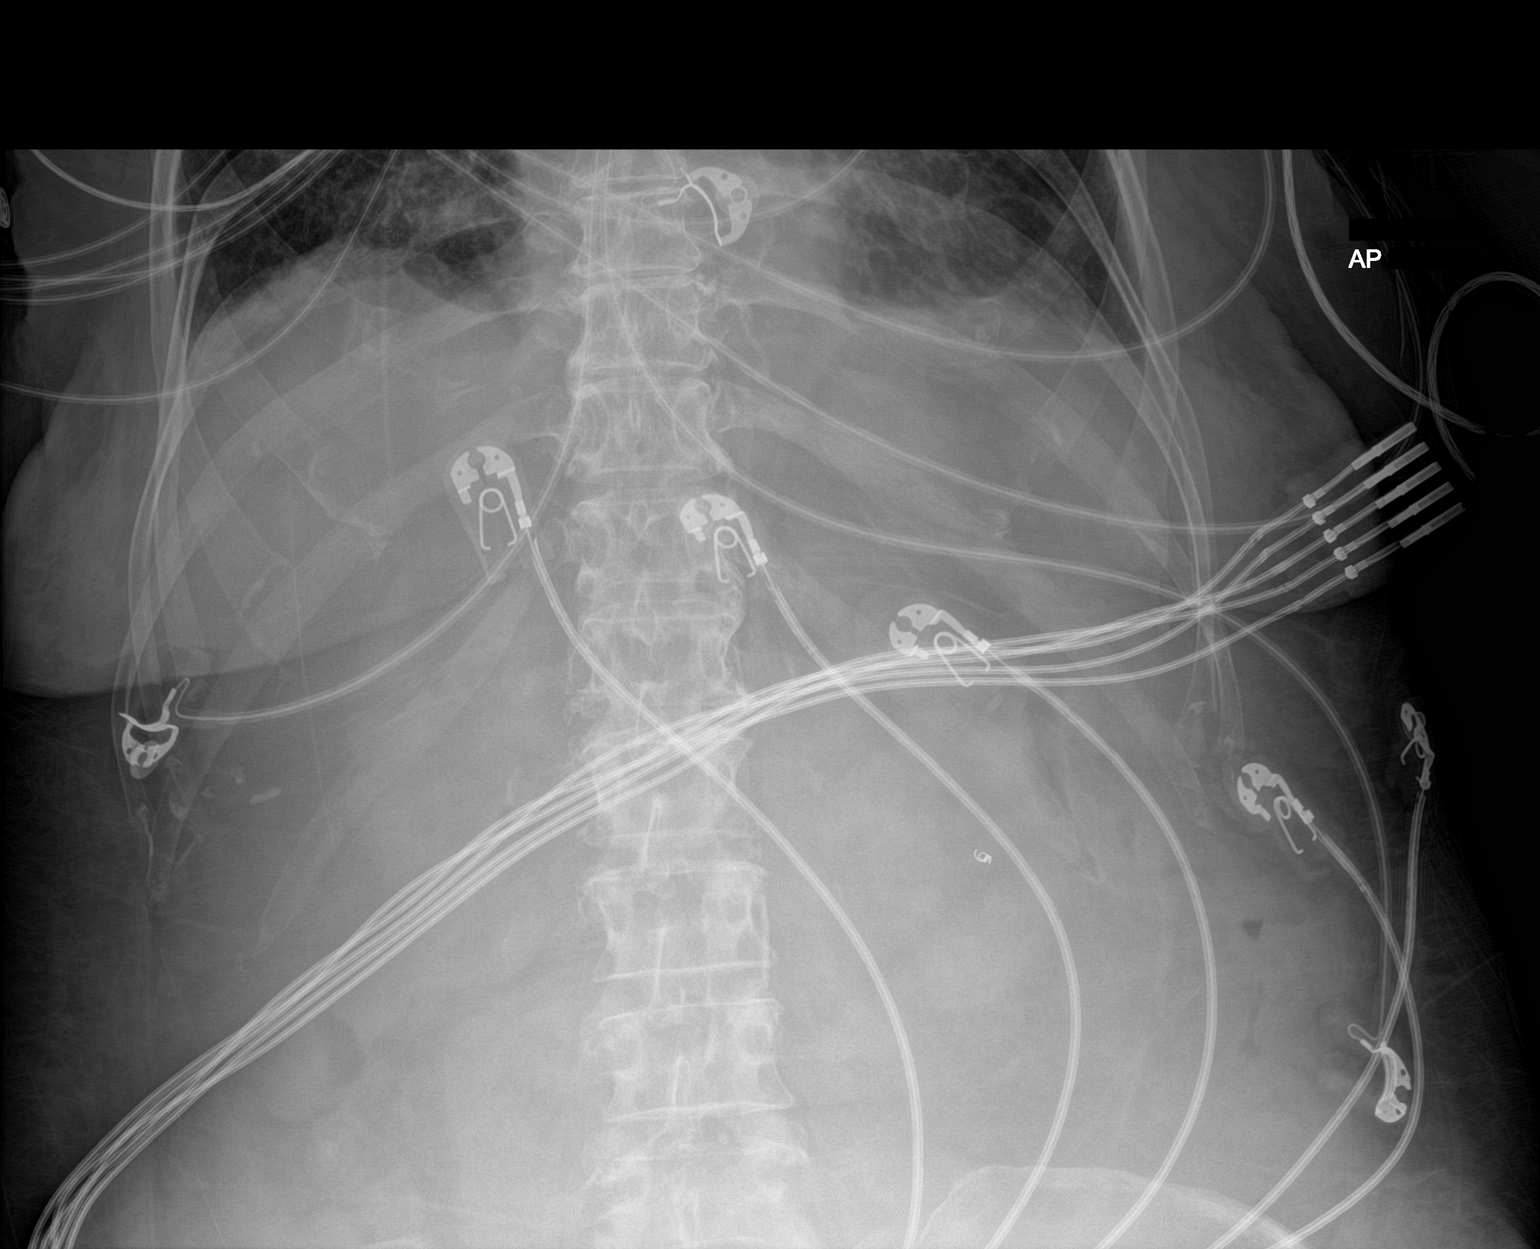

[abdomen supine (1 of 2)]
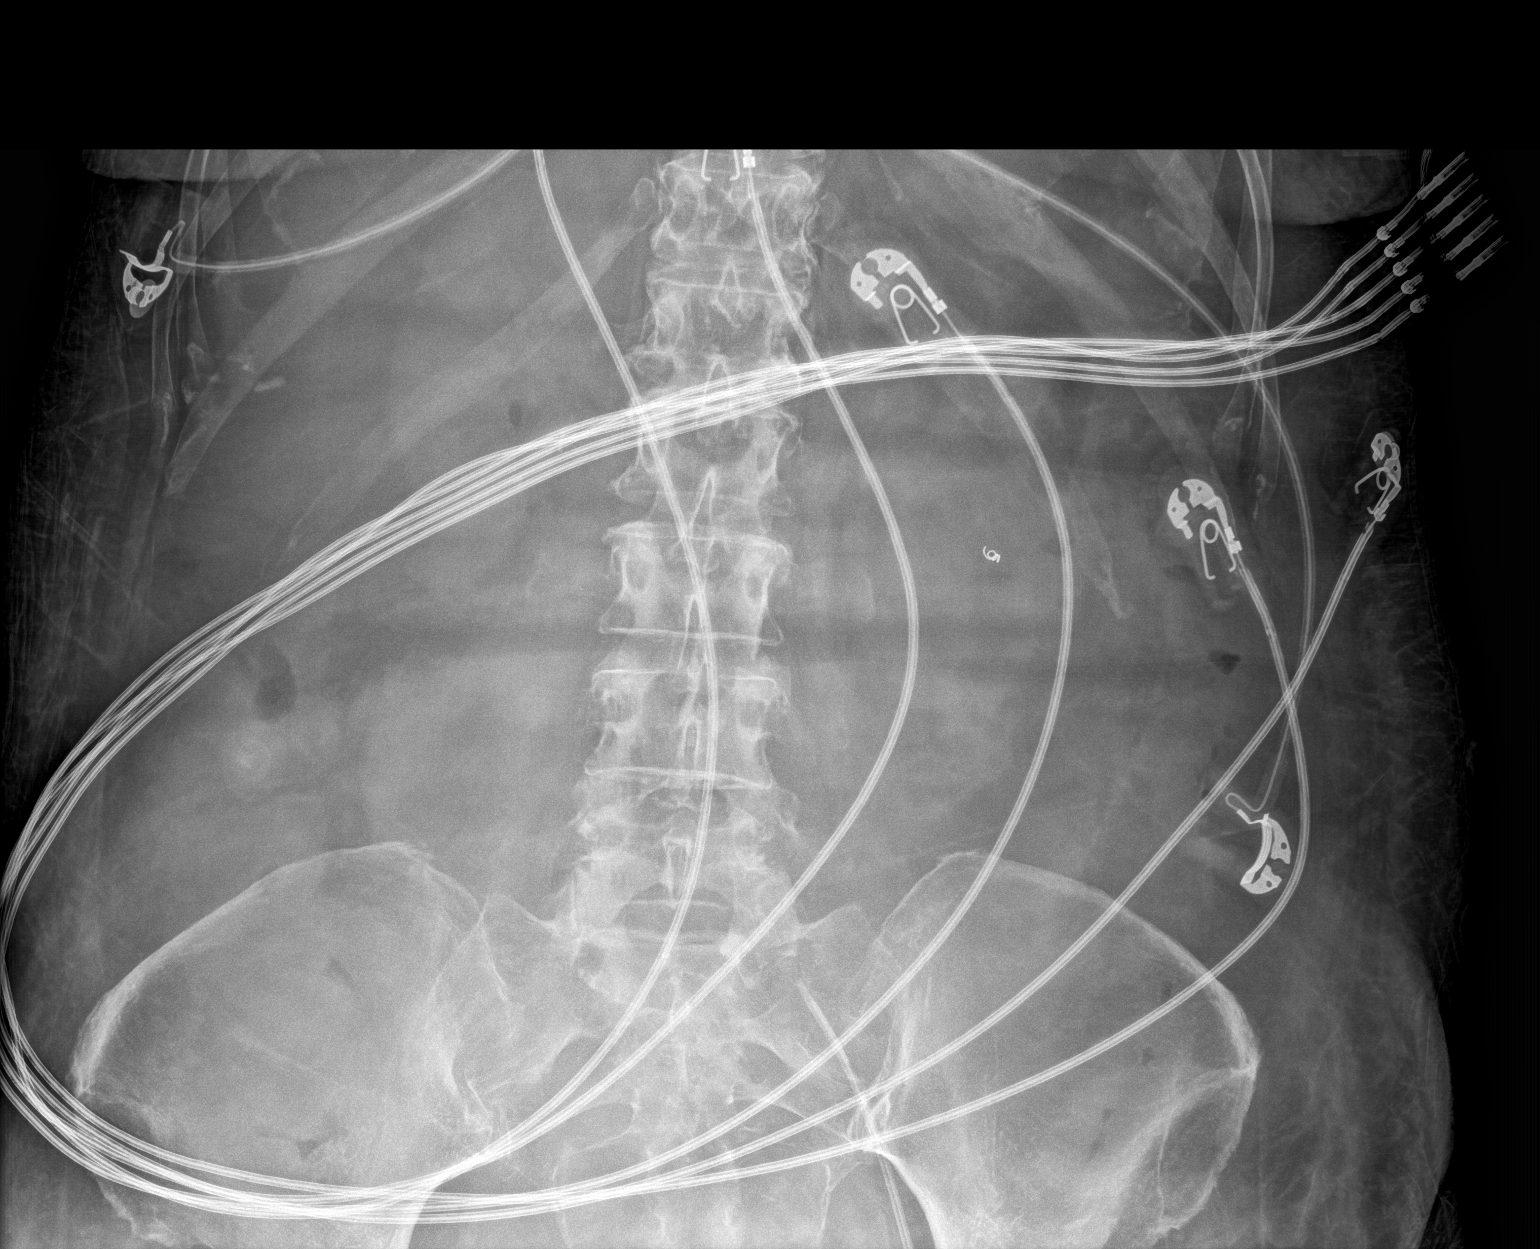

[abdomen supine (2 of 2)]
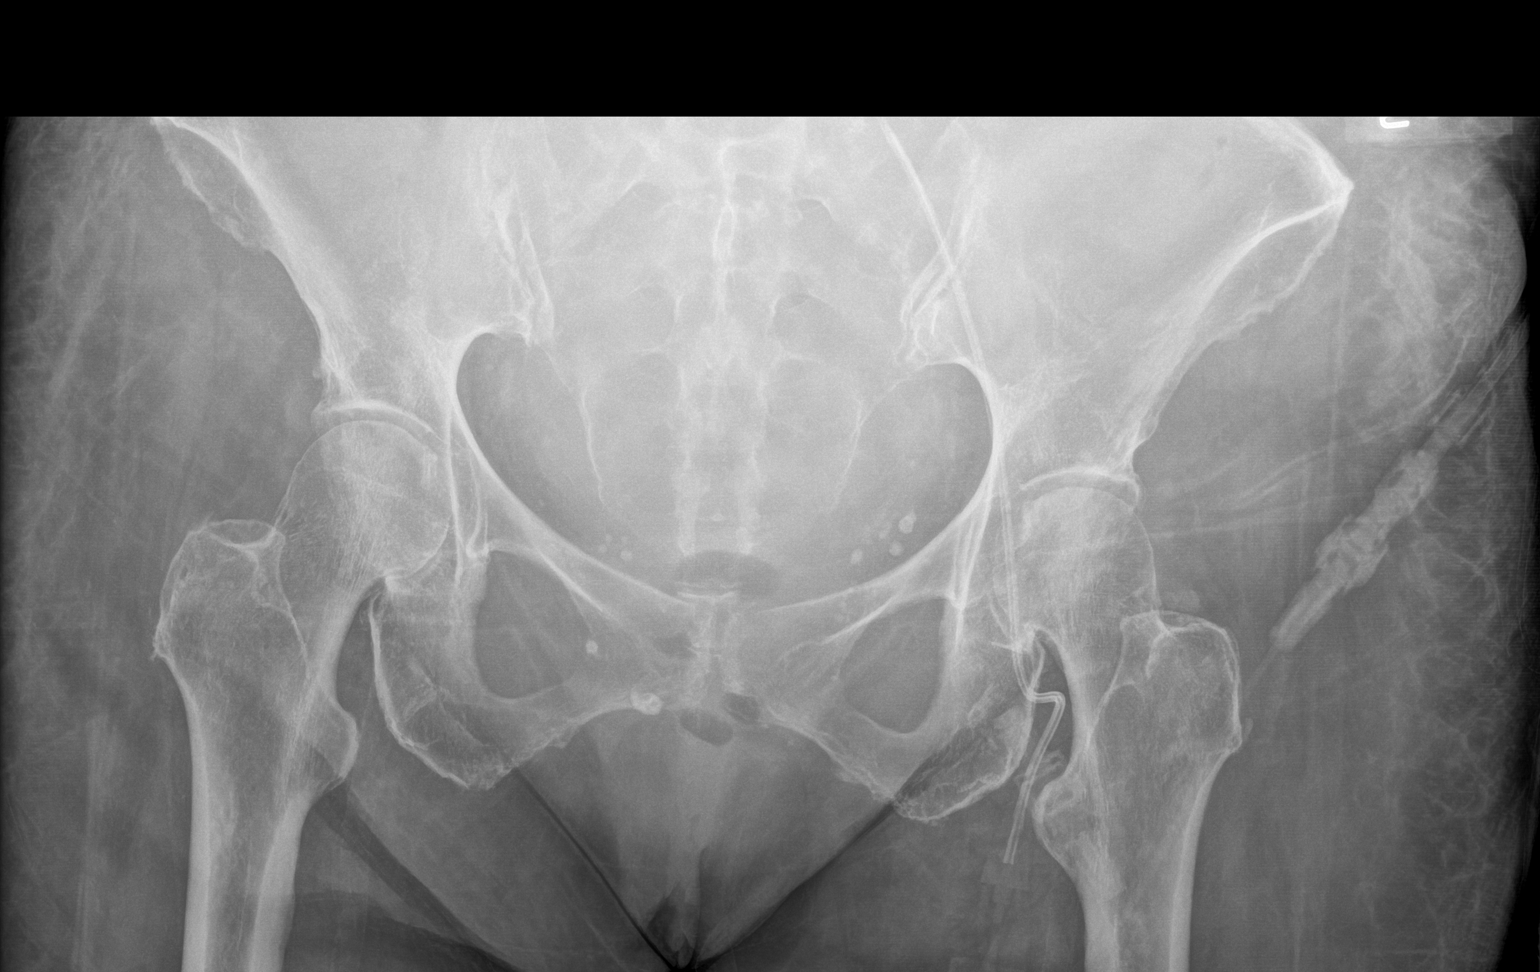

[3 of 3 positions shown; findings below may reference images not displayed]

FINDINGS: Overlying EKG leads somewhat limit the evaluation.

Relative paucity of bowel gas, with paucity of gas in the stomach
small bowel and colon. Trace gas present within left and right
colon. No abnormal distension or air-fluid level.

No unexpected radiopaque foreign body. Vascular calcifications
present. Metallic coils project over the left renal silhouette
similar to the prior CT.

Left femoral vascular line.

Degenerative changes of the spine, hips, bilateral sacroiliac
joints. No acute displaced fracture.
IMPRESSION: Nonspecific bowel gas pattern, with no definite evidence of
obstruction.

Left femoral vascular line

## 2021-01-14 IMAGING — DX DG CHEST 1V PORT
1 series · 1 of 1 positions shown · non-contrast
Comparison: [DATE]

CLINICAL DATA: Worsening hypoxia.  Pancolitis.

EXAM:
PORTABLE CHEST 1 VIEW

[chest ap]
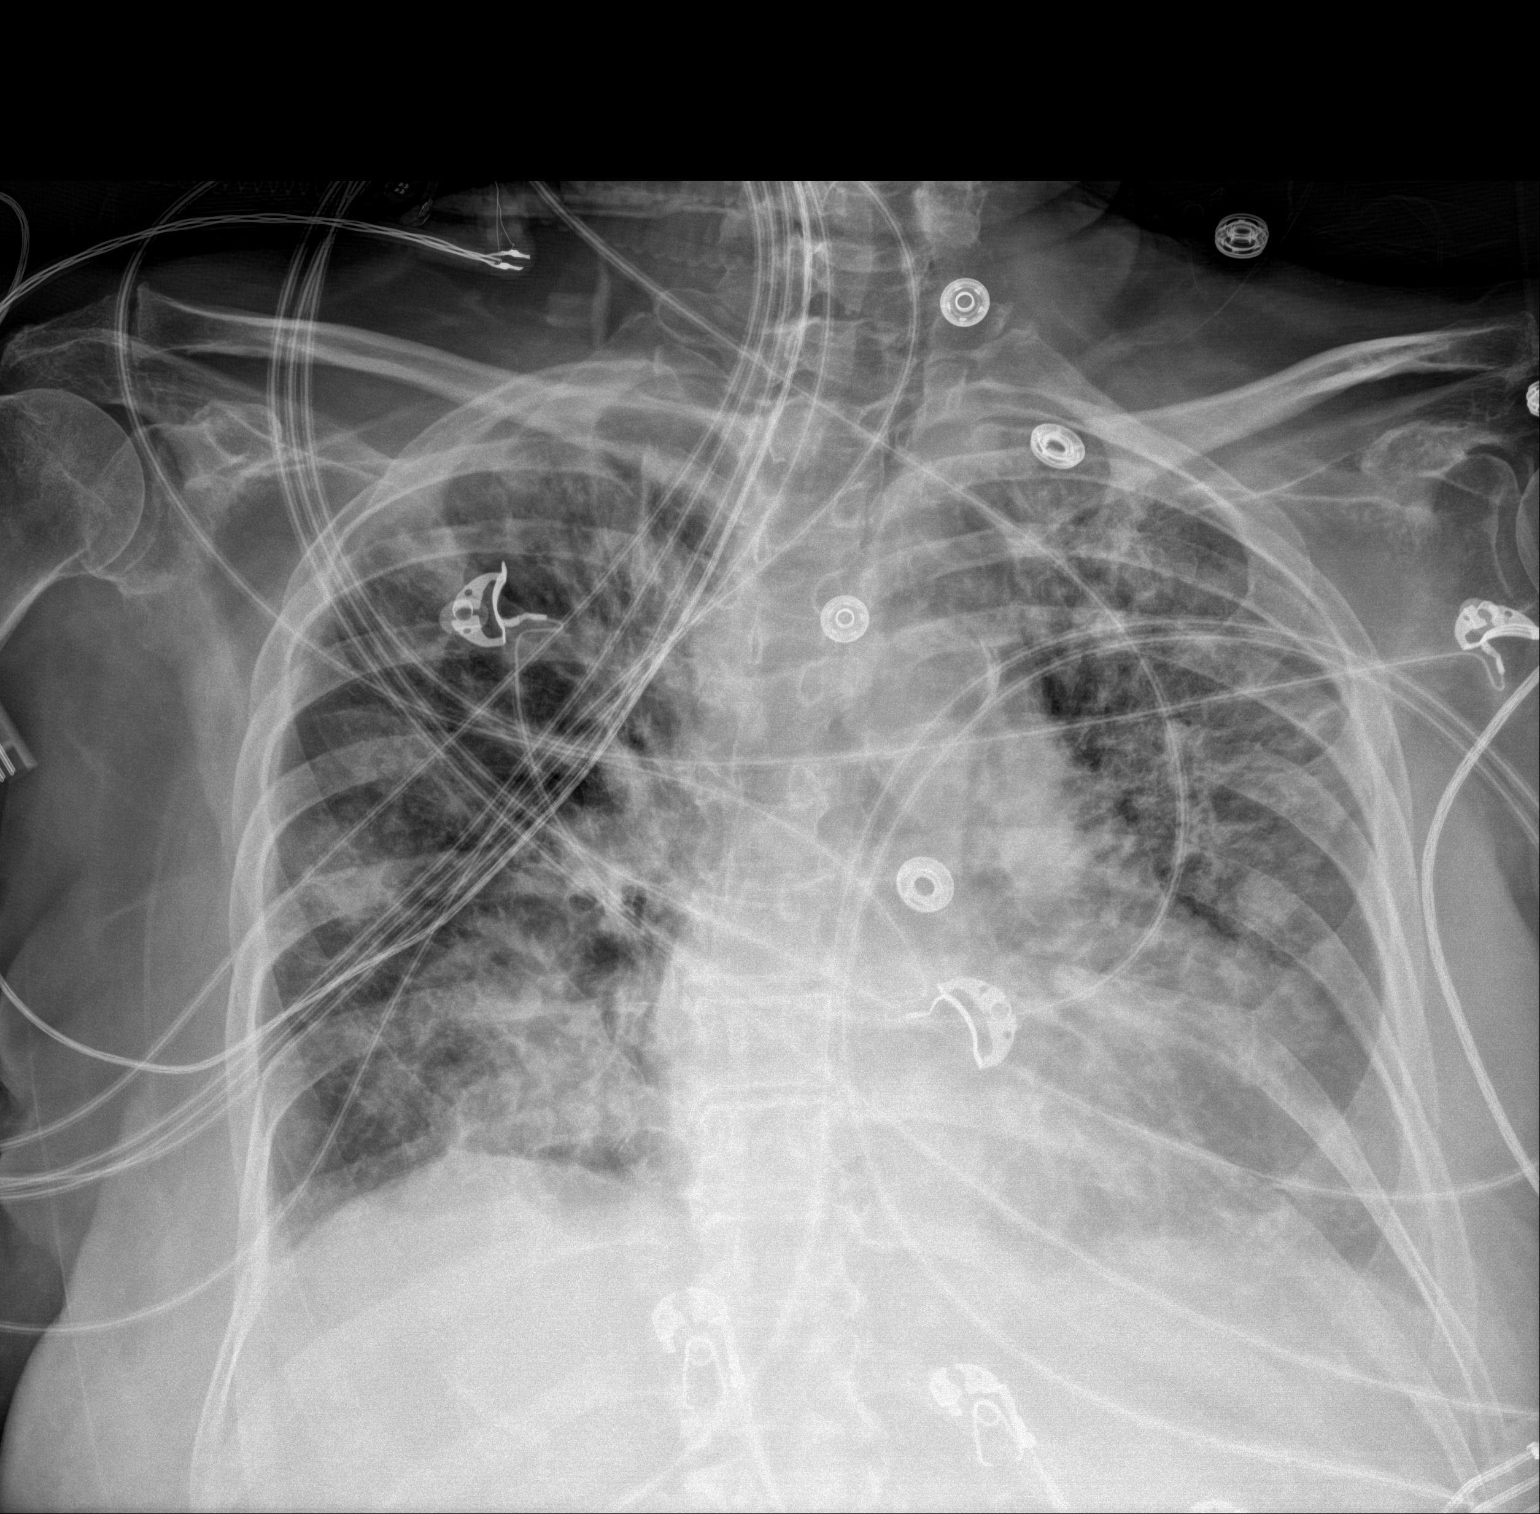

[1 of 1 positions shown; findings below may reference images not displayed]

FINDINGS: Heart size remains within normal limits. Aortic atherosclerotic
calcification noted.

Increased heterogeneous airspace disease is seen bilaterally.
Multiple ill-defined nodular opacities are also noted, which appear
new. This raises possibility of septic emboli. No evidence of
pleural effusion.
IMPRESSION: Increased bilateral heterogeneous airspace disease with ill-defined
nodular opacities. This raises possibility of septic emboli.
Consider chest CTA for further evaluation.

These results will be called to the ordering clinician or
representative by the Radiologist Assistant, and communication
documented in the PACS or [REDACTED].

## 2021-01-14 MED ORDER — ZOLPIDEM TARTRATE 5 MG PO TABS
5.0000 mg | ORAL_TABLET | Freq: Every day | ORAL | Status: DC
  Administered 2021-01-14: 5 mg via ORAL
  Filled 2021-01-14: qty 1

## 2021-01-14 MED ORDER — ALBUMIN HUMAN 25 % IV SOLN
25.0000 g | Freq: Four times a day (QID) | INTRAVENOUS | Status: AC
  Administered 2021-01-14 – 2021-01-15 (×4): 25 g via INTRAVENOUS
  Filled 2021-01-14 (×4): qty 100

## 2021-01-14 MED ORDER — POTASSIUM CHLORIDE CRYS ER 20 MEQ PO TBCR
40.0000 meq | EXTENDED_RELEASE_TABLET | Freq: Once | ORAL | Status: AC
  Administered 2021-01-14: 40 meq via ORAL
  Filled 2021-01-14: qty 2

## 2021-01-14 MED ORDER — DIGOXIN 0.25 MG/ML IJ SOLN
0.2500 mg | Freq: Once | INTRAMUSCULAR | Status: AC
  Administered 2021-01-14: 0.25 mg via INTRAVENOUS
  Filled 2021-01-14: qty 2

## 2021-01-14 MED ORDER — POTASSIUM CHLORIDE 20 MEQ PO PACK
40.0000 meq | PACK | Freq: Once | ORAL | Status: DC
  Filled 2021-01-14: qty 2

## 2021-01-14 MED ORDER — DIGOXIN 0.25 MG/ML IJ SOLN: 0.2500 mg | Freq: Once | INTRAMUSCULAR | Status: DC

## 2021-01-14 MED ORDER — DIGOXIN 0.25 MG/ML IJ SOLN: 0.2500 mg | Freq: Every day | INTRAMUSCULAR | Status: DC

## 2021-01-14 NOTE — Progress Notes (Signed)
PHARMACY CONSULT NOTE  Pharmacy Consult for Electrolyte Monitoring and Replacement   Recent Labs: Potassium (mmol/L)  Date Value  01/14/2021 3.6   Magnesium (mg/dL)  Date Value  21/22/4825 2.5 (H)   Calcium (mg/dL)  Date Value  00/37/0488 7.6 (L)   Albumin (g/dL)  Date Value  89/16/9450 1.6 (L)   Phosphorus (mg/dL)  Date Value  38/88/2800 4.3   Sodium (mmol/L)  Date Value  01/14/2021 131 (L)   Assessment: 69 y.o. female who presents emergency department for generalized fatigue/weakness. Pharmacy has been consulted for electrolyte replacement.   Goal of Therapy:  K 4 - 5.1 mmol/L Mg 2 - 2.4 mg/dL All other electrolytes within normal limits  Plan:  Hyponatremia stable / slightly improved Worsening Scr noted No electrolyte replacement indicated at this time Will continue to follow along  [name] 01/14/2021 7:26 AM

## 2021-01-14 NOTE — Evaluation (Signed)
Occupational Therapy Evaluation Patient Details Name: Lauren Bradshaw MRN: [id number] DOB: 03-04-1951 Today's Date: 01/14/2021   History of Present Illness Pt is a 70 year old female with a history of atrial fibrillation, hypertension, hyperlipidemia, vasculitis who presents to the emergency department with generalized weakness and was subsequently discovered to be in atrial fibrillation with RVR in the setting of sepsis due to c diff, and multisystem organ failure. PMH: a-fib, c diff, GERD, HLD, HTN, seizure, thyroid disease, vasculitis, insomnia   Clinical Impression   Ms Sangiovanni was seen for OT/PT co-evaluation this date. Prior to hospital admission, pt was MOD I for mobility and ADLs using slide board for transfers. Pt lives with son available 24/7. Pt presents to acute OT demonstrating impaired ADL performance and functional mobility 2/2 decreased activity tolerance and functional strength/balance deficits. Pt currently requires MAX A don B socks seated EOB. Pt tolerated ~5 min static sitting EOB c BUE support - unable to progress to dynamic grooming tasks. MAX A for toileting at bed level. Pt would benefit from skilled OT to address noted impairments and functional limitations (see below for any additional details) in order to maximize safety and independence while minimizing falls risk and caregiver burden. Upon hospital discharge, recommend AIR to maximize pt safety and return to PLOF.     Recommendations for follow up therapy are one component of a multi-disciplinary discharge planning process, led by the attending physician.  Recommendations may be updated based on patient status, additional functional criteria and insurance authorization.   Follow Up Recommendations  Acute inpatient rehab (3hours/day)    Assistance Recommended at Discharge Intermittent Supervision/Assistance  Functional Status Assessment  Patient has had a recent decline in their functional status and demonstrates the ability  to make significant improvements in function in a reasonable and predictable amount of time.  Equipment Recommendations  Other (comment) (defer to next venue of care)    Recommendations for Other Services       Precautions / Restrictions Precautions Precautions: Fall Restrictions Weight Bearing Restrictions: No      Mobility Bed Mobility Overal bed mobility: Needs Assistance Bed Mobility: Rolling;Supine to Sit;Sit to Supine Rolling: Mod assist   Supine to sit: Max assist;+2 for physical assistance;HOB elevated Sit to supine: Max assist;+2 for physical assistance;HOB elevated   General bed mobility comments: Pt requires use of bed rails, HOB elevated & +2 assist for supine<>sit.    Transfers                   General transfer comment: deferred 2/2 pt fatigue, poor sitting tolerance, and HR 155 in sitting      Balance Overall balance assessment: Needs assistance Sitting-balance support: Feet unsupported;Bilateral upper extremity supported Sitting balance-Leahy Scale: Fair Sitting balance - Comments: close supervision static sitting     Standing balance-Leahy Scale:  (not safe to attempt)                             ADL either performed or assessed with clinical judgement   ADL Overall ADL's : Needs assistance/impaired                                       General ADL Comments: MAX A don B socks seated EOB. Pt tolerated ~5 min static sitting EOB c BUE support - unable to progress to dynamic grooming tasks. MAX A for  toileting at bed level      Pertinent Vitals/Pain Pain Assessment: No/denies pain     Hand Dominance     Extremity/Trunk Assessment Upper Extremity Assessment Upper Extremity Assessment: Generalized weakness   Lower Extremity Assessment Lower Extremity Assessment: Generalized weakness   Cervical / Trunk Assessment Cervical / Trunk Assessment:  (while sitting EOB pt sits with downward head)   Communication  Communication Communication: No difficulties   Cognition Arousal/Alertness: Awake/alert Behavior During Therapy: Flat affect Overall Cognitive Status: Within Functional Limits for tasks assessed                                 General Comments: Requires encouragement for participation     General Comments       Exercises Exercises: Other exercises Other Exercises Other Exercises: Pt educated re: OT role, DME recs, d/c recs, falls prevention, ECS Other Exercises: LBD, toileing, sup<>sit, sitting balance/tolerance, rolling   Shoulder Instructions      Home Living Family/patient expects to be discharged to:: Private residence Living Arrangements: Children Available Help at Discharge: Family;Available 24 hours/day Type of Home: House Home Access: Ramped entrance                     Home Equipment: Wheelchair - manual;BSC          Prior Functioning/Environment Prior Level of Function : Needs assist       Physical Assist : Mobility (physical);ADLs (physical)     Mobility Comments: Pt recently discharged from [facility] inpatient rehab & was able to perform slide board transfers independently prior to this admission ADLs Comments: MOD I for dressing/grooming sitting        OT Problem List: Decreased strength;Decreased range of motion;Decreased activity tolerance;Impaired balance (sitting and/or standing)      OT Treatment/Interventions: Self-care/ADL training;Therapeutic exercise;Energy conservation;DME and/or AE instruction;Therapeutic activities;Patient/family education;Balance training    OT Goals(Current goals can be found in the care plan section) Acute Rehab OT Goals Patient Stated Goal: to return to PLOF OT Goal Formulation: With patient Time For Goal Achievement: 01/28/21 Potential to Achieve Goals: Good ADL Goals Pt Will Perform Grooming: with supervision;with set-up;sitting (pt will tolerate >8 mins grooming tasks) Pt Will Transfer to  Toilet: with mod assist;with transfer board;bedside commode Pt Will Perform Toileting - Clothing Manipulation and hygiene: with min assist;bed level  OT Frequency: Min 3X/week   Barriers to D/C:            Co-evaluation PT/OT/SLP Co-Evaluation/Treatment: Yes Reason for Co-Treatment: Complexity of the patient's impairments (multi-system involvement);Necessary to address cognition/behavior during functional activity;For patient/therapist safety;To address functional/ADL transfers PT goals addressed during session: Mobility/safety with mobility;Balance OT goals addressed during session: ADL's and self-care;Strengthening/ROM      AM-PAC OT "6 Clicks" Daily Activity     Outcome Measure Help from another person eating meals?: A Little Help from another person taking care of personal grooming?: A Lot Help from another person toileting, which includes using toliet, bedpan, or urinal?: A Lot Help from another person bathing (including washing, rinsing, drying)?: A Lot Help from another person to put on and taking off regular upper body clothing?: A Little Help from another person to put on and taking off regular lower body clothing?: A Lot 6 Click Score: 14   End of Session Equipment Utilized During Treatment: Oxygen ([place] 45L) Nurse Communication: Mobility status  Activity Tolerance: Patient tolerated treatment well Patient left: in bed;with call  bell/phone within reach;with bed alarm set  OT Visit Diagnosis: Other abnormalities of gait and mobility (R26.89);Muscle weakness (generalized) (M62.81)                Time: [id number] OT Time Calculation (min): 21 min Charges:  OT General Charges $OT Visit: 1 Visit OT Evaluation $OT Eval High Complexity: 1 High  Dessie Coma, M.S. OTR/L  01/14/21, 11:22 AM  ascom [phone]

## 2021-01-14 NOTE — Progress Notes (Signed)
NAME:  Lauren Bradshaw, MRN:  [id number], DOB:  09-Nov-1950, LOS: 0 ADMISSION DATE:  12/10/2020 CONSULTATION DATE:  12/21/2020             REFERRING MD:  Dr. Kerman Passey CHIEF COMPLAINT:  Weakness, lethargy and abdominal pain     BRIEF SYNOPSIS:  Lauren Bradshaw is a 70 y/o F who presented to [facility] ED for weakness, lethargy, diarrhea, and abdominal pain after recent d/c from rehab for c.diff, Afib, PNA, and ANCA renal vasculitis. Patient placed in ICU care for hemodynamic instability secondary to hypovolemic septic shock d/t pancolitis with multiorgan failure with resp distress, renal failure, cardiac failure with afib, severe leukopenia with severe metabolic acidosis and encephalopathy.   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 Diff. (She was meant to FU with both renal and pulmonary specialists this week to determine further plan of care.)     Pertinent  Medical History  Atrial fibrillation  C. Diff HTN HDL ANCA renal vasculitis  PNA w/ respiratory failure  Hypothyroidism    Significant Hospital Events: Including procedures, antibiotic start and stop dates in addition to other pertinent events   ED course: Code stroke activated, CTH obtained, but further testing deferred d/t hemodynamic instability. Patient was given amiodarone bolus for AFIB w/ RVR.    10/31>> The critical care team was consulted for hemodynamic instability. Patient was seen in ED bed, appears to be critically ill - complaining of abdominal pain, CT abdomen/pelvis ordered. Will be transferred to critical care for further workup and treatment.  11/1>> CT abdomen/pelvis confirms pancolitis. Surgery on board for possible colectomy  with end iliostomy if worsening condition or increasing lactate. Continue treatment for sepsis and supportive care.  11/2>> Continue to trend lactate. Continue antibiotic treatment, pressure support as needed. No fever overnight. Increasing WBCs and neutrophils. Kidney function stable.  11/3>> Patient feeling better. Abdominal tenderness improved. Kidney function slightly worse. Continue abx treatment to avoid surgery. Increasing WBC count.  11/4>> Developed SOB and hypoxia overnight requiring 15L Hi-Flo O2. Suspected pulmonary edema d/t fluid overload in setting of impaired kidney function. Diuretics given.  CXR shows progressive worsening of b/l infiltrates(11/4) 11/4 US guided thoracentesis-545ml's removed, tried esmolol, tried digoxin, HR 130's 11/5 remains on in afib, resp distress, hypoxia, nephrology consulted  Micro Data:  10/31>> Blood culture pending (no growth 3 days) 10/31>> Stool PCR - positive for C.Diff    Antimicrobials:  10/31>> metronidazole, vancomycin, cefepime (1x dose)  11/1>> metronidazole, PO vancomycin    Interim History / Subjective:  +SOB +hypoxia Alert and awake Nephrology consulted Tried multiple drugs for afib, esmolol, digoxin  Kidney function continues to slowly decline.  Abd tenderness improving LA improving Follow up general surgery recs HR's down to 120's    Objective   Blood pressure (!) 106/57, pulse (!) 124, temperature (!) 97.5 F (36.4 C), temperature source Oral, resp. rate (!) 32, height 5\' 4"  (1.626 m), weight 54.7 kg, SpO2 97 %.    >         Intake/Output Summary (Last 24 hours) at 12/11/2020 1642 Last data filed at 01/08/2021 1639    Gross per 24 hour  Intake 2500.67 ml  Output --  Net 2500.67 ml  Filed Weights    Jul 24, 2020 1013  Weight: 54.7 kg      REVIEW OF SYSTEMS Review of Systems  Constitutional:  Positive for malaise/fatigue.  Respiratory:  Positive for shortness of breath. Negative for cough.    Cardiovascular:  Negative for chest pain.  Gastrointestinal:  Positive for abdominal pain. Negative for nausea and vomiting.  Neurological:  Negative for headaches.  Endo/Heme/Allergies:  Bruises/bleeds easily.    PHYSICAL EXAMINATION:  GENERAL:critically ill appearing, +resp distress EYES: Pupils equal, round, reactive to light.  No scleral icterus.  MOUTH: Moist mucosal membrane.  NECK: Supple.  PULMONARY: +rhonchi,  CARDIOVASCULAR: S1 and S2.  No murmurs  GASTROINTESTINAL: Soft, nontender, -distended. Positive bowel sounds.  MUSCULOSKELETAL: +edema.  NEUROLOGIC: alert and awake, no focal weakness SKIN:intact,warm,dry    Labs/imaging that I havepersonally reviewed  (right click and "Reselect all SmartList Selections" daily)  10/31>> CTH- There is no acute intracranial hemorrhage or evidence of acute infarction. ASPECT score is 10.  10/31 >> CXR- New airspace opacity in the right suprahilar lung likely due to pneumonia. Radiographic follow-up to resolution is recommended to exclude developing mass. 10/31 >> CT abdomen/pelvis - pancolitis. Left renal subscapular fluid collection, likely hematoma d/t recent renal biopsy.  11/1>> CXR- persistent medial right upper lobe airspace consolidation concerning for pneumonia. Followup PA and lateral chest X-ray is recommended in 3-4 weeks following trial of antibiotic therapy to ensure resolution and exclude underlying malignancy. 11/1>> US BLE- No evidence of DVT within either lower extremity. 11/1>> Echo- EF 60-65%, mild LAE (overall normal heart function) 11/2>> KUB - nonobstructive bowel gas pattern. No free intraperitoneal air seen.  11/2>> MR Brain- tiny acute infarct in right frontal lobe. Moderate chronic microvascular ischemic disease.  11/2>> MR T-spine- unremarkable. Mod-sized bilateral pleural effusions.  11/4>> CXR- The appearance the chest is concerning for severely worsening multilobar bilateral pneumonia. Probable trace  bilateral pleural effusions.   Resolved Hospital Problem list       ASSESSMENT AND PLAN   SYNOPSIS: Lauren Bradshaw is a 70 y/o F who [facility] ED for weakness, lethargy, diarrhea, and abdominal pain after recent d/c from rehab for c.diff, Afib, PNA, and ANCA renal vasculitis. Patient placed in ICU care for hemodynamic instability secondary to hypovolemic septic shock d/t pancolitis with multiorgan failure with resp distress, renal failure, cardiac failure with afib, hypoglycemia, severe leukopenia with severe metabolic acidosis and encephalopathy.    SEPTIC shock SOURCE-PANCOLITIS AND C DIFF colitis -use vasopressors to keep MAP>65 as needed -follow ABG and LA as needed -follow up cultures -emperic ABX Continue PO vanc & flagyl Stress-dose Solu-Cortef  Lactate: 6.4-->5.3-->4.2-->2.1-->1.7 -->1.2     GI-ABD pain and C diff Pancolitis GI PROPHYLAXIS as indicated  NUTRITIONAL STATUS DIET-->TF's as tolerated Per surgery - will try to treat pancolitis with aggressive abx to avoid surgery. 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 Will continue to trend lactate, follow fever curve; if continues to elevate or be significantly elevated, will plan for urgent surgery. CLD ordered - Zofran & fentanyl as needed for pain control   Severe ACUTE Hypoxic  Respiratory Failure Possible pulmonary edema vs. persistent PNA vs. Amiodarone lung toxicity Weaning fio2 as needed and tolerated Nebs as prescribed Lasix given 11/4 High risk for intubation US thoracentesis removed 500 ml's   ELECTROLYTES -follow labs as needed -replace as needed -pharmacy consultation and following  ACUTE ANEMIA- TRANSFUSE AS NEEDED CONSIDER TRANSFUSION  IF HGB<7 DVT PRX with TED/SCD's ONLY  CBC    Component Value  Date/Time   WBC 5.7 01/14/2021 0536   RBC 3.26 (L) 01/14/2021 0536   HGB 9.2 (L) 01/14/2021 0536   HCT 26.8 (L) 01/14/2021 0536   PLT 94 (L) 01/14/2021 0536   MCV 82.2 01/14/2021  0536   MCH 28.2 01/14/2021 0536   MCHC 34.3 01/14/2021 0536   RDW 22.0 (H) 01/14/2021 0536   LYMPHSABS 0.3 (L) 01/14/2021 0536   MONOABS 0.3 01/14/2021 0536   EOSABS 0.0 01/14/2021 0536   BASOSABS 0.1 01/14/2021 0536   ACUTE  CARDIAC FAILURE- Afib with RVR Does not tolerate Ca blockers or beta blockers Digoxin tried last night, concerned about renal function Follow up recs pending  ACUTE KIDNEY INJURY/Renal Failure -continue Foley Catheter-assess need -Avoid nephrotoxic agents -Follow urine output, BMP -Ensure adequate renal perfusion, optimize oxygenation -Renal dose medications  Intake/Output Summary (Last 24 hours) at 01/14/2021 0732 Last data filed at 01/13/2021 1800 Gross per 24 hour  Intake 168.25 ml  Output 200 ml  Net -31.75 ml     Intake/Output Summary (Last 24 hours) at 02-07-21 1657 Last data filed at 2021-02-07 1639    Gross per 24 hour  Intake 2500.67 ml  Output --  Net 2500.67 ml   BMP Latest Ref Rng & Units 01/14/2021 01/13/2021 01/13/2021  Glucose 70 - 99 mg/dL 78 - 811(B)  BUN 8 - 23 mg/dL 14(N) - 82(N)  Creatinine 0.44 - 1.00 mg/dL 5.62(Z) - 3.08(M)  Sodium 135 - 145 mmol/L 131(L) - 129(L)  Potassium 3.5 - 5.1 mmol/L 3.6 3.7 3.3(L)  Chloride 98 - 111 mmol/L 97(L) - 93(L)  CO2 22 - 32 mmol/L 22 - 21(L)  Calcium 8.9 - 10.3 mg/dL 7.6(L) - 7.7(L)    Hypothyroidism  - Levothyroxine 75 mcg once a day   Best practice (right click and "Reselect all SmartList Selections" daily)  Diet: NPO Pain/Anxiety/Delirium protocol (if indicated): No VAP protocol (if indicated): Not indicated DVT prophylaxis: SCDs GI prophylaxis: PPI Glucose control:  SSI No Central venous access:  N/A Arterial line:  N/A Foley:  N/A Mobility:  bed rest  Code Status:  FULL Disposition: ICU     DVT/GI PRX  assessed I Assessed the need for Labs I Assessed the need for Foley I Assessed the need for Central Venous Line Family Discussion when available I Assessed the need  for Mobilization I made an Assessment of medications to be adjusted accordingly Safety Risk assessment completed  CASE DISCUSSED IN MULTIDISCIPLINARY ROUNDS WITH ICU TEAM     Critical Care Time devoted to patient care services described in this note is 65 minutes.  Critical care was necessary to treat /prevent imminent and life-threatening deterioration. Overall, patient is critically ill, prognosis is guarded.  Patient with Multiorgan failure and at high risk for cardiac arrest and death.    Lucie Leather, M.D.  [facility]  Medical Director [facility] Medical Director [facility] Cardio-Pulmonary Department

## 2021-01-14 NOTE — Progress Notes (Signed)
No acute events overnight, pulse has improved with the addition of Digoxin, it is now in the 110's instead of the 140-150's. Blood pressure has improved as well. Patient has had multiple bowel movements over the night, but they have all been type 6 vs type7

## 2021-01-14 NOTE — Progress Notes (Signed)
SUBJECTIVE: Patient feeling much better no chest pain shortness of breath   Vitals:   01/14/21 0900 01/14/21 1000 01/14/21 1100 01/14/21 1138  BP: 117/77 113/68 117/82   Pulse: (!) 116 (!) 113 (!) 107 (!) 122  Resp: (!) 34 (!) 27 (!) 32 (!) 41  Temp:    (!) 96.6 F (35.9 C)  TempSrc:    Axillary  SpO2: (!) 86% 93% 90% 93%  Weight:      Height:        Intake/Output Summary (Last 24 hours) at 01/14/2021 1146 Last data filed at 01/14/2021 0945 Gross per 24 hour  Intake 262.6 ml  Output 235 ml  Net 27.6 ml    LABS: Basic Metabolic Panel: Recent Labs    01/13/21 0306 01/13/21 1453 01/14/21 0536  NA 129*  --  131*  K 3.3* 3.7 3.6  CL 93*  --  97*  CO2 21*  --  22  GLUCOSE 115*  --  78  BUN 34*  --  38*  CREATININE 1.88*  --  2.17*  CALCIUM 7.7*  --  7.6*  MG 2.5*  --  2.5*  PHOS 3.9  --  4.3   Liver Function Tests: Recent Labs    01/13/21 0306 01/14/21 0536  AST 11* 10*  ALT 9 7  ALKPHOS 409* 459*  BILITOT 0.9 0.9  PROT 4.2* 4.1*  ALBUMIN 1.7* 1.6*   No results for input(s): LIPASE, AMYLASE in the last 72 hours. CBC: Recent Labs    01/13/21 0306 01/14/21 0536  WBC 7.8 5.7  NEUTROABS 5.9 4.2  HGB 9.9* 9.2*  HCT 29.1* 26.8*  MCV 82.2 82.2  PLT 121* 94*   Cardiac Enzymes: No results for input(s): CKTOTAL, CKMB, CKMBINDEX, TROPONINI in the last 72 hours. BNP: Invalid input(s): POCBNP D-Dimer: No results for input(s): DDIMER in the last 72 hours. Hemoglobin A1C: No results for input(s): HGBA1C in the last 72 hours. Fasting Lipid Panel: No results for input(s): CHOL, HDL, LDLCALC, TRIG, CHOLHDL, LDLDIRECT in the last 72 hours. Thyroid Function Tests: No results for input(s): TSH, T4TOTAL, T3FREE, THYROIDAB in the last 72 hours.  Invalid input(s): FREET3 Anemia Panel: No results for input(s): VITAMINB12, FOLATE, FERRITIN, TIBC, IRON, RETICCTPCT in the last 72 hours.   PHYSICAL EXAM General: Well developed, well nourished, in no acute  distress HEENT:  Normocephalic and atramatic Neck:  No JVD.  Lungs: Clear bilaterally to auscultation and percussion. Heart: HRRR . Normal S1 and S2 without gallops or murmurs.  Abdomen: Bowel sounds are positive, abdomen soft and non-tender  Msk:  Back normal, normal gait. Normal strength and tone for age. Extremities: No clubbing, cyanosis or edema.   Neuro: Alert and oriented X 3. Psych:  Good affect, responds appropriately  TELEMETRY: Atrial fibrillation with ventricular rate about 110  ASSESSMENT AND PLAN: Atrial fibrillation with rapid ventricular response rate.  Currently ventricular rate is 110 and is on digoxin for rate control.  Advise continuing digoxin.  Active Problems:   Hypovolemic shock (HCC)   Colitis due to Clostridium difficile   Diarrhea    Lauren Nancy, MD, [facility] 01/14/2021 11:46 AM

## 2021-01-14 NOTE — Progress Notes (Signed)
Chronic Atrial Fibrillation  Digoxin level elevated 6.1, discussed with Rx- will repeat lab at 2100 (3 hours after last dose) Pt. Asymptomatic currently.  - 20:00 & 10:00 doses discontinued, will re-evaluate daily dosing after 21:00 level > with renal function (CrCl: 21.4) Up to date & Rx agree on 0.0625 every 48 hours - K+ 3.3 @ 18:00, supplemented with 40 meq PO - continue to monitor electrolytes closely, supplement PRN - monitor for signs of toxicity: GI s/s, confusion, weakness >> will hold off on reversal agent unless symptomatic - Cardiology consulted, appreciate input - Continuous cardiac monitoring  Plan discussed with Doreen Salvage, RN   Cheryll Cockayne Rust-Chester, AGACNP-BC Acute Care Nurse Practitioner [facility]   [phone] / [phone] Please see Amion for pager details.

## 2021-01-14 NOTE — Progress Notes (Addendum)
Lauren Bradshaw  MRN: [id number]  DOB/AGE: [birth year] 68 y.o.  Primary Care Physician:Ramos, Ria Bush, MD  Admit date: 12/15/2020  Chief Complaint:  Chief Complaint  Patient presents with   Weakness    S-Pt presented on  12/26/2020 with  Chief Complaint  Patient presents with   Weakness  . Patient offers no new specific physical complaints. Patient family was present in the room. I discussed patient's clinical course at [facility].  I reviewed patient's labs and neurological findings from kidney standpoint with the patient.  Patient daughter-in-law was present in the room via phone.  Patient daughter-in-law had the access to patient's data from [facility].  I was unable to access care everywhere.  I had contacted IT to help me with this.  In the meantime patient daughter-in-law had access to patient's chart.  And she reviewed the chart with me on July and informed me that patient did indeed go for interventional radiology procedure after there was a bleeding noted on her kidney and had embolization done.Patient and the family asked me relevant question about her kidney related issues.  I also the question to the best of my ability.  Medications    budesonide (PULMICORT) nebulizer solution  0.5 mg Nebulization BID   Chlorhexidine Gluconate Cloth  6 each Topical Daily   dextrose  1 ampule Intravenous Once   heparin injection (subcutaneous)  5,000 Units Subcutaneous Q8H   levothyroxine  75 mcg Oral Q0600   mouth rinse  15 mL Mouth Rinse BID   pantoprazole (PROTONIX) IV  40 mg Intravenous Q24H   vancomycin  500 mg Oral Q6H         [id number] from the symptoms mentioned above,there are no other symptoms referable to all systems reviewed.  Physical Exam: Vital signs in last 24 hours: Temp:  [96.8 F (36 C)-97.5 F (36.4 C)] 96.8 F (36 C) (11/05 0800) Pulse Rate:  [51-147] 116 (11/05 0900) Resp:  [23-40] 34 (11/05 0900) BP: (71-135)/(47-120) 117/77 (11/05 0900) SpO2:  [83 %-96 %] 86 % (11/05  0900) FiO2 (%):  [50 %-55 %] 55 % (11/05 0832) Weight change:  Last BM Date: 01/13/21  Intake/Output from previous day: 11/04 0701 - 11/05 0700 In: 168.3 [I.V.:68.3; IV Piggyback:100] Out: 200 [Urine:200] Total I/O In: 120 [P.O.:120] Out: 45 [Urine:45]   Physical Exam:  General- pt is awake,alert, oriented to time place and person  Resp- No acute REsp distress, CTA B/L NO Rhonchi  CVS- S1S2 regular in rate and rhythm  GIT- BS+, soft, Non tender , Non distended  EXT- No LE Edema,  No Cyanosis    Lab Results:  CBC  Recent Labs    01/13/21 0306 01/14/21 0536  WBC 7.8 5.7  HGB 9.9* 9.2*  HCT 29.1* 26.8*  PLT 121* 94*    BMET  Recent Labs    01/13/21 0306 01/13/21 1453 01/14/21 0536  NA 129*  --  131*  K 3.3* 3.7 3.6  CL 93*  --  97*  CO2 21*  --  22  GLUCOSE 115*  --  78  BUN 34*  --  38*  CREATININE 1.88*  --  2.17*  CALCIUM 7.7*  --  7.6*      Most recent Creatinine trend  Lab Results  Component Value Date   CREATININE 2.17 (H) 01/14/2021   CREATININE 1.88 (H) 01/13/2021   CREATININE 1.82 (H) 01/12/2021      MICRO   Recent Results (from the past 240 hour(s))  Resp Panel by  RT-PCR (Flu A&B, Covid) Nasopharyngeal Swab     Status: None   Collection Time: 12/25/2020 10:14 AM   Specimen: Nasopharyngeal Swab; Nasopharyngeal(NP) swabs in vial transport medium  Result Value Ref Range Status   SARS Coronavirus 2 by RT PCR NEGATIVE NEGATIVE Final    Comment: (NOTE) SARS-CoV-2 target nucleic acids are NOT DETECTED.  The SARS-CoV-2 RNA is generally detectable in upper respiratory specimens during the acute phase of infection. The lowest concentration of SARS-CoV-2 viral copies this assay can detect is 138 copies/mL. A negative result does not preclude SARS-Cov-2 infection and should not be used as the sole basis for treatment or other patient management decisions. A negative result may occur with  improper specimen collection/handling,  submission of specimen other than nasopharyngeal swab, presence of viral mutation(s) within the areas targeted by this assay, and inadequate number of viral copies(<138 copies/mL). A negative result must be combined with clinical observations, patient history, and epidemiological information. The expected result is Negative.  Fact Sheet for Patients:  EntrepreneurPulse.com.au  Fact Sheet for Healthcare Providers:  IncredibleEmployment.be  This test is no t yet approved or cleared by the [place] FDA and  has been authorized for detection and/or diagnosis of SARS-CoV-2 by FDA under an Emergency Use Authorization (EUA). This EUA will remain  in effect (meaning this test can be used) for the duration of the COVID-19 declaration under Section 564(b)(1) of the Act, 21 U.S.C.section 360bbb-3(b)(1), unless the authorization is terminated  or revoked sooner.       Influenza A by PCR NEGATIVE NEGATIVE Final   Influenza B by PCR NEGATIVE NEGATIVE Final    Comment: (NOTE) The Xpert Xpress SARS-CoV-2/FLU/RSV plus assay is intended as an aid in the diagnosis of influenza from Nasopharyngeal swab specimens and should not be used as a sole basis for treatment. Nasal washings and aspirates are unacceptable for Xpert Xpress SARS-CoV-2/FLU/RSV testing.  Fact Sheet for Patients: EntrepreneurPulse.com.au  Fact Sheet for Healthcare Providers: IncredibleEmployment.be  This test is not yet approved or cleared by the [place] FDA and has been authorized for detection and/or diagnosis of SARS-CoV-2 by FDA under an Emergency Use Authorization (EUA). This EUA will remain in effect (meaning this test can be used) for the duration of the COVID-19 declaration under Section 564(b)(1) of the Act, 21 U.S.C. section 360bbb-3(b)(1), unless the authorization is terminated or revoked.  Performed at [facility], [place], [place] [address]   C Difficile Quick Screen w PCR reflex     Status: Abnormal   Collection Time: 12/18/2020 10:14 AM   Specimen: STOOL  Result Value Ref Range Status   C Diff antigen POSITIVE (A) NEGATIVE Final   C Diff toxin POSITIVE (A) NEGATIVE Final   C Diff interpretation Toxin producing C. difficile detected.  Final    Comment: POSITIVE CRITICAL RESULT CALLED TO, READ BACK BY AND VERIFIED WITH: OLIVIA PRILLIEM 12/17/2020 1715 MU Performed at [facility], [address] [place], [place] [address]   Gastrointestinal Panel by PCR , Stool     Status: None   Collection Time: 12/12/2020 10:14 AM   Specimen: STOOL  Result Value Ref Range Status   Campylobacter species NOT DETECTED NOT DETECTED Final   Plesimonas shigelloides NOT DETECTED NOT DETECTED Final   Salmonella species NOT DETECTED NOT DETECTED Final   Yersinia enterocolitica NOT DETECTED NOT DETECTED Final   Vibrio species NOT DETECTED NOT DETECTED Final   Vibrio cholerae NOT DETECTED NOT DETECTED Final  Enteroaggregative E coli (EAEC) NOT DETECTED NOT DETECTED Final   Enteropathogenic E coli (EPEC) NOT DETECTED NOT DETECTED Final   Enterotoxigenic E coli (ETEC) NOT DETECTED NOT DETECTED Final   Shiga like toxin producing E coli (STEC) NOT DETECTED NOT DETECTED Final   Shigella/Enteroinvasive E coli (EIEC) NOT DETECTED NOT DETECTED Final   Cryptosporidium NOT DETECTED NOT DETECTED Final   Cyclospora cayetanensis NOT DETECTED NOT DETECTED Final   Entamoeba histolytica NOT DETECTED NOT DETECTED Final   Giardia lamblia NOT DETECTED NOT DETECTED Final   Adenovirus F40/41 NOT DETECTED NOT DETECTED Final   Astrovirus NOT DETECTED NOT DETECTED Final   Norovirus GI/GII NOT DETECTED NOT DETECTED Final   Rotavirus A NOT DETECTED NOT DETECTED Final   Sapovirus (I, II, IV, and V) NOT DETECTED NOT DETECTED Final    Comment: Performed at [facility], [address] [place], [place] [address]   Blood Culture (routine x 2)     Status: None   Collection Time: 01/02/2021  1:49 PM   Specimen: BLOOD  Result Value Ref Range Status   Specimen Description BLOOD LEFT ANTECUBITAL  Final   Special Requests   Final    BOTTLES DRAWN AEROBIC ONLY Blood Culture results may not be optimal due to an inadequate volume of blood received in culture bottles   Culture   Final    NO GROWTH 5 DAYS Performed at [facility], [address] [place], [place] [address]    Report Status 01/14/2021 FINAL  Final  Blood Culture (routine x 2)     Status: None   Collection Time: 01/06/2021  2:16 PM   Specimen: BLOOD  Result Value Ref Range Status   Specimen Description BLOOD BLOOD RIGHT ARM  Final   Special Requests   Final    BOTTLES DRAWN AEROBIC AND ANAEROBIC Blood Culture adequate volume   Culture   Final    NO GROWTH 5 DAYS Performed at [facility], [address], [place], [place] [address]    Report Status 01/14/2021 FINAL  Final  MRSA Next Gen by PCR, Nasal     Status: None   Collection Time: 12/16/2020  6:05 PM   Specimen: Nasal Mucosa; Nasal Swab  Result Value Ref Range Status   MRSA by PCR Next Gen NOT DETECTED NOT DETECTED Final    Comment: (NOTE) The GeneXpert MRSA Assay (FDA approved for NASAL specimens only), is one component of a comprehensive MRSA colonization surveillance program. It is not intended to diagnose MRSA infection nor to guide or monitor treatment for MRSA infections. Test performance is not FDA approved in patients less than 2 years old. Performed at [facility], [address], [place], [place] [address]   Urine Culture     Status: None   Collection Time: 01/10/21  8:07 AM   Specimen: In/Out Cath Urine  Result Value Ref Range Status   Specimen Description   Final    IN/OUT CATH URINE Performed at [facility], [address] [place], [place] [address]    Special Requests   Final    NONE Performed at [facility], [address], [place], [place] [address]    Culture   Final    NO GROWTH Performed at [facility], [address], [place], [place] [address]    Report Status 01/11/2021 FINAL  Final  Body fluid culture w Gram Stain     Status: None (Preliminary result)   Collection Time:  01/13/21  2:45 PM   Specimen: PATH Cytology Pleural fluid  Result Value Ref Range Status   Specimen Description   Final    PLEURAL Performed at [facility], [address] [place], [place] [address]    Special Requests   Final    PLEURAL Performed at [facility], [address] [place], [place] [address]    Gram Stain   Final    NO SQUAMOUS EPITHELIAL CELLS SEEN NO WBC SEEN NO ORGANISMS SEEN    Culture   Final    NO GROWTH < 12 HOURS Performed at [facility], [place] [address], [place], [place] [address]    Report Status PENDING  Incomplete     CT abdomen  done on  10.31.22 showed 2. 8.7 cm low-density left renal subcapsular fluid collection, concerning for abscess. Correlate with urinalysis. Addendum  The patient recently underwent a random left renal biopsy a few weeks ago that a required  embolization. As such, the left renal subcapsular fluid collection  likely represents old hematoma and the previously described 4 mm  left renal calculus is likely a vascular coil.   Impression:   Lauren Bradshaw is a 70 y.o. female with medical problems of  C Diff, A Fib, HTN, HDL, vasculitis , pleural effusions was admitted on 12/12/2020 for :   Dehydration [E86.0] Hypovolemic shock ([place]) [R57.1] Hyponatremia [E87.1] Weakness [R53.1] Atrial fibrillation with rapid ventricular response (HCC) [I48.91] Hypotension, unspecified hypotension type [I95.9] Diarrhea, unspecified type [R19.7] Sepsis, due to unspecified organism, unspecified whether acute organ dysfunction present ([place]) [A41.9]  Impression  1)Renal    AKI secondary to  ATN caused by hemodynamic instability   Patient recently had A. fib with RVR Patient is admitted with septic shock  AKI on CKD  CKD stage 3b. 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  Pt was recently diagnosed with Vasculitis.  Pt received  Patient has already received rituximab couple months ago as per information available.  There is possible contribution from renal infarction as well as patient did undergo coil embolization of the kidney on the left side after her renal biopsy   Patient's AKI is worsening No need for renal replacement therapy yet Patient is nonoliguric   2)Hypotension   Blood pressure is stable    3)Anemia of chronic disease  CBC Latest Ref Rng & Units 01/14/2021 01/13/2021 01/12/2021  WBC 4.0 - 10.5 K/uL 5.7 7.8 5.1  Hemoglobin 12.0 - 15.0 g/dL 9.2(L) 9.9(L) 9.5(L)  Hematocrit 36.0 - 46.0 % 26.8(L) 29.1(L) 28.3(L)  Platelets 150 - 400 K/uL 94(L) 121(L) 123(L)    Patient has thrombocytopenia secondary to multiple factors Patient admitted with sepsis    HGb at goal (9--11)   4) A fib w RVR Now better    5)Patient had hypovolemic shock Patient admitted with hypotension, lactic acidosis, colitis due to C. difficile  Surgery team and pulmonary/intensivist is following    6) Electrolytes   BMP Latest Ref Rng & Units 01/14/2021 01/13/2021 01/13/2021  Glucose 70 - 99 mg/dL 78 - 115(H)  BUN 8 - 23 mg/dL 38(H) - 34(H)  Creatinine 0.44 - 1.00 mg/dL 2.17(H) - 1.88(H)  Sodium 135 - 145 mmol/L 131(L) - 129(L)  Potassium 3.5 - 5.1 mmol/L 3.6 3.7 3.3(L)  Chloride 98 - 111 mmol/L 97(L) - 93(L)  CO2 22 - 32 mmol/L 22 - 21(L)  Calcium 8.9 - 10.3 mg/dL 7.6(L) - 7.7(L)     Sodium Hyponatremia Patient sodium is stable   Potassium Hypokalemia Patient potassium is now better    7)Acid base  Co2 at goal   8) A. fib with RVR Cardiology is  following  Plan:  We will continue the current treatment I did educate the patient and the family about multiple kidney related injuries in recent history Patient was diagnosed with ANCA associated vasculitis requiring rituximab -Patient had renal hematoma after she had renal biopsy and required coil embolization -And now patient is admitted with hypotension, sepsis, A. fib with RVR and is having ATN. I discussed with the patient family about possible need for renal replacement therapy      Treyten Monestime s Kohana Amble 01/14/2021, 10:00 AM

## 2021-01-14 NOTE — Progress Notes (Signed)
CC: C Diff colitis    Subjective  Much better from the abdominal perspective.  Mild soreness.  No fevers.  She does have significant hypoxia and is on high flow O2.  I personally reviewed the abdominal x-ray showing no evidence of pneumatosis no evidence of free air no evidence of toxic megacolon.  I have also reviewed the CT scan on arrival showing evidence of pancolitis Hypoxemia and A fib the main issues Tolerated CLD  Objective: Vital signs in last 24 hours: Temp:  [96.6 F (35.9 C)-97.5 F (36.4 C)] 96.6 F (35.9 C) (11/05 1138) Pulse Rate:  [54-147] 122 (11/05 1138) Resp:  [23-41] 41 (11/05 1138) BP: (71-135)/(47-120) 117/82 (11/05 1100) SpO2:  [83 %-96 %] 93 % (11/05 1138) FiO2 (%):  [50 %-55 %] 55 % (11/05 1137) Last BM Date: 01/13/21  Intake/Output from previous day: 11/04 0701 - 11/05 0700 In: 168.3 [I.V.:68.3; IV Piggyback:100] Out: 200 [Urine:200] Intake/Output this shift: Total I/O In: 510.3 [P.O.:240; I.V.:16.9; IV Piggyback:253.5] Out: 80 [Urine:80]  Physical exam: She is dyspneic Chest: decrease BS bilaterally wearing high flow O2 Cardiac: a fib HR 120s Abd: soft, mild diffuse tenderness w/o peritonitis  Lab Results: CBC  Recent Labs    01/13/21 0306 01/14/21 0536  WBC 7.8 5.7  HGB 9.9* 9.2*  HCT 29.1* 26.8*  PLT 121* 94*   BMET Recent Labs    01/13/21 0306 01/13/21 1453 01/14/21 0536  NA 129*  --  131*  K 3.3* 3.7 3.6  CL 93*  --  97*  CO2 21*  --  22  GLUCOSE 115*  --  78  BUN 34*  --  38*  CREATININE 1.88*  --  2.17*  CALCIUM 7.7*  --  7.6*   PT/INR No results for input(s): LABPROT, INR in the last 72 hours. ABG No results for input(s): PHART, HCO3 in the last 72 hours.  Invalid input(s): PCO2, PO2  Studies/Results: DG Chest 1 View  Result Date: 01/13/2021 CLINICAL DATA:  70 year old female with history of shortness of breath. EXAM: CHEST  1 VIEW COMPARISON:  Chest x-ray 01/10/2021. FINDINGS: Lung volumes are low. Worsening  patchy multifocal interstitial and airspace disease asymmetrically distributed throughout the lungs bilaterally, most confluent throughout the right mid to lower lung. Mild blunting of the costophrenic sulci bilaterally which may suggest trace bilateral pleural effusions. No pneumothorax. Pulmonary vasculature is largely obscured, but does not appearing origin. Heart size is normal. Upper mediastinal contours are within normal limits. Atherosclerotic calcifications in the thoracic aorta. IMPRESSION: 1. The appearance the chest is concerning for severely worsening multilobar bilateral pneumonia. Probable trace bilateral pleural effusions. 2. Aortic atherosclerosis. Electronically Signed   By: Vinnie Langton M.D.   On: 01/13/2021 06:26   DG Abd 1 View  Result Date: 01/13/2021 CLINICAL DATA:  Weakness. EXAM: ABDOMEN - 1 VIEW COMPARISON:  January 11, 2021. FINDINGS: The bowel gas pattern is normal. Phleboliths are noted in the pelvis. Stable position of left femoral catheter. IMPRESSION: No abnormal bowel dilatation. Electronically Signed   By: Marijo Conception M.D.   On: 01/13/2021 15:51   DG Chest Port 1 View  Result Date: 01/14/2021 CLINICAL DATA:  Worsening hypoxia.  Pancolitis. EXAM: PORTABLE CHEST 1 VIEW COMPARISON:  01/13/2021 FINDINGS: Heart size remains within normal limits. Aortic atherosclerotic calcification noted. Increased heterogeneous airspace disease is seen bilaterally. Multiple ill-defined nodular opacities are also noted, which appear new. This raises possibility of septic emboli. No evidence of pleural effusion. IMPRESSION: Increased bilateral heterogeneous airspace  disease with ill-defined nodular opacities. This raises possibility of septic emboli. Consider chest CTA for further evaluation. These results will be called to the ordering clinician or representative by the Radiologist Assistant, and communication documented in the PACS or [employer]. Electronically Signed   By: Danae Orleans M.D.   On: 01/14/2021 10:39   DG Chest Port 1 View  Result Date: 01/13/2021 CLINICAL DATA:  Pleural effusion.  Status post thoracentesis. EXAM: PORTABLE CHEST 1 VIEW COMPARISON:  01/13/2021 at 5:57 a.m. FINDINGS: The cardiomediastinal silhouette is unchanged with normal heart size. The lungs remain hypoinflated with interstitial and patchy airspace opacities bilaterally. Lung aeration as overall mildly improved, most notably in the right mid to lower lung. There may be trace bilateral pleural effusions. No pneumothorax is identified. IMPRESSION: Bilateral airspace disease with mildly improved aeration on the right. No pneumothorax. Electronically Signed   By: Sebastian Ache M.D.   On: 01/13/2021 14:33   DG Abd Portable 2V  Result Date: 01/14/2021 CLINICAL DATA:  70 year old female with colitis EXAM: PORTABLE ABDOMEN - 2 VIEW COMPARISON:  01/13/2021, abdominal CT 02/03/21 FINDINGS: Overlying EKG leads somewhat limit the evaluation. Relative paucity of bowel gas, with paucity of gas in the stomach small bowel and colon. Trace gas present within left and right colon. No abnormal distension or air-fluid level. No unexpected radiopaque foreign body. Vascular calcifications present. Metallic coils project over the left renal silhouette similar to the prior CT. Left femoral vascular line. Degenerative changes of the spine, hips, bilateral sacroiliac joints. No acute displaced fracture. IMPRESSION: Nonspecific bowel gas pattern, with no definite evidence of obstruction. Left femoral vascular line Electronically Signed   By: Gilmer Mor D.O.   On: 01/14/2021 10:48   US THORACENTESIS ASP PLEURAL SPACE W/IMG GUIDE  Result Date: 01/13/2021 INDICATION: 70 year old with pleural effusions. EXAM: ULTRASOUND GUIDED LEFT THORACENTESIS MEDICATIONS: None. COMPLICATIONS: None immediate. PROCEDURE: An ultrasound guided thoracentesis was thoroughly discussed with the patient and questions answered. The benefits, risks,  alternatives and complications were also discussed. The patient understands and wishes to proceed with the procedure. Written consent was obtained. Ultrasound was performed to localize and mark an adequate pocket of fluid in the left chest. The area was then prepped and draped in the normal sterile fashion. 1% Lidocaine was used for local anesthesia. Under ultrasound guidance a 6 Fr Safe-T-Centesis catheter was introduced. Thoracentesis was performed. The catheter was removed and a dressing applied. FINDINGS: Ultrasound performed prior to the procedure demonstrated bilateral pleural effusions. A total of approximately 450 mL of yellow fluid was removed. Samples were sent to the laboratory as requested by the clinical team. IMPRESSION: Successful ultrasound guided left thoracentesis yielding 450 mL of pleural fluid. Electronically Signed   By: Richarda Overlie M.D.   On: 01/13/2021 16:57    Anti-infectives: Anti-infectives (From admission, onward)    Start     Dose/Rate Route Frequency Ordered Stop   03/07/21 1000  vancomycin (VANCOCIN) capsule 125 mg  Status:  Discontinued       See Hyperspace for full Linked Orders Report.   125 mg Oral Every 3 DAYS 2021/02/03 1827 02-03-21 1831   02/07/21 1000  vancomycin (VANCOCIN) capsule 125 mg  Status:  Discontinued       See Hyperspace for full Linked Orders Report.   125 mg Oral Every other day 03-Feb-2021 1827 02/03/21 1831   01/31/21 1000  vancomycin (VANCOCIN) capsule 125 mg  Status:  Discontinued       See Hyperspace for full  Linked Orders Report.   125 mg Oral Daily 01/03/2021 1827 12/16/2020 1831   01/23/21 2200  vancomycin (VANCOCIN) capsule 125 mg  Status:  Discontinued       See Hyperspace for full Linked Orders Report.   125 mg Oral 2 times daily 12/17/2020 1827 01/07/2021 1831   01/10/21 1700  ciprofloxacin (CIPRO) IVPB 400 mg  Status:  Discontinued        400 mg 200 mL/hr over 60 Minutes Intravenous Every 24 hours 12/10/2020 1731 12/24/2020 1827   12/12/2020 2200   metroNIDAZOLE (FLAGYL) IVPB 500 mg  Status:  Discontinued        500 mg 100 mL/hr over 60 Minutes Intravenous Every 8 hours 12/28/2020 1711 12/10/2020 1827   12/29/2020 2000  metroNIDAZOLE (FLAGYL) IVPB 500 mg        500 mg 100 mL/hr over 60 Minutes Intravenous Every 8 hours 12/21/2020 1831 01/23/21 1959   12/26/2020 1930  metroNIDAZOLE (FLAGYL) IVPB 500 mg  Status:  Discontinued        500 mg 100 mL/hr over 60 Minutes Intravenous Every 8 hours 01/06/2021 1830 12/30/2020 1831   01/08/2021 1930  vancomycin (VANCOCIN) capsule 500 mg        500 mg Oral Every 6 hours 01/08/2021 1831 01/23/21 1759   12/17/2020 1915  vancomycin (VANCOCIN) capsule 125 mg  Status:  Discontinued       See Hyperspace for full Linked Orders Report.   125 mg Oral 4 times daily 12/16/2020 1827 01/03/2021 1831   12/13/2020 1630  piperacillin-tazobactam (ZOSYN) IVPB 3.375 g  Status:  Discontinued        3.375 g 12.5 mL/hr over 240 Minutes Intravenous Every 8 hours 12/24/2020 1619 12/16/2020 1710   01/03/2021 1330  ceFEPIme (MAXIPIME) 2 g in sodium chloride 0.9 % 100 mL IVPB        2 g 200 mL/hr over 30 Minutes Intravenous  Once 12/11/2020 1325 12/27/2020 1602   12/29/2020 1330  metroNIDAZOLE (FLAGYL) IVPB 500 mg  Status:  Discontinued        500 mg 100 mL/hr over 60 Minutes Intravenous  Once 01/07/2021 1325 01/04/2021 1727   12/31/2020 1330  vancomycin (VANCOCIN) IVPB 1000 mg/200 mL premix        1,000 mg 200 mL/hr over 60 Minutes Intravenous  Once 01/08/2021 1325 12/27/2020 1801       Assessment/Plan:  C. difficile colitis without evidence of toxic megacolon.  Clinically improving from abdominal perspective unfortunately she does have other respiratory and cardiac issues that will need to be addressed. Surgical perspective no need for surgical intervention.  If she were to require a subtotal colectomy with end ileostomy there is significant perioperative risk and a significant perioperative mortality.  Discussed with the family and the patient in  detail Pending CT scan today We will continue to follow Please note that I spent greater than 35 minutes in this encounter including personally reviewing  her films, answering the patient and family, coordinating her care and performing appropriate documentation  Caroleen Hamman, MD, FACS  01/14/2021

## 2021-01-14 NOTE — Evaluation (Signed)
Physical Therapy Evaluation Patient Details Name: Lauren Bradshaw MRN: [id number] DOB: 20-Mar-1950 Today's Date: 01/14/2021  History of Present Illness  Pt is a 70 year old female with a history of atrial fibrillation, hypertension, hyperlipidemia, vasculitis who presents to the emergency department with generalized weakness and was subsequently discovered to be in atrial fibrillation with RVR in the setting of sepsis due to c diff, and multisystem organ failure. PMH: a-fib, c diff, GERD, HLD, HTN, seizure, thyroid disease, vasculitis, insomnia  Clinical Impression  MD cleared pt for participation in session via secure chat. Pt received in bed, requiring encouragement for participation. Pt seen as co-tx with OT. Pt initiates moving BLE to EOB but ultimately requires max assist +2 to complete supine<>sit and mod assist +1 for rolling L<>R. Pt tolerates sitting EOB ~4 minutes with close supervision & engages in BLE strengthening exercises. HR 130s-155 bpm during session, SpO2 dropped to 79% initially upon sitting EOB but otherwise 86%-91% during session with therapists providing cuing for pursed lip breathing. Pt fatigues quickly & requests to return supine. Therapists provide dependent assist for peri hygiene 2/2 incontinent BM. Skin tear noted to posterior R knee & nurse notified. Pt demonstrates decline in functional mobility & would benefit from ongoing PT services to progress bed mobility, transfers, & endurance as able.     Recommendations for follow up therapy are one component of a multi-disciplinary discharge planning process, led by the attending physician.  Recommendations may be updated based on patient status, additional functional criteria and insurance authorization.  Follow Up Recommendations Acute inpatient rehab (3hours/day)    Assistance Recommended at Discharge Frequent or constant Supervision/Assistance  Functional Status Assessment Patient has had a recent decline in their functional  status and demonstrates the ability to make significant improvements in function in a reasonable and predictable amount of time.  Equipment Recommendations   (TBD in next venue)    Recommendations for Other Services       Precautions / Restrictions Precautions Precautions: Fall Restrictions Weight Bearing Restrictions: No      Mobility  Bed Mobility Overal bed mobility: Needs Assistance Bed Mobility: Rolling;Supine to Sit;Sit to Supine Rolling: Mod assist   Supine to sit: Max assist;+2 for physical assistance;HOB elevated Sit to supine: Max assist;+2 for physical assistance;HOB elevated   General bed mobility comments: Pt requires use of bed rails, HOB elevated & +2 assist for supine<>sit.    Transfers                        Ambulation/Gait                Stairs            Wheelchair Mobility    Modified Rankin (Stroke Patients Only)       Balance Overall balance assessment: Needs assistance Sitting-balance support: Feet unsupported;Bilateral upper extremity supported Sitting balance-Leahy Scale: Fair Sitting balance - Comments: close supervision static sitting     Standing balance-Leahy Scale:  (not safe to attempt)                               Pertinent Vitals/Pain Pain Assessment: No/denies pain    Home Living Family/patient expects to be discharged to:: Private residence Living Arrangements: Children Available Help at Discharge: Family;Available 24 hours/day Type of Home: House Home Access: Ramped entrance         Home Equipment: Wheelchair - manual;BSC  Prior Function               Mobility Comments: Pt recently discharged from [facility] inpatient rehab & was able to perform slide board transfers independently prior to this medical event.       Hand Dominance        Extremity/Trunk Assessment   Upper Extremity Assessment Upper Extremity Assessment: Generalized weakness    Lower Extremity  Assessment Lower Extremity Assessment: Generalized weakness (2/5 BLE knee extension in sitting, BLE feet edema)    Cervical / Trunk Assessment Cervical / Trunk Assessment:  (while sitting EOB pt sits with downward head)  Communication      Cognition Arousal/Alertness: Awake/alert Behavior During Therapy: Flat affect Overall Cognitive Status: Within Functional Limits for tasks assessed                                 General Comments: Requires encouragement for participation        General Comments      Exercises General Exercises - Lower Extremity Long Arc Quad: AROM;Strengthening;Both;10 reps;Seated Other Exercises Other Exercises: Encouraged pt to perform BLE ankle pumps to help with BLE edema throughout the day.   Assessment/Plan    PT Assessment Patient needs continued PT services  PT Problem List Decreased strength;Decreased mobility;Decreased range of motion;Decreased activity tolerance;Cardiopulmonary status limiting activity;Decreased skin integrity;Decreased balance;Decreased knowledge of use of DME       PT Treatment Interventions DME instruction;Therapeutic activities;Modalities;Therapeutic exercise;Patient/family education;Balance training;Wheelchair mobility training;Gait training;Functional mobility training;Neuromuscular re-education;Manual techniques    PT Goals (Current goals can be found in the Care Plan section)  Acute Rehab PT Goals Patient Stated Goal: get better PT Goal Formulation: With patient Time For Goal Achievement: 01/28/21 Potential to Achieve Goals: Good Additional Goals Additional Goal #1: Pt will propel w/c 100 ft with supervision to increase independence with household mobility.    Frequency 7X/week   Barriers to discharge        Co-evaluation PT/OT/SLP Co-Evaluation/Treatment: Yes Reason for Co-Treatment: For patient/therapist safety;To address functional/ADL transfers;Complexity of the patient's impairments  (multi-system involvement) PT goals addressed during session: Mobility/safety with mobility;Balance;Strengthening/ROM         AM-PAC PT "6 Clicks" Mobility  Outcome Measure Help needed turning from your back to your side while in a flat bed without using bedrails?: A Lot Help needed moving from lying on your back to sitting on the side of a flat bed without using bedrails?: Total Help needed moving to and from a bed to a chair (including a wheelchair)?: Total Help needed standing up from a chair using your arms (e.g., wheelchair or bedside chair)?: Total Help needed to walk in hospital room?: Total Help needed climbing 3-5 steps with a railing? : Total 6 Click Score: 7    End of Session Equipment Utilized During Treatment: Oxygen Activity Tolerance: Patient limited by fatigue;Patient tolerated treatment well Patient left: in bed;with call bell/phone within reach;with bed alarm set (4 rails up per pt request) Nurse Communication: Mobility status (skin tear on posterior R knee, BM during session) PT Visit Diagnosis: Unsteadiness on feet (R26.81);Difficulty in walking, not elsewhere classified (R26.2);Muscle weakness (generalized) (M62.81)    Time: 6734-1937 PT Time Calculation (min) (ACUTE ONLY): 21 min   Charges:   PT Evaluation $PT Eval High Complexity: 1 High          Aleda Grana, PT, DPT 01/14/21, 9:21 AM   Sandi Mariscal 01/14/2021, 9:18 AM

## 2021-01-14 NOTE — Progress Notes (Signed)
Inpatient Rehab Admissions Coordinator Note:   Per PT/OT patient was screened for CIR candidacy by Jola Critzer Luvenia Starch, CCC-SLP. At this time, pt has not yet attempted transfers and was noted to fatigue quickly during therapy evaluations.  Pt may have potential to progress to becoming a potential CIR candidate. CIR admissions team will follow and monitor for progress and participation with therapies. A consult order will be placed if pt appears to be an appropriate candidate at that time.   Please contact me any with questions.Lauren Phoenix, MS, CCC-SLP Admissions Coordinator [phone] 01/14/21 5:47 PM

## 2021-01-15 ENCOUNTER — Inpatient Hospital Stay: Payer: Medicare HMO

## 2021-01-15 DIAGNOSIS — I4891 Unspecified atrial fibrillation: Secondary | ICD-10-CM | POA: Diagnosis not present

## 2021-01-15 DIAGNOSIS — K51018 Ulcerative (chronic) pancolitis with other complication: Secondary | ICD-10-CM | POA: Diagnosis not present

## 2021-01-15 DIAGNOSIS — A419 Sepsis, unspecified organism: Secondary | ICD-10-CM | POA: Diagnosis not present

## 2021-01-15 DIAGNOSIS — A0472 Enterocolitis due to Clostridium difficile, not specified as recurrent: Secondary | ICD-10-CM | POA: Diagnosis not present

## 2021-01-15 DIAGNOSIS — I959 Hypotension, unspecified: Secondary | ICD-10-CM | POA: Diagnosis not present

## 2021-01-15 DIAGNOSIS — R197 Diarrhea, unspecified: Secondary | ICD-10-CM | POA: Diagnosis not present

## 2021-01-15 DIAGNOSIS — E860 Dehydration: Secondary | ICD-10-CM | POA: Diagnosis not present

## 2021-01-15 LAB — CBC WITH DIFFERENTIAL/PLATELET
Abs Immature Granulocytes: 0.48 10*3/uL — ABNORMAL HIGH (ref 0.00–0.07)
Basophils Absolute: 0 10*3/uL (ref 0.0–0.1)
Basophils Relative: 1 %
Eosinophils Absolute: 0.1 10*3/uL (ref 0.0–0.5)
Eosinophils Relative: 1 %
HCT: 27.5 % — ABNORMAL LOW (ref 36.0–46.0)
Hemoglobin: 9 g/dL — ABNORMAL LOW (ref 12.0–15.0)
Immature Granulocytes: 12 %
Lymphocytes Relative: 11 %
Lymphs Abs: 0.4 10*3/uL — ABNORMAL LOW (ref 0.7–4.0)
MCH: 27.6 pg (ref 26.0–34.0)
MCHC: 32.7 g/dL (ref 30.0–36.0)
MCV: 84.4 fL (ref 80.0–100.0)
Monocytes Absolute: 0.1 10*3/uL (ref 0.1–1.0)
Monocytes Relative: 4 %
Neutro Abs: 2.8 10*3/uL (ref 1.7–7.7)
Neutrophils Relative %: 71 %
Platelets: 60 10*3/uL — ABNORMAL LOW (ref 150–400)
RBC: 3.26 MIL/uL — ABNORMAL LOW (ref 3.87–5.11)
RDW: 21.7 % — ABNORMAL HIGH (ref 11.5–15.5)
Smear Review: UNDETERMINED
WBC: 3.9 10*3/uL — ABNORMAL LOW (ref 4.0–10.5)
nRBC: 0 % (ref 0.0–0.2)

## 2021-01-15 LAB — GLUCOSE, CAPILLARY
Glucose-Capillary: 55 mg/dL — ABNORMAL LOW (ref 70–99)
Glucose-Capillary: 113 mg/dL — ABNORMAL HIGH (ref 70–99)
Glucose-Capillary: 81 mg/dL (ref 70–99)
Glucose-Capillary: 93 mg/dL (ref 70–99)
Glucose-Capillary: 105 mg/dL — ABNORMAL HIGH (ref 70–99)
Glucose-Capillary: 139 mg/dL — ABNORMAL HIGH (ref 70–99)
Glucose-Capillary: 135 mg/dL — ABNORMAL HIGH (ref 70–99)

## 2021-01-15 LAB — DIGOXIN LEVEL
Digoxin Level: 0.9 ng/mL (ref 0.8–2.0)
Digoxin Level: 4.2 ng/mL (ref 0.8–2.0)
Digoxin Level: 3.2 ng/mL (ref 0.8–2.0)

## 2021-01-15 LAB — COMPREHENSIVE METABOLIC PANEL WITH GFR
ALT: 10 U/L (ref 0–44)
AST: 17 U/L (ref 15–41)
Albumin: 2.1 g/dL — ABNORMAL LOW (ref 3.5–5.0)
Alkaline Phosphatase: 455 U/L — ABNORMAL HIGH (ref 38–126)
Anion gap: 8 (ref 5–15)
BUN: 41 mg/dL — ABNORMAL HIGH (ref 8–23)
CO2: 23 mmol/L (ref 22–32)
Calcium: 7.4 mg/dL — ABNORMAL LOW (ref 8.9–10.3)
Chloride: 102 mmol/L (ref 98–111)
Creatinine, Ser: 2.01 mg/dL — ABNORMAL HIGH (ref 0.44–1.00)
GFR, Estimated: 27 mL/min — ABNORMAL LOW
Glucose, Bld: 150 mg/dL — ABNORMAL HIGH (ref 70–99)
Potassium: 3.1 mmol/L — ABNORMAL LOW (ref 3.5–5.1)
Sodium: 133 mmol/L — ABNORMAL LOW (ref 135–145)
Total Bilirubin: 1.2 mg/dL (ref 0.3–1.2)
Total Protein: 4 g/dL — ABNORMAL LOW (ref 6.5–8.1)

## 2021-01-15 LAB — MAGNESIUM: Magnesium: 2.2 mg/dL (ref 1.7–2.4)

## 2021-01-15 LAB — PROTEIN, BODY FLUID (OTHER): Total Protein, Body Fluid Other: 0.8 g/dL

## 2021-01-15 LAB — POTASSIUM
Potassium: 3.1 mmol/L — ABNORMAL LOW (ref 3.5–5.1)
Potassium: 4.6 mmol/L (ref 3.5–5.1)

## 2021-01-15 LAB — PHOSPHORUS: Phosphorus: 3.1 mg/dL (ref 2.5–4.6)

## 2021-01-15 IMAGING — DX DG CHEST 1V PORT
1 series · 1 of 1 positions shown · non-contrast
Comparison: Chest radiograph from one day prior.

CLINICAL DATA: Hypoxia

EXAM:
PORTABLE CHEST 1 VIEW

[chest ap]
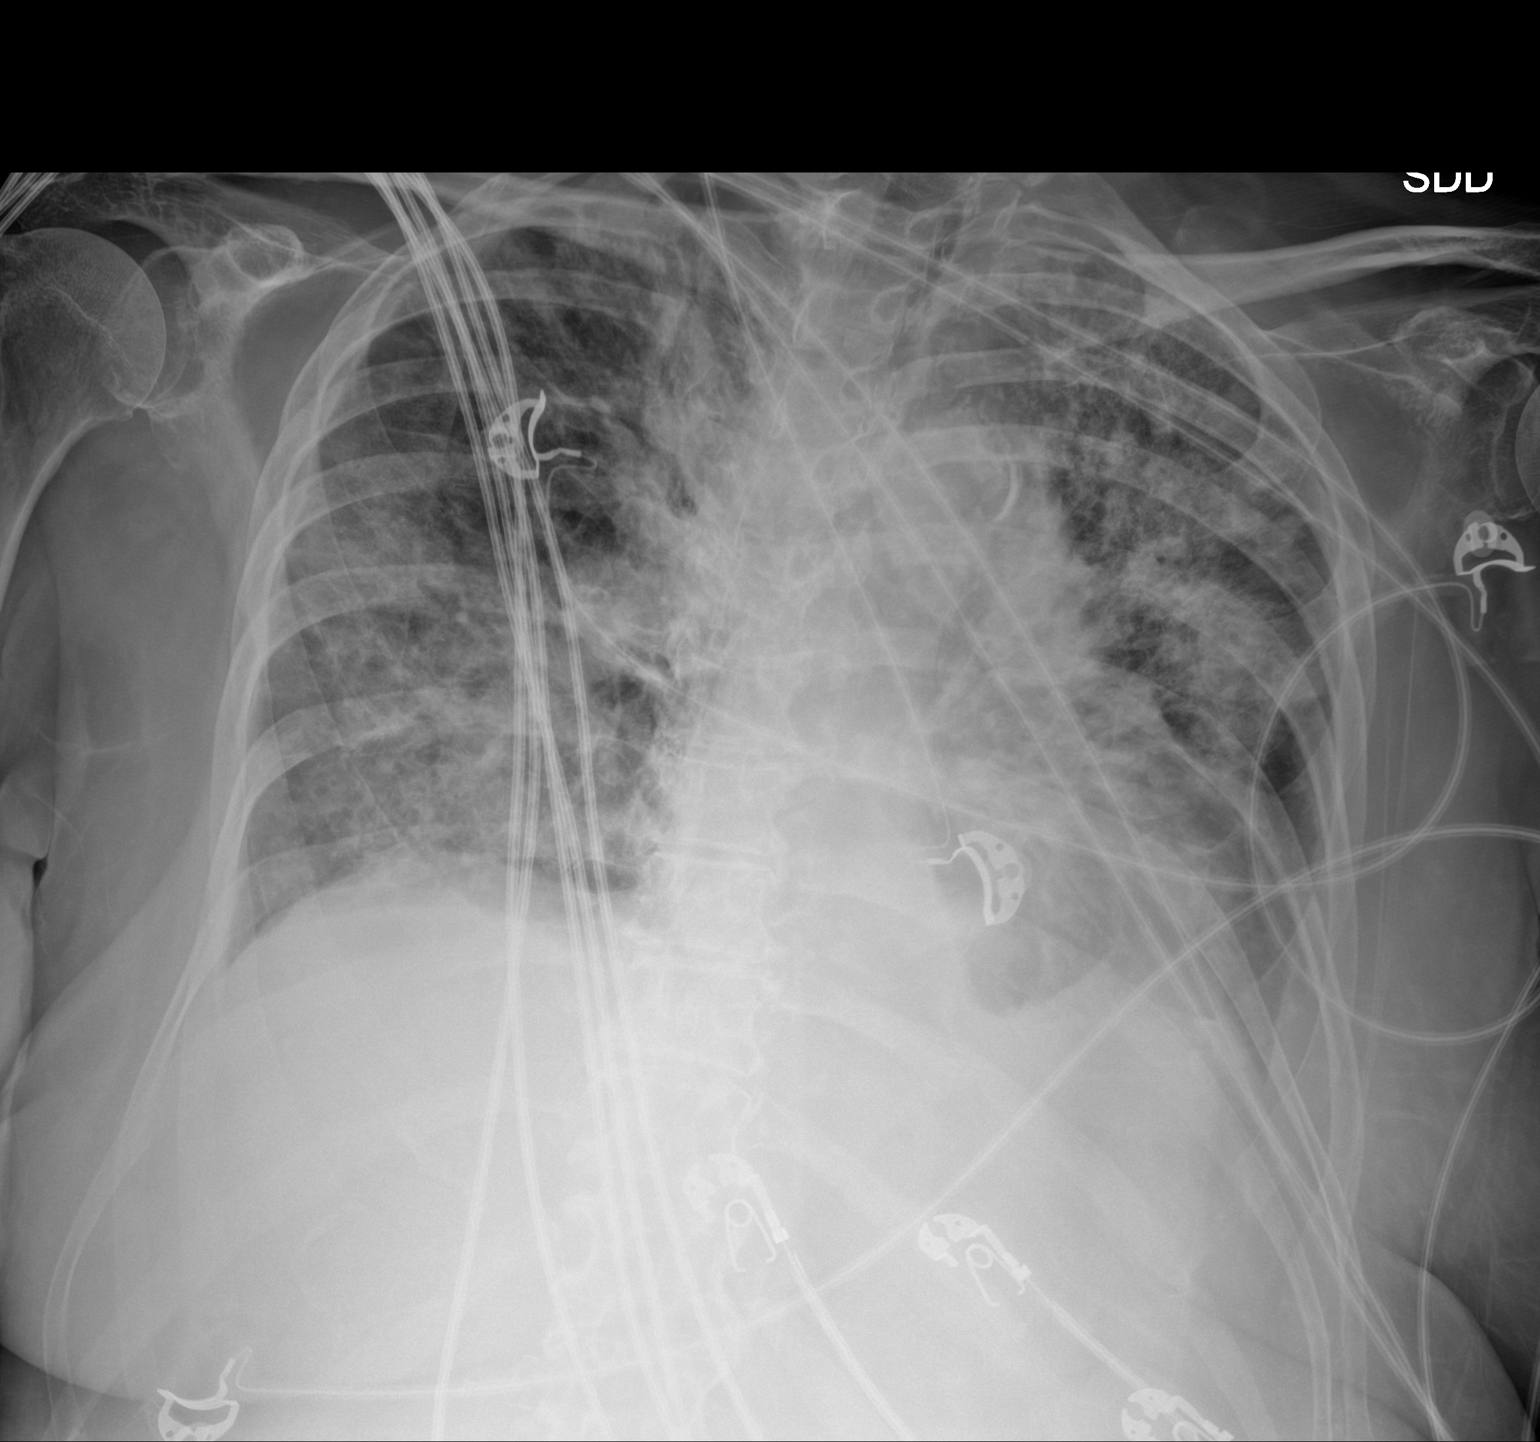

[1 of 1 positions shown; findings below may reference images not displayed]

FINDINGS: Stable cardiomediastinal silhouette with top-normal heart size. No
pneumothorax. Small left pleural effusion, slightly increased. No
significant right pleural effusion. Extensive patchy opacities
throughout both lungs, slightly worsened.
IMPRESSION: 1. Extensive patchy opacities throughout both lungs, slightly
worsened, compatible with multifocal pneumonia or ARDS.
2. Small left pleural effusion, slightly increased.

## 2021-01-15 MED ORDER — SODIUM CHLORIDE 0.9 % IV SOLN: 250.0000 mL | INTRAVENOUS | Status: DC | PRN

## 2021-01-15 MED ORDER — INSULIN ASPART 100 UNIT/ML IJ SOLN
0.0000 [IU] | Freq: Three times a day (TID) | INTRAMUSCULAR | Status: DC
Start: 2021-01-15 — End: 2021-01-16
  Administered 2021-01-16: 1 [IU] via SUBCUTANEOUS
  Filled 2021-01-15: qty 1

## 2021-01-15 MED ORDER — TRAVASOL 10 % IV SOLN
INTRAVENOUS | Status: DC
  Filled 2021-01-15: qty 316.8

## 2021-01-15 MED ORDER — POTASSIUM CHLORIDE CRYS ER 20 MEQ PO TBCR
40.0000 meq | EXTENDED_RELEASE_TABLET | Freq: Once | ORAL | Status: AC
  Administered 2021-01-15: 40 meq via ORAL
  Filled 2021-01-15: qty 2

## 2021-01-15 MED ORDER — DIGOXIN 125 MCG PO TABS
0.0625 mg | ORAL_TABLET | ORAL | Status: DC
  Administered 2021-01-15: 0.0625 mg via ORAL
  Filled 2021-01-15: qty 0.5

## 2021-01-15 MED ORDER — BUDESONIDE 0.5 MG/2ML IN SUSP
0.5000 mg | Freq: Two times a day (BID) | RESPIRATORY_TRACT | Status: DC
  Administered 2021-01-15 – 2021-01-16 (×3): 0.5 mg via RESPIRATORY_TRACT
  Filled 2021-01-15 (×4): qty 2

## 2021-01-15 MED ORDER — METHYLPREDNISOLONE SODIUM SUCC 40 MG IJ SOLR
40.0000 mg | Freq: Two times a day (BID) | INTRAMUSCULAR | Status: DC
  Administered 2021-01-15 (×2): 40 mg via INTRAVENOUS
  Filled 2021-01-15 (×2): qty 1

## 2021-01-15 MED ORDER — BOOST / RESOURCE BREEZE PO LIQD CUSTOM: 1.0000 | Freq: Two times a day (BID) | ORAL | Status: DC

## 2021-01-15 MED ORDER — SODIUM CHLORIDE 0.9% FLUSH
3.0000 mL | Freq: Two times a day (BID) | INTRAVENOUS | Status: DC
  Administered 2021-01-15: 3 mL via INTRAVENOUS

## 2021-01-15 MED ORDER — LACTATED RINGERS IV SOLN: INTRAVENOUS | Status: DC

## 2021-01-15 MED ORDER — FUROSEMIDE 10 MG/ML IJ SOLN
40.0000 mg | Freq: Once | INTRAMUSCULAR | Status: AC
  Administered 2021-01-15: 40 mg via INTRAVENOUS
  Filled 2021-01-15: qty 4

## 2021-01-15 MED ORDER — ATOVAQUONE 750 MG/5ML PO SUSP
750.0000 mg | Freq: Two times a day (BID) | ORAL | Status: DC
  Administered 2021-01-15 (×2): 750 mg via ORAL
  Filled 2021-01-15 (×4): qty 5

## 2021-01-15 MED ORDER — DEXMEDETOMIDINE HCL IN NACL 400 MCG/100ML IV SOLN
0.4000 ug/kg/h | INTRAVENOUS | Status: DC
  Administered 2021-01-15: 0.4 ug/kg/h via INTRAVENOUS
  Administered 2021-01-16: 0.2 ug/kg/h via INTRAVENOUS
  Filled 2021-01-15 (×2): qty 100

## 2021-01-15 MED ORDER — POTASSIUM CHLORIDE 10 MEQ/50ML IV SOLN
10.0000 meq | INTRAVENOUS | Status: AC
  Administered 2021-01-15 (×2): 10 meq via INTRAVENOUS
  Filled 2021-01-15 (×2): qty 50

## 2021-01-15 MED ORDER — SODIUM CHLORIDE 0.9% FLUSH: 3.0000 mL | INTRAVENOUS | Status: DC | PRN

## 2021-01-15 MED ORDER — SODIUM CHLORIDE 0.9 % IV SOLN
2.0000 g | INTRAVENOUS | Status: DC
  Administered 2021-01-15: 2 g via INTRAVENOUS
  Filled 2021-01-15 (×2): qty 2

## 2021-01-15 MED ORDER — PROSOURCE PLUS PO LIQD
30.0000 mL | Freq: Two times a day (BID) | ORAL | Status: DC
  Administered 2021-01-15: 30 mL via ORAL
  Filled 2021-01-15 (×4): qty 30

## 2021-01-15 MED ORDER — DEXTROSE 50 % IV SOLN
INTRAVENOUS | Status: AC
  Administered 2021-01-15: 50 mL via INTRAVENOUS
  Filled 2021-01-15: qty 50

## 2021-01-15 MED ORDER — SODIUM CHLORIDE 0.9 % IV SOLN
2.0000 | Freq: Once | INTRAVENOUS | Status: AC
  Administered 2021-01-15: 2 via INTRAVENOUS
  Filled 2021-01-15: qty 80

## 2021-01-15 MED ORDER — IPRATROPIUM-ALBUTEROL 0.5-2.5 (3) MG/3ML IN SOLN
3.0000 mL | RESPIRATORY_TRACT | Status: DC
  Administered 2021-01-15 – 2021-01-16 (×7): 3 mL via RESPIRATORY_TRACT
  Filled 2021-01-15 (×8): qty 3

## 2021-01-15 MED ORDER — DEXTROSE IN LACTATED RINGERS 5 % IV SOLN
INTRAVENOUS | Status: AC
Start: 2021-01-15 — End: 2021-01-15

## 2021-01-15 NOTE — Progress Notes (Signed)
PT Cancellation Note  Patient Details Name: Lauren Bradshaw MRN: [id number] DOB: 12/02/1950   Cancelled Treatment:    Reason Eval/Treat Not Completed: Patient not medically ready Spoke with nurse who reports MD requests to hold therapy today as pt is not doing well in regards to respiratory status & has recently been started on precedex. Will f/u as able & as pt is medically appropriate for PT intervention.  Aleda Grana, PT, DPT 01/15/21, 11:06 AM    Sandi Mariscal 01/15/2021, 11:05 AM

## 2021-01-15 NOTE — Consult Note (Signed)
Pharmacy Antibiotic Note  Lauren Bradshaw is a 70 y.o. female admitted on 2021-01-15 with  Febrile Neutropenia .  Pharmacy has been consulted for Cefepime dosing.  Plan: PCT 71.6>45.1>19.72; WBC 1.1 (ANC 0.4) >> now 3.9 (ANC 2.8) Initiate cefepime 2g IV q24h per d/w MD Pt also receiving Tx for fulminant Cdiff colitis w/ Vancocin 500mg  PO QID & Flagyl 500mg  IV q8h. Atovaquone started 11/6 750mg  PO BID d/t c/f PJP  Height: 5\' 4"  (162.6 cm) Weight: 54.7 kg (120 lb 9.5 oz) IBW/kg (Calculated) : 54.7  Temp (24hrs), Avg:97.5 F (36.4 C), Min:96.4 F (35.8 C), Max:98.3 F (36.8 C)  Recent Labs  Lab 01/10/21 2032 01/11/21 0450 01/11/21 0930 01/11/21 1320 01/11/21 1649 01/12/21 0330 01/13/21 0306 01/14/21 0536 01/15/21 0412  WBC  --  2.5*  --   --   --  5.1 7.8 5.7 3.9*  CREATININE  --  1.61*  --   --   --  1.82* 1.88* 2.17* 2.01*  LATICACIDVEN 4.2*  --  2.1* 2.1* 1.7  --  1.2  --   --     Estimated Creatinine Clearance: 23.1 mL/min (A) (by C-G formula based on SCr of 2.01 mg/dL (H)).    Allergies  Allergen Reactions   Codeine Shortness Of Breath   Heparin Other (See Comments)    HIT work-up in process   Sulfa Antibiotics Shortness Of Breath    Antimicrobials this admission: Cefepime (11/6 >>  Atovaquone (11/6 >>   Dose adjustments this admission: CTM and adjust PRN  Microbiology results: 10/31 Cdiff AG+/Tox+ 10/31 Cov/Flu/MRSA: negative 10/31 PCR GIP - negative 10/31 BCx: NG@5D  11/01 UCx: NGTD  11/04 Pleural fluid: NG@2D     Thank you for allowing pharmacy to be a part of this patient's care.  11/31 Lauren Bradshaw 01/15/2021 11:23 AM

## 2021-01-15 NOTE — Progress Notes (Signed)
Pharmacy Heparin Induced Thrombocytopenia (HIT) Note:  Lauren Bradshaw is an 70 y.o. female being evaluated for HIT. Heparin was started 11/2 for DVT prophylaxis, and baseline platelets were 156.   HIT labs were ordered on 11/6 when platelets dropped to 60.  Auto-populate labs: No results found for: HEPINDPLTAB, SRALOWDOSEHP, SRAHIGHDOSEH   CALCULATE SCORE:  4Ts (see the HIT Algorithm) Score  Thrombocytopenia 2 (nadir not yet identified)  Timing 2  Thrombosis 0 (none apparent per chart review)  Other causes of thrombocytopenia 1 (sepsis)  Total 5 (intermediate probability ~14%)    Recommendations (A or B) are based on available lab results (HIT antibody and/or SRA) and the HIT algorithm    A. HIT antibody result available  HIT antibody pending   B. SRA result availability  SRA will be ordered based on HIT antibody results   Name of MD Contacted: Dr. Belia Heman  Plan (Discussed with provider) Labs ordered:  HIT antibody ordered Heparin allergy:  Heparin allergy documented or updated.  Anticoagulation plans:  Discontinue heparin / LMWH   Comments (List any alternative plans or if there are contraindications to therapy)   [name] 01/15/2021, 10:48 AM

## 2021-01-15 NOTE — Progress Notes (Signed)
NAME:  Lauren Bradshaw, MRN:  [id number], DOB:  [birth year], LOS: 0 ADMISSION DATE:  01/08/2021 CONSULTATION DATE:  12/20/2020             REFERRING MD:  Dr. Lenard Lance CHIEF COMPLAINT:  Weakness, lethargy and abdominal pain     BRIEF SYNOPSIS:  Lauren Bradshaw is a 70 y/o F who presented to [facility] ED for weakness, lethargy, diarrhea, and abdominal pain after recent d/c from rehab for c.diff, Afib, PNA, and ANCA renal vasculitis. Patient placed in ICU care for hemodynamic instability secondary to hypovolemic septic shock d/t pancolitis with multiorgan failure with resp distress, renal failure, cardiac failure with afib, severe leukopenia with severe metabolic acidosis and encephalopathy.   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 Diff. (She was meant to FU with both renal and pulmonary specialists this week to determine further plan of care.)     Pertinent  Medical History  Atrial fibrillation  C. Diff HTN HDL ANCA renal vasculitis  PNA w/ respiratory failure  Hypothyroidism    Significant Hospital Events: Including procedures, antibiotic start and stop dates in addition to other pertinent events   ED course: Code stroke activated, CTH obtained, but further testing deferred d/t hemodynamic instability. Patient was given amiodarone bolus for AFIB w/ RVR.    10/31>> The critical care team was consulted for hemodynamic instability. Patient was seen in ED bed, appears to be critically ill - complaining of abdominal pain, CT abdomen/pelvis ordered. Will be transferred to critical care for further workup and treatment.  11/1>> CT abdomen/pelvis confirms pancolitis. Surgery on board for possible colectomy  with end iliostomy if worsening condition or increasing lactate. Continue treatment for sepsis and supportive care.  11/2>> Continue to trend lactate. Continue antibiotic treatment, pressure support as needed. No fever overnight. Increasing WBCs and neutrophils. Kidney function stable.  11/3>> Patient feeling better. Abdominal tenderness improved. Kidney function slightly worse. Continue abx treatment to avoid surgery. Increasing WBC count.  11/4>> Developed SOB and hypoxia overnight requiring 15L Hi-Flo O2. Suspected pulmonary edema d/t fluid overload in setting of impaired kidney function. Diuretics given.  CXR shows progressive worsening of b/l infiltrates(11/4) 11/4 US guided thoracentesis-549ml's removed, tried esmolol, tried digoxin, HR 130's 11/5 remains on in afib, resp distress, hypoxia, nephrology consulted 11/6 remains hypoxic, digoxin toxicity noted +renal failure  Micro Data:  10/31>> Blood culture pending (no growth 3 days) 10/31>> Stool PCR - positive for C.Diff    Antimicrobials:  10/31>> metronidazole, vancomycin, cefepime (1x dose)  11/1>> metronidazole, PO vancomycin    Interim History / Subjective:  +SOB +hypoixa Alert and awake Nephrology consulted Tried multiple drugs for afib, esmolol, digoxin reversed with digibind last night due to toxic levels  Abd tenderness improving LA improving Follow up general surgery recs HR's down to 120's with digoxin    Objective   Blood pressure (!) 106/57, pulse (!) 124, temperature (!) 97.5 F (36.4 C), temperature source Oral, resp. rate (!) 32, height 5\' 4"  (1.626 m), weight 54.7 kg, SpO2 97 %.    >         Intake/Output Summary (Last 24 hours) at 12/13/2020 1642 Last data filed at 12/15/2020 1639    Gross per 24 hour  Intake  2500.67 ml  Output --  Net 2500.67 ml       Filed Weights    01/02/2021 1013  Weight: 54.7 kg      REVIEW OF SYSTEMS Review of Systems  Constitutional:  Positive for malaise/fatigue.   Respiratory:  Positive for shortness of breath. Negative for cough.   Cardiovascular:  Negative for chest pain.  Gastrointestinal:  Positive for abdominal pain. Negative for nausea and vomiting.  Neurological:  Negative for headaches.  Endo/Heme/Allergies:  Bruises/bleeds easily.    PHYSICAL EXAMINATION:  GENERAL:critically ill appearing, +resp distress EYES: Pupils equal, round, reactive to light.  No scleral icterus.  MOUTH: Moist mucosal membrane.  NECK: Supple.  PULMONARY: +rhonchi,  CARDIOVASCULAR: S1 and S2.  No murmurs  GASTROINTESTINAL: Soft, nontender, -distended. Positive bowel sounds.  MUSCULOSKELETAL: +edema.  NEUROLOGIC: alert and awake SKIN:intact,warm,dry     Labs/imaging that I havepersonally reviewed  (right click and "Reselect all SmartList Selections" daily)  10/31>> CTH- There is no acute intracranial hemorrhage or evidence of acute infarction. ASPECT score is 10.  10/31 >> CXR- New airspace opacity in the right suprahilar lung likely due to pneumonia. Radiographic follow-up to resolution is recommended to exclude developing mass. 10/31 >> CT abdomen/pelvis - pancolitis. Left renal subscapular fluid collection, likely hematoma d/t recent renal biopsy.  11/1>> CXR- persistent medial right upper lobe airspace consolidation concerning for pneumonia. Followup PA and lateral chest X-ray is recommended in 3-4 weeks following trial of antibiotic therapy to ensure resolution and exclude underlying malignancy. 11/1>> [place] BLE- No evidence of DVT within either lower extremity. 11/1>> Echo- EF 60-65%, mild LAE (overall normal heart function) 11/2>> KUB - nonobstructive bowel gas pattern. No free intraperitoneal air seen.  11/2>> MR Brain- tiny acute infarct in right frontal lobe. Moderate chronic microvascular ischemic disease.  11/2>> MR T-spine- unremarkable. Mod-sized bilateral pleural effusions.  11/4>> CXR- The appearance the chest is concerning for severely  worsening multilobar bilateral pneumonia. Probable trace bilateral pleural effusions. 11/5 CT chest/ABD/PELVIS-b/l infiltrates   Resolved Hospital Problem list       ASSESSMENT AND PLAN   SYNOPSIS: Lauren Bradshaw is a 70 y/o F who presented to [facility] ED for weakness, lethargy, diarrhea, and abdominal pain after recent d/c from rehab for c.diff, Afib, PNA, and ANCA renal vasculitis. Patient placed in ICU care for hemodynamic instability secondary to hypovolemic septic shock d/t pancolitis with multiorgan failure with resp distress, renal failure, cardiac failure with afib, hypoglycemia, severe leukopenia with severe metabolic acidosis and encephalopathy.    Severe ACUTE Hypoxic and Hypercapnic Respiratory Failure CT chest reviewed Start IV steroids NEB therapy and LASIX 40 mg IVx 1  High risk for intubation US thoracentesis removed 500 ml's FiO2 (%):  [55 %-65 %] 65 %  SEPTIC shock SOURCE-PANCOLITIS AND C DIFF colitis -use vasopressors to keep MAP>65 as needed Continue PO vanc & flagyl Stress-dose Solu-Cortef  Lactate: 6.4-->5.3-->4.2-->2.1-->1.7 -->1.2     GI-ABD pain and C diff Pancolitis GI PROPHYLAXIS as indicated  NUTRITIONAL STATUS DIET-->oral feeds as tolerated Per surgery - will try to treat pancolitis with aggressive abx to avoid surgery. 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  - Zofran & fentanyl as needed for pain control   Severe ACUTE Hypoxic  Respiratory Failure Possible pulmonary edema vs. persistent PNA vs. Amiodarone lung toxicity Weaning fio2 as needed and tolerated Nebs as prescribed    ELECTROLYTES -follow labs as needed -replace as needed -pharmacy consultation and following  ACUTE ANEMIA- TRANSFUSE AS NEEDED CONSIDER TRANSFUSION  IF HGB<7 DVT PRX with TED/SCD's ONLY  CBC    Component Value Date/Time   WBC 3.9 (L) 01/15/2021 0412   RBC 3.26 (L) 01/15/2021 0412   HGB 9.0 (L) 01/15/2021 0412   HCT 27.5 (L) 01/15/2021 0412   PLT 60  (L) 01/15/2021 0412   MCV 84.4 01/15/2021 0412   MCH 27.6 01/15/2021 0412   MCHC 32.7 01/15/2021 0412   RDW 21.7 (H) 01/15/2021 0412   LYMPHSABS 0.4 (L) 01/15/2021 0412   MONOABS 0.1 01/15/2021 0412   EOSABS 0.1 01/15/2021 0412   BASOSABS 0.0 01/15/2021 0412   ACUTE  CARDIAC FAILURE- Afib with RVR Does not tolerate Ca blockers or beta blockers Digoxin tried last night, concerned about renal function Follow up recs pending Digoxic given but reversed Will dose according to kidney function   ACUTE KIDNEY INJURY/Renal Failure -continue Foley Catheter-assess need -Avoid nephrotoxic agents -Follow urine output, BMP -Ensure adequate renal perfusion, optimize oxygenation -Renal dose medications   Intake/Output Summary (Last 24 hours) at 01/15/2021 0752 Last data filed at 01/14/2021 1800 Gross per 24 hour  Intake 796.91 ml  Output 190 ml  Net 606.91 ml   BMP Latest Ref Rng & Units 01/15/2021 01/15/2021 01/14/2021  Glucose 70 - 99 mg/dL 150(H) - -  BUN 8 - 23 mg/dL 41(H) - -  Creatinine 0.44 - 1.00 mg/dL 2.01(H) - -  Sodium 135 - 145 mmol/L 133(L) - -  Potassium 3.5 - 5.1 mmol/L 3.1(L) 3.1(L) 3.3(L)  Chloride 98 - 111 mmol/L 102 - -  CO2 22 - 32 mmol/L 23 - -  Calcium 8.9 - 10.3 mg/dL 7.4(L) - -    Hypothyroidism  - Levothyroxine 75 mcg once a day   Best practice (right click and "Reselect all SmartList Selections" daily)  Diet: NPO Pain/Anxiety/Delirium protocol (if indicated): No VAP protocol (if indicated): Not indicated DVT prophylaxis: SCDs GI prophylaxis: PPI Glucose control:  SSI No Central venous access:  N/A Arterial line:  N/A Foley:  N/A Mobility:  bed rest  Code Status:  FULL Disposition: ICU     DVT/GI PRX  assessed I Assessed the need for Labs I Assessed the need for Foley I Assessed the need for Central Venous Line Family Discussion when available I Assessed the need for Mobilization I made an Assessment of medications to be adjusted  accordingly Safety Risk assessment completed  CASE DISCUSSED IN MULTIDISCIPLINARY ROUNDS WITH ICU TEAM     Critical Care Time devoted to patient care services described in this note is 60 minutes.  Critical care was necessary to treat /prevent imminent and life-threatening deterioration. Overall, patient is critically ill, prognosis is guarded.  Patient with Multiorgan failure and at high risk for cardiac arrest and death.    Corrin Parker, M.D.  [facility]  Medical Director [place] Director [facility] Cardio-Pulmonary Department

## 2021-01-15 NOTE — Progress Notes (Signed)
Initial Nutrition Assessment RD working remotely.  DOCUMENTATION CODES:   Not applicable  INTERVENTION:  - will order Boost Breeze BID, each supplement provides 250 kcal and 9 grams of protein. - will order 30 ml Prosource Plus BID, each supplement provides 100 kcal and 15 grams protein.  - TPN initiation and advancement per Pharmacist. - weigh patient today.  - diet advancement as medically feasible. - complete NFPE when feasible. - recommend 100 mg oral thiamine/day.   Monitor magnesium, potassium, and phosphorus BID for at least 3 days, MD to replete as needed, as pt is at risk for refeeding syndrome given inadequate nutrition for >/= 1 week, current hypokalemia.   NUTRITION DIAGNOSIS:   Inadequate oral intake related to acute illness, diarrhea as evidenced by per patient/family report.  GOAL:   Patient will meet greater than or equal to 90% of their needs  MONITOR:   PO intake, Supplement acceptance, Diet advancement, Labs, Weight trends, I & O's, Other (Comment) (TPN regimen)  REASON FOR ASSESSMENT:   Consult New TPN/TNA  ASSESSMENT:   70 year old female with medical history of afib, c. Diff., HTN, HDL, and vasculitis. She presented to the ED due to progressive weakness, lethargy, severe diarrhea, and abdominal pain. She was recently discharged from rehab at [facility] and has been in and out of the hospital since the beginning of August. She had decreased water and food intake PTA.  Page received from Pharmacist about patient and consult for TPN. Able to talk with Pharmacist during that time and alert to refeeding risk.  Patient is currently out of the room to CT. She has not been seen by a [place] RD at any time in the past.   She is noted to have a triple lumen CVC in L femoral which was placed on 10/31. She has not had an NGT at any time during hospitalization.   Dr. Zoila Shutter note from today at 1117 read. Plan for TPN and high risk for intubation d/t increased WOB  and using accessory muscles to breath.   Patient was NPO from 10/31-11/4 at 1434 when diet advanced to CLD. No intakes documented since that time. In H&P it indicates that patient reported poor/decreased oral intake PTA also; unknown time frame.   Weight on 10/31 was 120 lb. No other weights recorded in the chart. She is noted to have generalized moderate pitting edema per documentation in edema section of flow sheet.    Labs reviewed; CBGs: 55, 113, 81, 93 mg/dl, Na: 133 mmol/l, K: 3.1 mmol/l, BUN: 41 mg/dl, creatinine: 2.01 mg/dl, Ca: 7.4 mg/dl, Alk Phos elevated, GFR: 27 ml/min.   Medications reviewed; 40 mg IV lasix x1 dose 11/6, 40 mg solu-medrol BID, 40 mg IV protonix/day, 10 mEq IV KCl x2 runs 11/6, 40 mEq Klor-Con x1 dose 11/5 and x1 dose 11/6.  IVF; D5 @ 30 ml/hr (122 kcal/24 hrs).     NUTRITION - FOCUSED PHYSICAL EXAM:  Unable to complete at this time.   Diet Order:   Diet Order             Diet clear liquid Room service appropriate? Yes; Fluid consistency: Thin  Diet effective now                   EDUCATION NEEDS:   Not appropriate for education at this time  Skin:  Skin Assessment: Reviewed RN Assessment  Last BM:  11/6 (type 7 x1, large amount  Height:   Ht Readings from Last  1 Encounters:  12/13/2020 '5\' 4"'  (1.626 m)    Weight:   Wt Readings from Last 1 Encounters:  12/14/2020 54.7 kg     Estimated Nutritional Needs:  Kcal:  1650-1915 kcal Protein:  85-100 grams Fluid:  >/= 2.3 L/day      Jarome Matin, MS, RD, LDN, CNSC Inpatient Clinical Dietitian RD pager # available in AMION  After hours/weekend pager # available in [facility]

## 2021-01-15 NOTE — Progress Notes (Signed)
GOALS OF CARE DISCUSSION  The Clinical status was relayed to family in detail. Daughter In Scientist, product/process development Updated and notified of patients medical condition.    Patient with increased WOB and using accessory muscles to breathe Explained to family course of therapy and the modalities  CT CHEST reviewed Patient is suffocating Progressive hypoxia  Start THerapy for PCP(steroids and atovoquone) Start PRECEDEX for anxiety Patient states allergic to codeine and sulfa drugs Start TPN Add GRAM NEG coverage with cefepime High risk for intubation    Patient with Progressive multiorgan failure with a very high probablity of a very minimal chance of meaningful recovery despite all aggressive and optimal medical therapy.  PATIENT REMAINS FULL CODE   Family are satisfied with Plan of action and management. All questions answered  Additional CC time 35 mins   Hamid Brookens Santiago Glad, M.D.  [facility]  Medical Director [facility] Medical Director [facility] Cardio-Pulmonary Department

## 2021-01-15 NOTE — Progress Notes (Addendum)
Chronic / Paroxysmal Atrial Fibrillation  Digoxin level continues to increase, 6.1 > 6.4, patient remains hemodynamically stable & asymptomatic. However, due to increasing digoxin level in the setting of worsening renal function decision made to administer Digibind. Discussed with Pharmacist for dosage recommendations.  - 2 vials of Digibind ordered - f/u digoxin level & K+ 3 hours after administration - consider additional doses of Digibind as needed - Cardiology consulted, appreciate input - Continuous cardiac monitoring  Additional CC time 30 minutes  Betsey Holiday, AGACNP-BC Acute Care Nurse Practitioner [facility]   [phone] / [phone] Please see Amion for pager details.

## 2021-01-15 NOTE — Consult Note (Signed)
PHARMACY - TOTAL PARENTERAL NUTRITION CONSULT NOTE   Indication:  Intolerance to enteral nutrition  Patient Measurements: Height: 5\' 4"  (162.6 cm) Weight: 54.7 kg (120 lb 9.5 oz) IBW/kg (Calculated) : 54.7   Body mass index is 20.7 kg/m.  Assessment:  Patient is a 70 y/o F with medical history including Afib, C difficile infection, HTN, HLD, and ANCA vasculitis who was admitted on 12/31/2020 with fulminant C difficile infection (recurrence vs initial treatment failure). General surgery is following and there is no plan for surgical intervention at this point. Admission complicated by respiratory failure secondary to fluid overload / pneumonia, Afib with RVR, and acute on chronic kidney disease. RD consulted and following. Pharmacy consulted to initiate TPN given prolonged intolerance to enteral nutrition.  Glucose / Insulin: Hypoglycemia Electrolytes: Hyponatremia, hypokalemia Renal: AKI on CKD Hepatic: Overall normal. Elevated alkaline phosphatase Intake / Output; MIVF: D5LR at 30 mL/hr. I&O: +6.7L GI Imaging: 10/31 CT abdomen / pelvis: Severe circumferential wall thickening of the entire colon consistent with pancolitis. 8.7 cm low-density left renal subcapsular fluid collection, concerning for abscess. Trace ascites.  Small bilateral pleural effusions 11/5 CT abdomen / pelvis: Increase in ascites when compared with the recent exam although this may simply be reactive in nature given the changes in the colon. Diffuse wall thickening is noted throughout the colon consistent with the given clinical history of colitis. No perforation is identified. Subcapsular fluid collection along the posterior aspect of the left kidney stable in appearance from the recent exam. Possibility of perinephric abscess could not be totally excluded. GI Surgeries / Procedures: N/A  Central access: CVC 10/31 TPN start date: 11/6  Nutritional Goals: Goal TPN rate is 99 mL/hr (provides 95 g of protein and 1943  kcals per day)  RD Assessment: Estimated Needs Total Energy Estimated Needs: 1650-1915 kcal Total Protein Estimated Needs: 85-100 grams Total Fluid Estimated Needs: >/= 2.3 L/day  Current Nutrition:  Clear liquids  Plan:  --Start TPN at 33 mL/hr (1/3 rate) at 1800 Protein: 31.68 g Glucose: 95 g Lipids: 19.8g Kcal: 647 Fluids: 792 mL --Electrolytes in TPN: Na 53mEq/L, K 58mEq/L, Ca 11mEq/L, Mg 10mEq/L, and Phos 73mmol/L. Cl:Ac 1:1 --Add standard MVI and trace elements to TPN. Add thiamine 100 mg x 3 days for re-feed risk (day # 1 / 3) --Initiate Sensitive q8h SSI and adjust as needed  --Continue MIVF to 30 mL/hr at 1800. Will change to LR only upon initiation of TPN --Monitor TPN labs on Mon/Thurs, daily until stable  12m 01/15/2021,11:26 AM

## 2021-01-15 NOTE — Progress Notes (Signed)
OT Cancellation Note  Patient Details Name: Lauren Bradshaw MRN: [id number] DOB: Apr 29, 1950   Cancelled Treatment:    Reason Eval/Treat Not Completed: Medical issues which prohibited therapy Upon speaking with RN this AM, he informs therapy team that pt has been having respiratory difficulties today and has also been started on precedex. Requests hold at this time. Will f/u for OT tx at later date/time as able/appropriate. Thank you.  Rejeana Brock, MS, OTR/L ascom [phone] 01/15/21, 12:45 PM

## 2021-01-15 NOTE — Progress Notes (Signed)
CC: C diff Subjective: Mains issue is dyspnea ? HIT Still dyspnea ABd pain improving and tolerating CLD CT from yesterday no free air , abscess or megacolon TPN to start  Objective: Vital signs in last 24 hours: Temp:  [97.6 F (36.4 C)-98.3 F (36.8 C)] 97.6 F (36.4 C) (11/06 1200) Pulse Rate:  [47-115] 87 (11/06 1300) Resp:  [21-43] 34 (11/06 1300) BP: (80-138)/(53-115) 86/62 (11/06 1300) SpO2:  [86 %-95 %] 92 % (11/06 1300) FiO2 (%):  [55 %-68 %] 65 % (11/06 1523) Weight:  [57.8 kg] 57.8 kg (11/06 1100) Last BM Date: 01/15/21  Intake/Output from previous day: 11/05 0701 - 11/06 0700 In: 796.9 [P.O.:480; I.V.:16.9; IV Piggyback:300] Out: 190 [Urine:190] Intake/Output this shift: Total I/O In: 499.9 [I.V.:144.1; IV Piggyback:355.8] Out: 350 [Urine:350]  Physical exam: She is dyspneic, chronically ill Chest: decrease BS bilaterally wearing high flow O2 Cardiac: a fib HR 100s Abd: soft, no tenderness w/o peritonitis   Lab Results: CBC  Recent Labs    01/14/21 0536 01/15/21 0412  WBC 5.7 3.9*  HGB 9.2* 9.0*  HCT 26.8* 27.5*  PLT 94* 60*   BMET Recent Labs    01/14/21 0536 01/14/21 1836 01/15/21 0412 01/15/21 1453  NA 131*  --  133*  --   K 3.6   < > 3.1*  3.1* 4.6  CL 97*  --  102  --   CO2 22  --  23  --   GLUCOSE 78  --  150*  --   BUN 38*  --  41*  --   CREATININE 2.17*  --  2.01*  --   CALCIUM 7.6*  --  7.4*  --    < > = values in this interval not displayed.   PT/INR No results for input(s): LABPROT, INR in the last 72 hours. ABG No results for input(s): PHART, HCO3 in the last 72 hours.  Invalid input(s): PCO2, PO2  Studies/Results: DG Chest Port 1 View  Result Date: 01/15/2021 CLINICAL DATA:  Hypoxia EXAM: PORTABLE CHEST 1 VIEW COMPARISON:  Chest radiograph from one day prior. FINDINGS: Stable cardiomediastinal silhouette with top-normal heart size. No pneumothorax. Small left pleural effusion, slightly increased. No significant  right pleural effusion. Extensive patchy opacities throughout both lungs, slightly worsened. IMPRESSION: 1. Extensive patchy opacities throughout both lungs, slightly worsened, compatible with multifocal pneumonia or ARDS. 2. Small left pleural effusion, slightly increased. Electronically Signed   By: Ilona Sorrel M.D.   On: 01/15/2021 07:09   DG Chest Port 1 View  Result Date: 01/14/2021 CLINICAL DATA:  Worsening hypoxia.  Pancolitis. EXAM: PORTABLE CHEST 1 VIEW COMPARISON:  01/13/2021 FINDINGS: Heart size remains within normal limits. Aortic atherosclerotic calcification noted. Increased heterogeneous airspace disease is seen bilaterally. Multiple ill-defined nodular opacities are also noted, which appear new. This raises possibility of septic emboli. No evidence of pleural effusion. IMPRESSION: Increased bilateral heterogeneous airspace disease with ill-defined nodular opacities. This raises possibility of septic emboli. Consider chest CTA for further evaluation. These results will be called to the ordering clinician or representative by the Radiologist Assistant, and communication documented in the PACS or [employer]. Electronically Signed   By: Marlaine Hind M.D.   On: 01/14/2021 10:39   DG Abd Portable 2V  Result Date: 01/14/2021 CLINICAL DATA:  70 year old female with colitis EXAM: PORTABLE ABDOMEN - 2 VIEW COMPARISON:  01/13/2021, abdominal CT 12/16/2020 FINDINGS: Overlying EKG leads somewhat limit the evaluation. Relative paucity of bowel gas, with paucity of gas in the  stomach small bowel and colon. Trace gas present within left and right colon. No abnormal distension or air-fluid level. No unexpected radiopaque foreign body. Vascular calcifications present. Metallic coils project over the left renal silhouette similar to the prior CT. Left femoral vascular line. Degenerative changes of the spine, hips, bilateral sacroiliac joints. No acute displaced fracture. IMPRESSION: Nonspecific bowel gas  pattern, with no definite evidence of obstruction. Left femoral vascular line Electronically Signed   By: Corrie Mckusick D.O.   On: 01/14/2021 10:48   CT CHEST ABDOMEN PELVIS WO CONTRAST  Result Date: 01/14/2021 CLINICAL DATA:  C difficile colitis and findings suspicious for pneumonia. EXAM: CT CHEST, ABDOMEN AND PELVIS WITHOUT CONTRAST TECHNIQUE: Multidetector CT imaging of the chest, abdomen and pelvis was performed following the standard protocol without IV contrast. COMPARISON:  Chest and abdominal film from earlier in the same day as well as CT from 12/25/2020 and 09/08/2020. FINDINGS: CT CHEST FINDINGS Cardiovascular: Somewhat limited due to lack of IV contrast. Aortic calcifications are noted without aneurysmal dilatation. No cardiac enlargement is seen. Coronary calcifications are noted. Minimal pericardial effusion is noted slightly increased from the prior exam. Mediastinum/Nodes: Thoracic inlet is within normal limits. No sizable hilar or mediastinal adenopathy is noted. The esophagus is well visualized and there is a moderate-sized sliding-type hiatal hernia identified. Lungs/Pleura: Lungs demonstrate patchy airspace opacity throughout both lungs with evidence of bronchiectatic changes throughout both lungs. These have worsened significantly in the interval from the prior exam from June of 2022. Previously seen nodular changes are not well appreciated due to the overlying infiltrates. Effusions right considerably greater than left are seen as well. No pneumothorax is noted. Musculoskeletal: No acute rib abnormality is noted. Degenerative changes of the thoracic spine are noted. CT ABDOMEN PELVIS FINDINGS Hepatobiliary: Liver shows no discrete mass lesion. Vicarious excretion of contrast is noted within the gallbladder from prior exams. Mild perihepatic ascites is noted. Pancreas: Unremarkable. No pancreatic ductal dilatation or surrounding inflammatory changes. Spleen: Normal in size without focal  abnormality. Adrenals/Urinary Tract: Adrenal glands are within normal limits. Right kidney shows no renal calculi or obstructive changes. Left kidney demonstrates a 4 mm lower pole stones stable from the prior exam. No obstructive changes are seen. There is a fluid collection in the noted along the posterior aspect of the left kidney measuring 5.1 cm in greatest dimension and predominately stable from the prior exam. This may simply represent a focal cyst although the possibility of sub capsular fluid collection deserves consideration although the overall appearance is stable. Bladder is decompressed by Foley catheter. Stomach/Bowel: Colon shows diffuse wall thickening consistent with the given clinical history of C difficile colitis. No obstructive changes are noted. No findings to suggest perforation are seen. Small bowel and stomach appear within normal limits aside from the previously mentioned hiatal hernia. Vascular/Lymphatic: Aortic atherosclerosis. No enlarged abdominal or pelvic lymph nodes. Right femoral venous line is noted. Reproductive: Status post hysterectomy. Right adnexal cyst is noted stable in appearance from the recent exam. Other: Ascites is noted throughout the abdomen increased from the recent examination of 6 days previous. This may simply be reactive in nature. Musculoskeletal: No acute or significant osseous findings. IMPRESSION: CT of the chest: Bronchiectatic changes with diffuse bilateral multifocal infiltrates consistent with multifocal pneumonia. Bilateral pleural effusions right greater than left are seen. CT of the abdomen and pelvis: Increase in ascites when compared with the recent exam although this may simply be reactive in nature given the changes in the colon. Diffuse  wall thickening is noted throughout the colon consistent with the given clinical history of colitis. No perforation is identified. Hiatal hernia. Subcapsular fluid collection along the posterior aspect of the left  kidney stable in appearance from the recent exam. Possibility of perinephric abscess could not be totally excluded. Stable 3.9 cm right adnexal cyst Recommend follow-up US in 6-12 months. Note: This recommendation does not apply to premenarchal patients and to those with increased risk (genetic, family history, elevated tumor markers or other high-risk factors) of ovarian cancer. Reference: JACR 2020 Feb; 17(2):248-254 Electronically Signed   By: Alcide Clever M.D.   On: 01/14/2021 20:44    Anti-infectives: Anti-infectives (From admission, onward)    Start     Dose/Rate Route Frequency Ordered Stop   03/07/21 1000  vancomycin (VANCOCIN) capsule 125 mg  Status:  Discontinued       See Hyperspace for full Linked Orders Report.   125 mg Oral Every 3 DAYS 01/04/2021 1827 12/19/2020 1831   02/07/21 1000  vancomycin (VANCOCIN) capsule 125 mg  Status:  Discontinued       See Hyperspace for full Linked Orders Report.   125 mg Oral Every other day 12/21/2020 1827 12/19/2020 1831   01/31/21 1000  vancomycin (VANCOCIN) capsule 125 mg  Status:  Discontinued       See Hyperspace for full Linked Orders Report.   125 mg Oral Daily 12/28/2020 1827 12/19/2020 1831   01/23/21 2200  vancomycin (VANCOCIN) capsule 125 mg  Status:  Discontinued       See Hyperspace for full Linked Orders Report.   125 mg Oral 2 times daily 12/27/2020 1827 12/28/2020 1831   01/15/21 1230  ceFEPIme (MAXIPIME) 2 g in sodium chloride 0.9 % 100 mL IVPB        2 g 200 mL/hr over 30 Minutes Intravenous Every 24 hours 01/15/21 1136     01/15/21 1200  atovaquone (MEPRON) 750 MG/5ML suspension 750 mg        750 mg Oral 2 times daily with meals 01/15/21 1109 02/05/21 0759   01/10/21 1700  ciprofloxacin (CIPRO) IVPB 400 mg  Status:  Discontinued        400 mg 200 mL/hr over 60 Minutes Intravenous Every 24 hours 12/13/2020 1731 01/06/2021 1827   01/04/2021 2200  metroNIDAZOLE (FLAGYL) IVPB 500 mg  Status:  Discontinued        500 mg 100 mL/hr over 60 Minutes  Intravenous Every 8 hours 12/26/2020 1711 01/08/2021 1827   12/22/2020 2000  metroNIDAZOLE (FLAGYL) IVPB 500 mg        500 mg 100 mL/hr over 60 Minutes Intravenous Every 8 hours 12/19/2020 1831 01/23/21 1959   12/12/2020 1930  metroNIDAZOLE (FLAGYL) IVPB 500 mg  Status:  Discontinued        500 mg 100 mL/hr over 60 Minutes Intravenous Every 8 hours 12/16/2020 1830 01/04/2021 1831   01/01/2021 1930  vancomycin (VANCOCIN) capsule 500 mg        500 mg Oral Every 6 hours 12/14/2020 1831 01/23/21 1759   12/27/2020 1915  vancomycin (VANCOCIN) capsule 125 mg  Status:  Discontinued       See Hyperspace for full Linked Orders Report.   125 mg Oral 4 times daily 12/18/2020 1827 01/01/2021 1831   12/12/2020 1630  piperacillin-tazobactam (ZOSYN) IVPB 3.375 g  Status:  Discontinued        3.375 g 12.5 mL/hr over 240 Minutes Intravenous Every 8 hours 12/21/2020 1619 01/08/2021 1710   12/21/2020 1330  ceFEPIme (MAXIPIME) 2 g in sodium chloride 0.9 % 100 mL IVPB        2 g 200 mL/hr over 30 Minutes Intravenous  Once 01/02/2021 1325 12/14/2020 1602   12/28/2020 1330  metroNIDAZOLE (FLAGYL) IVPB 500 mg  Status:  Discontinued        500 mg 100 mL/hr over 60 Minutes Intravenous  Once 01/06/2021 1325 12/28/2020 1727   01/02/2021 1330  vancomycin (VANCOCIN) IVPB 1000 mg/200 mL premix        1,000 mg 200 mL/hr over 60 Minutes Intravenous  Once 12/29/2020 1325 01/03/2021 1801       Assessment/Plan:  C diff colitis responding to medical rx No surgical intervention TPN to start today HIT, 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  Caroleen Hamman, MD, [facility]  01/15/2021

## 2021-01-15 NOTE — Progress Notes (Signed)
Lauren Bradshaw  MRN: [id number]  DOB/AGE: 07-01-1950 70 y.o.  Primary Care Physician:Ramos, Ria Bush, MD  Admit date: 12/28/2020  Chief Complaint:  Chief Complaint  Patient presents with   Weakness    S-Pt presented on  01/08/2021 with  Chief Complaint  Patient presents with   Weakness  . Patient main concern in today visit was that she feels short of breath  Medications    budesonide (PULMICORT) nebulizer solution  0.5 mg Nebulization BID   Chlorhexidine Gluconate Cloth  6 each Topical Daily   digoxin  0.0625 mg Oral Q48H   heparin injection (subcutaneous)  5,000 Units Subcutaneous Q8H   ipratropium-albuterol  3 mL Nebulization Q4H   levothyroxine  75 mcg Oral Q0600   mouth rinse  15 mL Mouth Rinse BID   methylPREDNISolone (SOLU-MEDROL) injection  40 mg Intravenous Q12H   pantoprazole (PROTONIX) IV  40 mg Intravenous Q24H   vancomycin  500 mg Oral Q6H   zolpidem  5 mg Oral QHS         [id number] from the symptoms mentioned above,there are no other symptoms referable to all systems reviewed.  Physical Exam: Vital signs in last 24 hours: Temp:  [96.4 F (35.8 C)-98.3 F (36.8 C)] 98.2 F (36.8 C) (11/06 0800) Pulse Rate:  [46-122] 47 (11/06 0900) Resp:  [21-43] 30 (11/06 0900) BP: (98-138)/(61-115) 105/71 (11/06 0900) SpO2:  [86 %-95 %] 94 % (11/06 0900) FiO2 (%):  [55 %-68 %] 68 % (11/06 0817) Weight change:  Last BM Date: 01/14/21  Intake/Output from previous day: 11/05 0701 - 11/06 0700 In: 796.9 [P.O.:480; I.V.:16.9; IV Piggyback:300] Out: 190 [Urine:190] Total I/O In: 499.9 [I.V.:144.1; IV Piggyback:355.8] Out: 350 [Urine:350]   Physical Exam:  General- pt is awake,alert, oriented to time place and person  Resp- mild acute REsp distress,  Rhonchi present  CVS- S1S2 regular in rate and rhythm  GIT- BS+, soft, Non tender , Non distended  EXT- No LE Edema,  No Cyanosis    Lab Results:  CBC  Recent Labs    01/14/21 0536 01/15/21 0412   WBC 5.7 3.9*  HGB 9.2* 9.0*  HCT 26.8* 27.5*  PLT 94* 60*    BMET  Recent Labs    01/14/21 0536 01/14/21 1836 01/15/21 0412  NA 131*  --  133*  K 3.6 3.3* 3.1*  3.1*  CL 97*  --  102  CO2 22  --  23  GLUCOSE 78  --  150*  BUN 38*  --  41*  CREATININE 2.17*  --  2.01*  CALCIUM 7.6*  --  7.4*      Most recent Creatinine trend  Lab Results  Component Value Date   CREATININE 2.01 (H) 01/15/2021   CREATININE 2.17 (H) 01/14/2021   CREATININE 1.88 (H) 01/13/2021      MICRO   Recent Results (from the past 240 hour(s))  Resp Panel by RT-PCR (Flu A&B, Covid) Nasopharyngeal Swab     Status: None   Collection Time: 01/05/2021 10:14 AM   Specimen: Nasopharyngeal Swab; Nasopharyngeal(NP) swabs in vial transport medium  Result Value Ref Range Status   SARS Coronavirus 2 by RT PCR NEGATIVE NEGATIVE Final    Comment: (NOTE) SARS-CoV-2 target nucleic acids are NOT DETECTED.  The SARS-CoV-2 RNA is generally detectable in upper respiratory specimens during the acute phase of infection. The lowest concentration of SARS-CoV-2 viral copies this assay can detect is 138 copies/mL. A negative result does not preclude SARS-Cov-2 infection and should  not be used as the sole basis for treatment or other patient management decisions. A negative result may occur with  improper specimen collection/handling, submission of specimen other than nasopharyngeal swab, presence of viral mutation(s) within the areas targeted by this assay, and inadequate number of viral copies(<138 copies/mL). A negative result must be combined with clinical observations, patient history, and epidemiological information. The expected result is Negative.  Fact Sheet for Patients:  EntrepreneurPulse.com.au  Fact Sheet for Healthcare Providers:  IncredibleEmployment.be  This test is no t yet approved or cleared by the [place] FDA and  has been authorized for detection  and/or diagnosis of SARS-CoV-2 by FDA under an Emergency Use Authorization (EUA). This EUA will remain  in effect (meaning this test can be used) for the duration of the COVID-19 declaration under Section 564(b)(1) of the Act, 21 U.S.C.section 360bbb-3(b)(1), unless the authorization is terminated  or revoked sooner.       Influenza A by PCR NEGATIVE NEGATIVE Final   Influenza B by PCR NEGATIVE NEGATIVE Final    Comment: (NOTE) The Xpert Xpress SARS-CoV-2/FLU/RSV plus assay is intended as an aid in the diagnosis of influenza from Nasopharyngeal swab specimens and should not be used as a sole basis for treatment. Nasal washings and aspirates are unacceptable for Xpert Xpress SARS-CoV-2/FLU/RSV testing.  Fact Sheet for Patients: EntrepreneurPulse.com.au  Fact Sheet for Healthcare Providers: IncredibleEmployment.be  This test is not yet approved or cleared by the [place] FDA and has been authorized for detection and/or diagnosis of SARS-CoV-2 by FDA under an Emergency Use Authorization (EUA). This EUA will remain in effect (meaning this test can be used) for the duration of the COVID-19 declaration under Section 564(b)(1) of the Act, 21 U.S.C. section 360bbb-3(b)(1), unless the authorization is terminated or revoked.  Performed at [facility], [place], [place] [address]   C Difficile Quick Screen w PCR reflex     Status: Abnormal   Collection Time: 12/19/2020 10:14 AM   Specimen: STOOL  Result Value Ref Range Status   C Diff antigen POSITIVE (A) NEGATIVE Final   C Diff toxin POSITIVE (A) NEGATIVE Final   C Diff interpretation Toxin producing C. difficile detected.  Final    Comment: POSITIVE CRITICAL RESULT CALLED TO, READ BACK BY AND VERIFIED WITH: OLIVIA PRILLIEM 12/24/2020 1715 MU Performed at [facility], [address] [place], [place] [address]   Gastrointestinal Panel by PCR , Stool      Status: None   Collection Time: 12/14/2020 10:14 AM   Specimen: STOOL  Result Value Ref Range Status   Campylobacter species NOT DETECTED NOT DETECTED Final   Plesimonas shigelloides NOT DETECTED NOT DETECTED Final   Salmonella species NOT DETECTED NOT DETECTED Final   Yersinia enterocolitica NOT DETECTED NOT DETECTED Final   Vibrio species NOT DETECTED NOT DETECTED Final   Vibrio cholerae NOT DETECTED NOT DETECTED Final   Enteroaggregative E coli (EAEC) NOT DETECTED NOT DETECTED Final   Enteropathogenic E coli (EPEC) NOT DETECTED NOT DETECTED Final   Enterotoxigenic E coli (ETEC) NOT DETECTED NOT DETECTED Final   Shiga like toxin producing E coli (STEC) NOT DETECTED NOT DETECTED Final   Shigella/Enteroinvasive E coli (EIEC) NOT DETECTED NOT DETECTED Final   Cryptosporidium NOT DETECTED NOT DETECTED Final   Cyclospora cayetanensis NOT DETECTED NOT DETECTED Final   Entamoeba histolytica NOT DETECTED NOT DETECTED Final   Giardia lamblia NOT DETECTED NOT DETECTED Final   Adenovirus F40/41 NOT DETECTED NOT DETECTED  Final   Astrovirus NOT DETECTED NOT DETECTED Final   Norovirus GI/GII NOT DETECTED NOT DETECTED Final   Rotavirus A NOT DETECTED NOT DETECTED Final   Sapovirus (I, II, IV, and V) NOT DETECTED NOT DETECTED Final    Comment: Performed at [facility], [address] [place], [place] [address]  Blood Culture (routine x 2)     Status: None   Collection Time: 12/19/2020  1:49 PM   Specimen: BLOOD  Result Value Ref Range Status   Specimen Description BLOOD LEFT ANTECUBITAL  Final   Special Requests   Final    BOTTLES DRAWN AEROBIC ONLY Blood Culture results may not be optimal due to an inadequate volume of blood received in culture bottles   Culture   Final    NO GROWTH 5 DAYS Performed at [facility], [address], [place], [place] [address]    Report Status 01/14/2021 FINAL  Final  Blood Culture (routine x 2)     Status: None   Collection Time: 01/03/2021   2:16 PM   Specimen: BLOOD  Result Value Ref Range Status   Specimen Description BLOOD BLOOD RIGHT ARM  Final   Special Requests   Final    BOTTLES DRAWN AEROBIC AND ANAEROBIC Blood Culture adequate volume   Culture   Final    NO GROWTH 5 DAYS Performed at [facility], [address], [place], [place] [address]    Report Status 01/14/2021 FINAL  Final  MRSA Next Gen by PCR, Nasal     Status: None   Collection Time: 01/03/2021  6:05 PM   Specimen: Nasal Mucosa; Nasal Swab  Result Value Ref Range Status   MRSA by PCR Next Gen NOT DETECTED NOT DETECTED Final    Comment: (NOTE) The GeneXpert MRSA Assay (FDA approved for NASAL specimens only), is one component of a comprehensive MRSA colonization surveillance program. It is not intended to diagnose MRSA infection nor to guide or monitor treatment for MRSA infections. Test performance is not FDA approved in patients less than 72 years old. Performed at [facility], [address], [place], [place] [address]   Urine Culture     Status: None   Collection Time: 01/10/21  8:07 AM   Specimen: In/Out Cath Urine  Result Value Ref Range Status   Specimen Description   Final    IN/OUT CATH URINE Performed at [facility], [address], [place], [place] [address]    Special Requests   Final    NONE Performed at [facility], [address], [place], [place] [address]    Culture   Final    NO GROWTH Performed at [facility], [address], [place], [place] [address]    Report Status 01/11/2021 FINAL  Final  Body fluid culture w Gram Stain     Status: None (Preliminary result)   Collection Time: 01/13/21  2:45 PM   Specimen: PATH Cytology Pleural fluid  Result Value Ref Range Status   Specimen Description   Final    PLEURAL Performed at [facility], [address] [place], [place] [address]    Special Requests   Final    PLEURAL Performed at [facility], [address] [place], [place] [address]    Gram Stain   Final    NO SQUAMOUS EPITHELIAL CELLS SEEN NO WBC SEEN NO ORGANISMS SEEN    Culture   Final    NO GROWTH 2  DAYS Performed at [facility], [place] [address], [place], [place] [address]    Report Status PENDING  Incomplete     CT abdomen  done on  10.31.22 showed 2. 8.7 cm low-density left renal subcapsular fluid collection, concerning for abscess. Correlate with urinalysis. Addendum  The patient recently underwent a random left renal biopsy a few weeks ago that a required  embolization. As such, the left renal subcapsular fluid collection  likely represents old hematoma and the previously described 4 mm  left renal calculus is likely a vascular coil.   Impression:   Lauren Bradshaw is a 70 y.o. female with medical problems of  C Diff, A Fib, HTN, HDL, vasculitis , pleural effusions was admitted on 12/14/2020 for :   Dehydration [E86.0] Hypovolemic shock ([place]) [R57.1] Hyponatremia [E87.1] Weakness [R53.1] Atrial fibrillation with rapid ventricular response (HCC) [I48.91] Hypotension, unspecified hypotension type [I95.9] Diarrhea, unspecified type [R19.7] Sepsis, due to unspecified organism, unspecified whether acute organ dysfunction present ([place]) [A41.9]  Impression  1)Renal    AKI secondary to  ATN caused by hemodynamic instability  Patient recently had A. fib with RVR Patient is admitted with septic shock  AKI on CKD  CKD stage 3b. 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  Pt was recently diagnosed with Vasculitis.  Pt received  Patient has already received rituximab couple months ago as per information available. There is possible contribution from renal infarction as well as patient did undergo coil embolization of the kidney on the left side after her renal biopsy   I did educate  the patient and the family about multiple kidney related injuries in recent history Patient was diagnosed with ANCA associated vasculitis requiring rituximab -Patient had renal hematoma after she had renal biopsy and required coil embolization -And now patient is admitted with hypotension, sepsis, A. fib with RVR and is having ATN.     Patient's AKI is stable  Patient is nonoliguric No need for renal replacement therapy yet  2)Hypotension   Blood pressure is stable    3)Anemia of chronic disease  CBC Latest Ref Rng & Units 01/15/2021 01/14/2021 01/13/2021  WBC 4.0 - 10.5 K/uL 3.9(L) 5.7 7.8  Hemoglobin 12.0 - 15.0 g/dL 9.0(L) 9.2(L) 9.9(L)  Hematocrit 36.0 - 46.0 % 27.5(L) 26.8(L) 29.1(L)  Platelets 150 - 400 K/uL 60(L) 94(L) 121(L)    Patient has thrombocytopenia secondary to multiple factors Patient admitted with sepsis    HGb at goal (9--11)   4) A fib w RVR Now better    5) sepsis Patient had C. difficile pancolitis  at the time of admission. Patient also had patient had hypovolemic shock Patient admitted with hypotension, lactic acidosis, colitis due to C. Difficile   Patient currently is developing bilateral pneumonia Patient is on broad-spectrum antibiotics and stress dose steroids  Surgery team and pulmonary/intensivist is following    6) Electrolytes   BMP Latest Ref Rng & Units 01/15/2021 01/15/2021 01/14/2021  Glucose 70 - 99 mg/dL 150(H) - -  BUN 8 - 23 mg/dL 41(H) - -  Creatinine 0.44 - 1.00 mg/dL 2.01(H) - -  Sodium 135 - 145 mmol/L 133(L) - -  Potassium 3.5 - 5.1 mmol/L 3.1(L) 3.1(L) 3.3(L)  Chloride 98 - 111 mmol/L 102 - -  CO2 22 - 32 mmol/L 23 - -  Calcium 8.9 - 10.3 mg/dL 7.4(L) - -  Sodium Hyponatremia Patient most likely has hypovolemic hyponatremia Patient sodium is stable   Potassium Hypokalemia Patient potassium is being replete     7)Acid base  Co2 at goal   8) A. fib with RVR Cardiology is  following  Plan:   Will continue current care    Atwell Mcdanel s [facility] 01/15/2021, 9:41 AM

## 2021-01-15 NOTE — Progress Notes (Signed)
PHARMACY CONSULT NOTE  Pharmacy Consult for Electrolyte Monitoring and Replacement   Recent Labs: Potassium (mmol/L)  Date Value  01/15/2021 3.1 (L)  01/15/2021 3.1 (L)   Magnesium (mg/dL)  Date Value  64/15/8309 2.2   Calcium (mg/dL)  Date Value  40/76/8088 7.4 (L)   Albumin (g/dL)  Date Value  01/12/1593 2.1 (L)   Phosphorus (mg/dL)  Date Value  58/59/2924 3.1   Sodium (mmol/L)  Date Value  01/15/2021 133 (L)   Assessment: 70 y.o. female who presents emergency department for generalized fatigue/weakness. Pharmacy has been consulted for electrolyte replacement.   MIVF: D5LR at 30 mL/hr  Goal of Therapy:  K 4 - 5.1 mmol/L Mg 2 - 2.4 mg/dL All other electrolytes within normal limits  Plan:  Hyponatremia improving K 3.1, Kcl 40 mEq PO x 1 + IV Kcl 10 mEq x 2 Scr slightly improved Will continue to follow along  [name] 01/15/2021 7:34 AM

## 2021-01-15 NOTE — Progress Notes (Signed)
SUBJECTIVE: Patient had rough night last night with some shortness of breath   Vitals:   01/15/21 0800 01/15/21 0817 01/15/21 0900 01/15/21 1000  BP: 125/78  105/71 (!) 99/58  Pulse: (!) 56  (!) 47 70  Resp: (!) 43  (!) 30 (!) 36  Temp: 98.2 F (36.8 C)     TempSrc:      SpO2: 94% 94% 94% 92%  Weight:      Height:        Intake/Output Summary (Last 24 hours) at 01/15/2021 1121 Last data filed at 01/15/2021 0900 Gross per 24 hour  Intake 1176.8 ml  Output 495 ml  Net 681.8 ml    LABS: Basic Metabolic Panel: Recent Labs    01/14/21 0536 01/14/21 1836 01/15/21 0412  NA 131*  --  133*  K 3.6 3.3* 3.1*  3.1*  CL 97*  --  102  CO2 22  --  23  GLUCOSE 78  --  150*  BUN 38*  --  41*  CREATININE 2.17*  --  2.01*  CALCIUM 7.6*  --  7.4*  MG 2.5*  --  2.2  PHOS 4.3  --  3.1   Liver Function Tests: Recent Labs    01/14/21 0536 01/15/21 0412  AST 10* 17  ALT 7 10  ALKPHOS 459* 455*  BILITOT 0.9 1.2  PROT 4.1* 4.0*  ALBUMIN 1.6* 2.1*   No results for input(s): LIPASE, AMYLASE in the last 72 hours. CBC: Recent Labs    01/14/21 0536 01/15/21 0412  WBC 5.7 3.9*  NEUTROABS 4.2 2.8  HGB 9.2* 9.0*  HCT 26.8* 27.5*  MCV 82.2 84.4  PLT 94* 60*   Cardiac Enzymes: No results for input(s): CKTOTAL, CKMB, CKMBINDEX, TROPONINI in the last 72 hours. BNP: Invalid input(s): POCBNP D-Dimer: No results for input(s): DDIMER in the last 72 hours. Hemoglobin A1C: No results for input(s): HGBA1C in the last 72 hours. Fasting Lipid Panel: No results for input(s): CHOL, HDL, LDLCALC, TRIG, CHOLHDL, LDLDIRECT in the last 72 hours. Thyroid Function Tests: No results for input(s): TSH, T4TOTAL, T3FREE, THYROIDAB in the last 72 hours.  Invalid input(s): FREET3 Anemia Panel: No results for input(s): VITAMINB12, FOLATE, FERRITIN, TIBC, IRON, RETICCTPCT in the last 72 hours.   PHYSICAL EXAM General: Well developed, well nourished, in no acute distress HEENT:  Normocephalic  and atramatic Neck:  No JVD.  Lungs: Clear bilaterally to auscultation and percussion. Heart: HRRR . Normal S1 and S2 without gallops or murmurs.  Abdomen: Bowel sounds are positive, abdomen soft and non-tender  Msk:  Back normal, normal gait. Normal strength and tone for age. Extremities: No clubbing, cyanosis or edema.   Neuro: Alert and oriented X 3. Psych:  Good affect, responds appropriately  TELEMETRY: Atrial tachycardia with heart rate between 90 and 100  ASSESSMENT AND PLAN: Atrial fibrillation with rapid ventricular response rate and hypotension and respiratory distress.  Digoxin was started yesterday and continues to keep the heart rate at reasonable level between 90 and 110.  Active Problems:   Hypovolemic shock (HCC)   Colitis due to Clostridium difficile   Diarrhea    Adrian Blackwater A, MD, [facility] 01/15/2021 11:21 AM

## 2021-01-16 DIAGNOSIS — D61818 Other pancytopenia: Secondary | ICD-10-CM

## 2021-01-16 DIAGNOSIS — D696 Thrombocytopenia, unspecified: Secondary | ICD-10-CM | POA: Diagnosis not present

## 2021-01-16 DIAGNOSIS — R52 Pain, unspecified: Secondary | ICD-10-CM | POA: Diagnosis not present

## 2021-01-16 DIAGNOSIS — R0602 Shortness of breath: Secondary | ICD-10-CM

## 2021-01-16 DIAGNOSIS — R0902 Hypoxemia: Secondary | ICD-10-CM

## 2021-01-16 DIAGNOSIS — Z66 Do not resuscitate: Secondary | ICD-10-CM

## 2021-01-16 DIAGNOSIS — D649 Anemia, unspecified: Secondary | ICD-10-CM | POA: Diagnosis not present

## 2021-01-16 DIAGNOSIS — A0472 Enterocolitis due to Clostridium difficile, not specified as recurrent: Secondary | ICD-10-CM | POA: Diagnosis not present

## 2021-01-16 DIAGNOSIS — I4891 Unspecified atrial fibrillation: Secondary | ICD-10-CM | POA: Diagnosis not present

## 2021-01-16 DIAGNOSIS — R571 Hypovolemic shock: Secondary | ICD-10-CM | POA: Diagnosis not present

## 2021-01-16 DIAGNOSIS — K529 Noninfective gastroenteritis and colitis, unspecified: Secondary | ICD-10-CM | POA: Diagnosis not present

## 2021-01-16 DIAGNOSIS — Z515 Encounter for palliative care: Secondary | ICD-10-CM

## 2021-01-16 LAB — HEMOGLOBIN A1C
Hgb A1c MFr Bld: 5.4 % (ref 4.8–5.6)
Mean Plasma Glucose: 108.28 mg/dL

## 2021-01-16 LAB — CBC WITH DIFFERENTIAL/PLATELET
Abs Immature Granulocytes: 0.34 10*3/uL — ABNORMAL HIGH (ref 0.00–0.07)
Basophils Absolute: 0 10*3/uL (ref 0.0–0.1)
Basophils Relative: 0 %
Eosinophils Absolute: 0 10*3/uL (ref 0.0–0.5)
Eosinophils Relative: 0 %
HCT: 21.8 % — ABNORMAL LOW (ref 36.0–46.0)
Hemoglobin: 7.1 g/dL — ABNORMAL LOW (ref 12.0–15.0)
Immature Granulocytes: 13 %
Lymphocytes Relative: 9 %
Lymphs Abs: 0.2 10*3/uL — ABNORMAL LOW (ref 0.7–4.0)
MCH: 27.7 pg (ref 26.0–34.0)
MCHC: 32.6 g/dL (ref 30.0–36.0)
MCV: 85.2 fL (ref 80.0–100.0)
Monocytes Absolute: 0.1 10*3/uL (ref 0.1–1.0)
Monocytes Relative: 4 %
Neutro Abs: 1.9 10*3/uL (ref 1.7–7.7)
Neutrophils Relative %: 74 %
Platelets: 42 10*3/uL — ABNORMAL LOW (ref 150–400)
RBC: 2.56 MIL/uL — ABNORMAL LOW (ref 3.87–5.11)
RDW: 22.5 % — ABNORMAL HIGH (ref 11.5–15.5)
Smear Review: DECREASED
WBC: 2.6 10*3/uL — ABNORMAL LOW (ref 4.0–10.5)
nRBC: 0 % (ref 0.0–0.2)

## 2021-01-16 LAB — COMPREHENSIVE METABOLIC PANEL WITH GFR
ALT: 10 U/L (ref 0–44)
AST: 10 U/L — ABNORMAL LOW (ref 15–41)
Albumin: 2.6 g/dL — ABNORMAL LOW (ref 3.5–5.0)
Alkaline Phosphatase: 353 U/L — ABNORMAL HIGH (ref 38–126)
Anion gap: 8 (ref 5–15)
BUN: 42 mg/dL — ABNORMAL HIGH (ref 8–23)
CO2: 24 mmol/L (ref 22–32)
Calcium: 8 mg/dL — ABNORMAL LOW (ref 8.9–10.3)
Chloride: 103 mmol/L (ref 98–111)
Creatinine, Ser: 1.69 mg/dL — ABNORMAL HIGH (ref 0.44–1.00)
GFR, Estimated: 33 mL/min — ABNORMAL LOW
Glucose, Bld: 164 mg/dL — ABNORMAL HIGH (ref 70–99)
Potassium: 4 mmol/L (ref 3.5–5.1)
Sodium: 135 mmol/L (ref 135–145)
Total Bilirubin: 1.1 mg/dL (ref 0.3–1.2)
Total Protein: 4.3 g/dL — ABNORMAL LOW (ref 6.5–8.1)

## 2021-01-16 LAB — TRIGLYCERIDES: Triglycerides: 181 mg/dL — ABNORMAL HIGH (ref <150)

## 2021-01-16 LAB — MAGNESIUM: Magnesium: 2.2 mg/dL (ref 1.7–2.4)

## 2021-01-16 LAB — GLUCOSE, CAPILLARY
Glucose-Capillary: 74 mg/dL (ref 70–99)
Glucose-Capillary: 175 mg/dL — ABNORMAL HIGH (ref 70–99)
Glucose-Capillary: 155 mg/dL — ABNORMAL HIGH (ref 70–99)

## 2021-01-16 LAB — PHOSPHORUS: Phosphorus: 3.5 mg/dL (ref 2.5–4.6)

## 2021-01-16 LAB — DIGOXIN LEVEL: Digoxin Level: 3 ng/mL (ref 0.8–2.0)

## 2021-01-16 MED ORDER — LORAZEPAM 2 MG/ML IJ SOLN
0.5000 mg | INTRAMUSCULAR | Status: DC | PRN
  Administered 2021-01-16 – 2021-01-17 (×3): 0.5 mg via INTRAVENOUS
  Filled 2021-01-16 (×3): qty 1

## 2021-01-16 MED ORDER — HALOPERIDOL LACTATE 5 MG/ML IJ SOLN
0.5000 mg | INTRAMUSCULAR | Status: DC | PRN
  Administered 2021-01-16 (×2): 0.5 mg via INTRAVENOUS
  Filled 2021-01-16 (×2): qty 1

## 2021-01-16 MED ORDER — HALOPERIDOL LACTATE 2 MG/ML PO CONC
0.5000 mg | ORAL | Status: DC | PRN
  Filled 2021-01-16: qty 0.3

## 2021-01-16 MED ORDER — GLYCOPYRROLATE 0.2 MG/ML IJ SOLN: 0.2000 mg | INTRAMUSCULAR | Status: DC | PRN

## 2021-01-16 MED ORDER — IMMUNE GLOBULIN (HUMAN) 10 GM/100ML IV SOLN
1.0000 g/kg | INTRAVENOUS | Status: DC
  Filled 2021-01-16: qty 600

## 2021-01-16 MED ORDER — ACETAMINOPHEN 650 MG RE SUPP: 650.0000 mg | Freq: Four times a day (QID) | RECTAL | Status: DC | PRN

## 2021-01-16 MED ORDER — HALOPERIDOL 0.5 MG PO TABS
0.5000 mg | ORAL_TABLET | ORAL | Status: DC | PRN
  Filled 2021-01-16: qty 1

## 2021-01-16 MED ORDER — ACETAMINOPHEN 325 MG PO TABS: 650.0000 mg | ORAL_TABLET | Freq: Four times a day (QID) | ORAL | Status: DC | PRN

## 2021-01-16 MED ORDER — HYDROMORPHONE HCL 1 MG/ML IJ SOLN
0.5000 mg | Freq: Once | INTRAMUSCULAR | Status: AC
  Administered 2021-01-16: 0.5 mg via INTRAVENOUS
  Filled 2021-01-16: qty 1

## 2021-01-16 MED ORDER — POLYVINYL ALCOHOL 1.4 % OP SOLN
1.0000 [drp] | Freq: Four times a day (QID) | OPHTHALMIC | Status: DC | PRN
  Filled 2021-01-16: qty 15

## 2021-01-16 MED ORDER — GLYCOPYRROLATE 1 MG PO TABS
1.0000 mg | ORAL_TABLET | ORAL | Status: DC | PRN
  Filled 2021-01-16: qty 1

## 2021-01-16 MED ORDER — HYDROMORPHONE HCL 1 MG/ML IJ SOLN
0.5000 mg | INTRAMUSCULAR | Status: DC | PRN
  Administered 2021-01-16 – 2021-01-17 (×3): 0.5 mg via INTRAVENOUS
  Filled 2021-01-16 (×3): qty 1

## 2021-01-16 NOTE — Consult Note (Addendum)
Consultation Note Date: 01/16/2021   Patient Name: Lauren Bradshaw  DOB: 1950/04/03  MRN: [id number]  Age / Sex: 70 y.o., female  PCP: Kateri Mc, MD Referring Physician: Tyler Pita, MD  Reason for Consultation: Establishing goals of care  HPI/Patient Profile: 70 y.o. female  with past medical history of pneumonia with respiratory failure A. 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 difficile admitted on 12/20/2020 with with weakness, lethargy, diarrhea, and abdominal pain.  While hospitalized, patient noted to have hemodynamic instability, hypovolemic septic shock, multiorgan failure with respiratory distress, renal failure, cardiac failure with A. fib, severe leukopenia, severe metabolic acidosis, encephalopathy, and bilateral infiltrates with suspected pulmonary edema related to poor kidney function.  Dr. Mortimer Fries initiated a goals of care discussion with family on 11/6.  Palliative medicine team was consulted to discuss goals of care.  Clinical Assessment and Goals of Care: I have reviewed medical records including EPIC notes, labs and imaging, received report from Dr. Patsey Berthold, assessed the patient and then met with the patient, her husband, her son, and her daughter-in-law at bedside to discuss diagnosis prognosis, [place], EOL wishes, disposition and options.  I introduced Palliative Medicine as specialized medical care for people living with serious illness. It focuses on providing relief from the symptoms and stress of a serious illness. The goal is to improve quality of life for both the patient and the family.  We discussed a brief life review of the patient.  She has been married for over 30 years.  She has 1 son, Lauren Bradshaw, who is a Theme park manager.  Her daughter-in-law Lauren Bradshaw is a Education officer, museum, who provided a detailed and thorough explanation  of the patient's course and hospitalizations over the past 6+ months.  As far as functional and nutritional status prior to the admission the patient had been living with her son and daughter-in-law after extensive hospitalizations and and admission to a skilled nursing facility for rehab.  About 2 weeks ago while living with her son and daughter-in-law she experienced excessive diarrhea from C. difficile and became unable to get out of bed or do ADLs independently.  Her respiratory status became so painful and compromised they decided to bring her to the hospital.  We discussed patient's current illness and what it means in the larger context of patient's on-going co-morbidities.  Natural disease trajectory and expectations at EOL were discussed. I attempted to elicit values and goals of care important to the patient. The difference between aggressive medical intervention and comfort care was considered in light of the patient's goals of care. Advance directives, concepts specific to code status, artificial feeding and hydration, and rehospitalization were considered and discussed.  Patient verbalize she does not want to be on a ventilator.  She also verbalizes that she is tired.  She says she is not giving up but she just cannot keep going like this.  She does not want to be in pain.  She is asking what hospice  facility she could be moved to.  Her husband at bedside appeared tearful.  He began telling her that she could not give up and that she had to keep going.  I allowed space for therapeutic silence and emotions to be shared.  I reviewed that no one wants to die however we are all mortal.  I shared that her body has fought the good fight and that it just cannot keep up with its demands any longer.  Patient was clear that she wants to move towards a full comfort pathway and avoid any other medical or aggressive treatment.    Patient verbalized that she does not want to remain a full code but would  rather allow a natural death.  I shared that allowing a natural death would mean that we do not attempt CPR and we never intubate.  She was in agreement and said she would like to be a DNR.  She also verbalized that wants to be kept comfortable and out of pain.  She does not want to eat or drink anything and just wants to be out of pain.  I reviewed the options for comfort path included going home with hospice or going to a hospice inpatient facility.  Patient shared she does not want to die at home.  I reviewed that the patient is on a high level of oxygen.  I shared I would review the patient with the hospice inpatient liaison and see if she is a candidate to be transferred to hospice.  Patient prefers to go to hospice inpatient in [facility] or [place] if available.  If no bed is an option at a hospice inpatient facility, patient shared she is fine with remaining and passing away in the hospital.  I educated both the patient and the family that from this point forward we would focus strictly on keeping her comfortable.  I reviewed this would involve no longer doing blood draws, taking blood pressures or blood sugars, and remove any medications or treatments that would not make her more comfortable.  I shared we would focus on compassionate and dignified care that would keep her clean dry and free of pain, anxiety, and shortness of breath.  I summarized our visit.  I shared that the patient's CODE STATUS will be changed from full code to DNR, we would focus strictly on comfort measures, and we would look for placement at a hospice inpatient unit.  Showed a hospice inpatient unit not be able to accept her at the level of oxygen she requires, patient would remain in the hospital for an anticipated hospital death.  Patient and family were all in agreement. Patient said thank you and that all she wants is to be kept out of pain.  I assured her we would use all medical means possible to keep her as  comfortable as possible.  Dr. Patsey Berthold , bedside nurse Lauren Bradshaw, and SW Maurice Small made aware that patient wishes to move towards a full comfort pathway..  Discussed with patient/family the importance of continued conversation with family and the medical providers regarding overall plan of care and treatment options, ensuring decisions are within the context of the patient's values and GOCs.    Hospice and Palliative Care services outpatient were explained and offered.  Questions and concerns were addressed. The family was encouraged to call with questions or concerns.   Update:  As per hospice liaison patient cannot be accepted at inpatient hospice facility in [facility] unless she is on 25  L of oxygen or less.  Patient also cannot be transported unless at 15 L or less.    I shared this information patient and her husband at bedside.  I educated her that this means she will need to remain in the hospital at this point in time.  I advised her that we will begin to titrate her off of the oxygen and see how she responds while also making sure she is kept comfortable, out of pain, and not having shortness of breath.  I advised her that we would not start this process until at the earliest tomorrow.  She has made large decisions regarding her goals of care and end-of-life wishes.  I advised her to continue the important and meaningful conversations with her family regarding EOL and consider titration tomorrow.  She was in agreement with this plan.  Husband at bedside was in agreement as well.  Primary Decision Maker PATIENT  Code Status/Advance Care Planning: DNR  Prognosis:   < 2 weeks  Discharge Planning: Hospice facility  Primary Diagnoses: Present on Admission:  Hypovolemic shock ([place])   Physical Exam Vitals and nursing note reviewed.  Constitutional:      Appearance: She is ill-appearing.  HENT:     Head: Normocephalic and atraumatic.     Mouth/Throat:     Mouth: Mucous membranes  are dry.  Cardiovascular:     Rate and Rhythm: Normal rate.     Pulses: Normal pulses.  Pulmonary:     Comments: Shallow breaths, SOB with speaking more than a few words Musculoskeletal:     Comments: Generalized weakness  Skin:    General: Skin is warm.     Coloration: Skin is pale.  Neurological:     Mental Status: She is alert and oriented to person, place, and time. Mental status is at baseline.  Psychiatric:        Mood and Affect: Mood normal.        Behavior: Behavior normal.        Thought Content: Thought content normal.        Judgment: Judgment normal.    Vital Signs: BP 117/66   Pulse (!) 52   Temp 98.6 F (37 C)   Resp (!) 31   Ht '5\' 4"'  (1.626 m)   Wt 57.8 kg   SpO2 92%   BMI 21.87 kg/m  Pain Scale: 0-10 POSS *See Group Information*: 1-Acceptable,Awake and alert Pain Score: 0-No pain SpO2: SpO2: 92 % O2 Device:SpO2: 92 % O2 Flow Rate: .O2 Flow Rate (L/min): 40 L/min  Palliative Assessment/Data: 20%     I discussed this patient's plan of care with Dr. Patsey Berthold, RN Lauren Bradshaw, SW Maurice Small, patient, patient's husband, son, and daughter in law.  Thank you for this consult. Palliative medicine will continue to follow and assist holistically.   Time Total: 70 minutes Greater than 50%  of this time was spent counseling and coordinating care related to the above assessment and plan.  Signed by: Jordan Hawks, DNP, FNP-BC Palliative Medicine    Please contact Palliative Medicine Team phone at [phone] for questions and concerns.  For individual provider: See Shea Evans

## 2021-01-16 NOTE — Progress Notes (Signed)
[facility]  [phone] [phone]) [phone]  ID: Lauren Bradshaw OB: March 07, 1951  MR#: [id number]  DGL#:[id number]  Patient Care Team: Kateri Mc, MD as PCP - General (Family Medicine)  CHIEF COMPLAINT: Pancytopenia.  INTERVAL HISTORY: Patient remains critically ill and on high flow nasal cannula.  Her white blood cell count initially resolved, but now has trended down to 2.6 with a worsening anemia and thrombocytopenia.  HITT panel has been sent.  Family at bedside.  REVIEW OF SYSTEMS:   Review of Systems  Unable to perform ROS: Acuity of condition    PAST MEDICAL HISTORY: Past Medical History:  Diagnosis Date   Atrial fibrillation (HCC)    C. difficile diarrhea    GERD (gastroesophageal reflux disease)    Hyperlipidemia    Hypertension    Insomnia    Seizure ([place])    Thyroid disease    Vasculitis ([place])     PAST SURGICAL HISTORY: History reviewed. No pertinent surgical history.  FAMILY HISTORY: No family history on file.  ADVANCED DIRECTIVES (Y/N):  '@ADVDIR' @  HEALTH MAINTENANCE: Social History   Tobacco Use   Smoking status: Never   Smokeless tobacco: Never  Substance Use Topics   Alcohol use: Not Currently     Colonoscopy:  PAP:  Bone density:  Lipid panel:  Allergies  Allergen Reactions   Codeine Shortness Of Breath   Heparin Other (See Comments)    HIT work-up in process   Sulfa Antibiotics Shortness Of Breath    Current Facility-Administered Medications  Medication Dose Route Frequency Provider Last Rate Last Admin   (feeding supplement) PROSource Plus liquid 30 mL  30 mL Oral BID BM Kasa, Kurian, MD   30 mL at 01/15/21 1237   0.9 %  sodium chloride infusion  250 mL Intravenous Continuous Milus Banister, NP   Held at 12/17/2020 1620   0.9 %  sodium chloride infusion  250 mL Intravenous PRN Flora Lipps, MD       atovaquone (MEPRON) 750 MG/5ML suspension 750 mg  750 mg Oral BID WC Flora Lipps, MD   750 mg at 01/15/21  1704   budesonide (PULMICORT) nebulizer solution 0.5 mg  0.5 mg Nebulization BID Flora Lipps, MD   0.5 mg at 01/16/21 0720   Chlorhexidine Gluconate Cloth 2 % PADS 6 each  6 each Topical Daily Flora Lipps, MD   6 each at 01/15/21 2138   dexmedetomidine (PRECEDEX) 400 MCG/100ML (4 mcg/mL) infusion  0.4-1.2 mcg/kg/hr Intravenous Titrated Flora Lipps, MD 2.74 mL/hr at 01/16/21 0625 0.2 mcg/kg/hr at 01/16/21 5188   feeding supplement (BOOST / RESOURCE BREEZE) liquid 1 Container  1 Container Oral BID BM Flora Lipps, MD       fentaNYL (SUBLIMAZE) injection 25 mcg  25 mcg Intravenous Q2H PRN Lang Snow, NP   25 mcg at 01/15/21 0232   Immune Globulin 10% (PRIVIGEN) IV infusion 60 g  1 g/kg Intravenous Q24 Hr x 2 Tyler Pita, MD       insulin aspart (novoLOG) injection 0-6 Units  0-6 Units Subcutaneous Q8H Lockie Mola B, RPH   1 Units at 01/16/21 0430   ipratropium-albuterol (DUONEB) 0.5-2.5 (3) MG/3ML nebulizer solution 3 mL  3 mL Nebulization Q4H Flora Lipps, MD   3 mL at 01/16/21 0720   lactated ringers infusion   Intravenous Continuous Benita Gutter, RPH 30 mL/hr at 01/15/21 1737 New Bag at 01/15/21 1737   levalbuterol (XOPENEX) nebulizer solution 0.63 mg  0.63 mg Nebulization  Q6H PRN Rust-Chester, Huel Cote, NP       levothyroxine (SYNTHROID) tablet 75 mcg  75 mcg Oral Q0600 Lorna Dibble, RPH   75 mcg at 01/15/21 0641   MEDLINE mouth rinse  15 mL Mouth Rinse BID Flora Lipps, MD   15 mL at 01/15/21 1000   melatonin tablet 5 mg  5 mg Oral QHS PRN Lang Snow, NP   5 mg at 01/12/21 2057   methylPREDNISolone sodium succinate (SOLU-MEDROL) 40 mg/mL injection 40 mg  40 mg Intravenous Q12H Flora Lipps, MD   40 mg at 01/15/21 2037   metroNIDAZOLE (FLAGYL) IVPB 500 mg  500 mg Intravenous Q8H Graves, Dana E, NP 100 mL/hr at 01/16/21 0334 500 mg at 01/16/21 0334   ondansetron (ZOFRAN) injection 4 mg  4 mg Intravenous Q6H PRN Milus Banister, NP   4 mg at 01/15/21 0820    pantoprazole (PROTONIX) injection 40 mg  40 mg Intravenous Q24H Lorna Dibble, RPH   40 mg at 01/15/21 1100   sodium chloride flush (NS) 0.9 % injection 3 mL  3 mL Intravenous Q12H Flora Lipps, MD   3 mL at 01/15/21 2140   sodium chloride flush (NS) 0.9 % injection 3 mL  3 mL Intravenous PRN Flora Lipps, MD       TPN ADULT (ION)   Intravenous Continuous TPN Benita Gutter, RPH 33 mL/hr at 01/15/21 1734 New Bag at 01/15/21 1734   vancomycin (VANCOCIN) capsule 500 mg  500 mg Oral Q6H Graves, Dana E, NP   500 mg at 01/15/21 2340   zolpidem (AMBIEN) tablet 5 mg  5 mg Oral QHS Flora Lipps, MD   5 mg at 01/14/21 2106    OBJECTIVE: Vitals:   01/16/21 0800 01/16/21 0900  BP: 113/65 117/66  Pulse: (!) 107 (!) 52  Resp: (!) 26 (!) 31  Temp: 98.6 F (37 C)   SpO2: 90% 92%     Body mass index is 21.87 kg/m.    ECOG FS:4 - Bedbound  General: Ill-appearing, no acute distress. Eyes: Pink conjunctiva, anicteric sclera. HEENT: Normocephalic, moist mucous membranes. Lungs: No audible wheezing or coughing. Heart: Regular rate and rhythm. Abdomen: Soft, nontender, no obvious distention. Musculoskeletal: No edema, cyanosis, or clubbing. Neuro: Lethargic. Cranial nerves grossly intact. Skin: No rashes or petechiae noted.  LAB RESULTS:  Lab Results  Component Value Date   NA 135 01/16/2021   K 4.0 01/16/2021   CL 103 01/16/2021   CO2 24 01/16/2021   GLUCOSE 164 (H) 01/16/2021   BUN 42 (H) 01/16/2021   CREATININE 1.69 (H) 01/16/2021   CALCIUM 8.0 (L) 01/16/2021   PROT 4.3 (L) 01/16/2021   ALBUMIN 2.6 (L) 01/16/2021   AST 10 (L) 01/16/2021   ALT 10 01/16/2021   ALKPHOS 353 (H) 01/16/2021   BILITOT 1.1 01/16/2021   GFRNONAA 33 (L) 01/16/2021    Lab Results  Component Value Date   WBC 2.6 (L) 01/16/2021   NEUTROABS 1.9 01/16/2021   HGB 7.1 (L) 01/16/2021   HCT 21.8 (L) 01/16/2021   MCV 85.2 01/16/2021   PLT 42 (L) 01/16/2021     STUDIES: CT ABDOMEN PELVIS WO  CONTRAST  Addendum Date: 12/18/2020   ADDENDUM REPORT: 01/06/2021 16:17 ADDENDUM: Additional clinical history has been provided. The patient recently underwent a random left renal biopsy a few weeks ago that a required embolization. As such, the left renal subcapsular fluid collection likely represents old hematoma and the previously described 4  mm left renal calculus is likely a vascular coil. Electronically Signed   By: Titus Dubin M.D.   On: 12/23/2020 16:17   Result Date: 12/16/2020 CLINICAL DATA:  Diarrhea and weakness. EXAM: CT ABDOMEN AND PELVIS WITHOUT CONTRAST TECHNIQUE: Multidetector CT imaging of the abdomen and pelvis was performed following the standard protocol without IV contrast. COMPARISON:  None. FINDINGS: Lower chest: Small bilateral pleural effusions. Trace pericardial effusion. Mild bibasilar atelectasis/scarring. Hepatobiliary: Small calcification in the right hepatic lobe. No other focal liver abnormality. Mildly distended gallbladder without wall thickening or radiopaque gallstones. No biliary dilatation. Pancreas: Unremarkable. No pancreatic ductal dilatation or surrounding inflammatory changes. Spleen: Normal in size without focal abnormality. Adrenals/Urinary Tract: The adrenal glands and right kidney are unremarkable. 3.9 x 5.5 x 8.7 cm low-density left renal subcapsular fluid collection (series 2, image 36; series 6, image 83). 4 mm calculus in the left kidney. No hydronephrosis. The bladder is unremarkable. Stomach/Bowel: Moderate hiatal hernia. The stomach is otherwise within normal limits. Severe circumferential wall thickening of the entire colon. No pneumatosis. The small bowel is unremarkable. Normal appendix. Vascular/Lymphatic: Aortic atherosclerosis. No enlarged abdominal or pelvic lymph nodes. Reproductive: Prior hysterectomy. 3.9 cm simple appearing cyst in the right ovary. Other: Trace ascites around the liver, in both paracolic gutters, and in the pelvis. Prominent  presacral soft tissue stranding. No pneumoperitoneum. Musculoskeletal: No acute or significant osseous findings. IMPRESSION: 1. Severe circumferential wall thickening of the entire colon, consistent with pancolitis. 2. 8.7 cm low-density left renal subcapsular fluid collection, concerning for abscess. Correlate with urinalysis. 3. Trace ascites.  Small bilateral pleural effusions. 4. 3.9 cm simple appearing right ovarian cyst. Recommend outpatient follow-up with pelvic US. Reference: JACR 2020 Feb;17(2):248-254 5. Aortic Atherosclerosis (ICD10-I70.0). Electronically Signed: By: Titus Dubin M.D. On: 12/13/2020 16:01   DG Chest 1 View  Result Date: 01/13/2021 CLINICAL DATA:  70 year old female with history of shortness of breath. EXAM: CHEST  1 VIEW COMPARISON:  Chest x-ray 01/10/2021. FINDINGS: Lung volumes are low. Worsening patchy multifocal interstitial and airspace disease asymmetrically distributed throughout the lungs bilaterally, most confluent throughout the right mid to lower lung. Mild blunting of the costophrenic sulci bilaterally which may suggest trace bilateral pleural effusions. No pneumothorax. Pulmonary vasculature is largely obscured, but does not appearing origin. Heart size is normal. Upper mediastinal contours are within normal limits. Atherosclerotic calcifications in the thoracic aorta. IMPRESSION: 1. The appearance the chest is concerning for severely worsening multilobar bilateral pneumonia. Probable trace bilateral pleural effusions. 2. Aortic atherosclerosis. Electronically Signed   By: Vinnie Langton M.D.   On: 01/13/2021 06:26   DG Chest 1 View  Result Date: 01/10/2021 CLINICAL DATA:  70 year old female with history of shortness of breath. EXAM: CHEST  1 VIEW COMPARISON:  Chest x-ray 01/03/2021. FINDINGS: Lung volumes are low. Persistent ill-defined opacity in the medial aspect of the right upper lobe, unchanged. Left lung appears clear. Known small bilateral pleural  effusions are not readily apparent on this single AP view. No pneumothorax. No evidence of pulmonary edema. Heart size is borderline enlarged. Atherosclerotic calcifications in the thoracic aorta. IMPRESSION: 1. Persistent medial right upper lobe airspace consolidation concerning for pneumonia. Followup PA and lateral chest X-ray is recommended in 3-4 weeks following trial of antibiotic therapy to ensure resolution and exclude underlying malignancy. 2. Aortic atherosclerosis. 3. Small bilateral pleural effusions noted on yesterday's CT the abdomen and pelvis are not readily apparent on today's plain film examination. Electronically Signed   By: Mauri Brooklyn.D.  On: 01/10/2021 05:31   DG Abd 1 View  Result Date: 01/13/2021 CLINICAL DATA:  Weakness. EXAM: ABDOMEN - 1 VIEW COMPARISON:  January 11, 2021. FINDINGS: The bowel gas pattern is normal. Phleboliths are noted in the pelvis. Stable position of left femoral catheter. IMPRESSION: No abnormal bowel dilatation. Electronically Signed   By: Marijo Conception M.D.   On: 01/13/2021 15:51   MR BRAIN WO CONTRAST  Result Date: 01/11/2021 CLINICAL DATA:  Neuro deficit, acute, stroke suspected EXAM: MRI HEAD WITHOUT CONTRAST TECHNIQUE: Multiplanar, multiecho pulse sequences of the brain and surrounding structures were obtained without intravenous contrast. COMPARISON:  January 09, 2021. FINDINGS: Brain: Punctate focus of restricted diffusion in the right frontal white matter (series 5/6, image 36), suspicious for tiny acute infarct. No significant edema or mass effect. Moderate scattered T2 hyperintensities in the white matter, nonspecific but compatible with chronic microvascular disease. No evidence of acute hemorrhage, hydrocephalus, mass lesion, midline shift, or extra-axial fluid collection. Vascular: Major arterial flow voids are maintained at the skull base. Skull and upper cervical spine: Normal marrow signal. Sinuses/Orbits: Clear sinuses.  Unremarkable  orbits. Other: Small bilateral mastoid effusions. IMPRESSION: 1. Punctate focus of restricted diffusion in the right frontal white matter, suspicious for tiny acute infarct. No significant edema or mass effect. 2. Moderate chronic microvascular ischemic disease. Electronically Signed   By: Margaretha Sheffield M.D.   On: 01/11/2021 14:26   MR THORACIC SPINE WO CONTRAST  Result Date: 01/11/2021 CLINICAL DATA:  Right-sided weakness and lethargy. Possible myelopathy. EXAM: MRI THORACIC SPINE WITHOUT CONTRAST TECHNIQUE: Multiplanar, multisequence MR imaging of the thoracic spine was performed. No intravenous contrast was administered. COMPARISON:  None. FINDINGS: Alignment:  Normal Vertebrae: Normal marrow signal.  No bone lesions or fractures. Cord:  Normal cord signal intensity.  No cord lesions or syrinx. Paraspinal and other soft tissues: No significant paraspinal findings. There are moderate-sized bilateral pleural effusions noted. Disc levels: No significant disc protrusions, spinal or foraminal stenosis. IMPRESSION: 1. Unremarkable thoracic spine MRI examination. 2. Moderate-sized bilateral pleural effusions. Electronically Signed   By: Marijo Sanes M.D.   On: 01/11/2021 14:31   US Venous Img Lower Bilateral (DVT)  Result Date: 01/10/2021 CLINICAL DATA:  Bilateral lower extremity pain and edema. Evaluate for DVT. EXAM: BILATERAL LOWER EXTREMITY VENOUS DOPPLER ULTRASOUND TECHNIQUE: Gray-scale sonography with graded compression, as well as color Doppler and duplex ultrasound were performed to evaluate the lower extremity deep venous systems from the level of the common femoral vein and including the common femoral, femoral, profunda femoral, popliteal and calf veins including the posterior tibial, peroneal and gastrocnemius veins when visible. The superficial great saphenous vein was also interrogated. Spectral Doppler was utilized to evaluate flow at rest and with distal augmentation maneuvers in the common  femoral, femoral and popliteal veins. COMPARISON:  None. FINDINGS: RIGHT LOWER EXTREMITY Common Femoral Vein: No evidence of thrombus. Normal compressibility, respiratory phasicity and response to augmentation. Saphenofemoral Junction: No evidence of thrombus. Normal compressibility and flow on color Doppler imaging. Profunda Femoral Vein: No evidence of thrombus. Normal compressibility and flow on color Doppler imaging. Femoral Vein: No evidence of thrombus. Normal compressibility, respiratory phasicity and response to augmentation. Popliteal Vein: No evidence of thrombus. Normal compressibility, respiratory phasicity and response to augmentation. Calf Veins: No evidence of thrombus. Normal compressibility and flow on color Doppler imaging. Superficial Great Saphenous Vein: No evidence of thrombus. Normal compressibility. Venous Reflux:  None. Other Findings:  None. LEFT LOWER EXTREMITY Common Femoral Vein: Not visualized  secondary to bandage associated with left femoral approach central venous catheter. Saphenofemoral Junction: Not visualized secondary to bandages so shaded with left femoral approach central venous catheter. Profunda Femoral Vein: No evidence of thrombus. Normal compressibility and flow on color Doppler imaging. Femoral Vein: No evidence of thrombus. Normal compressibility, respiratory phasicity and response to augmentation. Popliteal Vein: No evidence of thrombus. Normal compressibility, respiratory phasicity and response to augmentation. Calf Veins: No evidence of thrombus. Normal compressibility and flow on color Doppler imaging. Superficial Great Saphenous Vein: No evidence of thrombus. Normal compressibility. Venous Reflux:  None. Other Findings:  None. IMPRESSION: No evidence of DVT within either lower extremity. Electronically Signed   By: Sandi Mariscal M.D.   On: 01/10/2021 08:12   DG Chest Port 1 View  Result Date: 01/15/2021 CLINICAL DATA:  Pneumonia. EXAM: PORTABLE CHEST 1 VIEW  COMPARISON:  Radiograph earlier today.  Chest CT yesterday FINDINGS: Stable heart size and mediastinal contours. Unchanged extent multifocal bilateral pulmonary opacities. Left pleural effusion is stable. A right pleural effusion was better demonstrated on prior CT. No pneumothorax. IMPRESSION: 1. Unchanged extent multifocal bilateral pulmonary opacities. This may represent multifocal pneumonia or ARDS. Stable radiographic appearance from earlier today. 2. Pleural effusions, not significantly changed. The right pleural effusion was better appreciated on CT. Electronically Signed   By: Keith Rake M.D.   On: 01/15/2021 18:13   DG Chest Port 1 View  Result Date: 01/15/2021 CLINICAL DATA:  Hypoxia EXAM: PORTABLE CHEST 1 VIEW COMPARISON:  Chest radiograph from one day prior. FINDINGS: Stable cardiomediastinal silhouette with top-normal heart size. No pneumothorax. Small left pleural effusion, slightly increased. No significant right pleural effusion. Extensive patchy opacities throughout both lungs, slightly worsened. IMPRESSION: 1. Extensive patchy opacities throughout both lungs, slightly worsened, compatible with multifocal pneumonia or ARDS. 2. Small left pleural effusion, slightly increased. Electronically Signed   By: Ilona Sorrel M.D.   On: 01/15/2021 07:09   DG Chest Port 1 View  Result Date: 01/14/2021 CLINICAL DATA:  Worsening hypoxia.  Pancolitis. EXAM: PORTABLE CHEST 1 VIEW COMPARISON:  01/13/2021 FINDINGS: Heart size remains within normal limits. Aortic atherosclerotic calcification noted. Increased heterogeneous airspace disease is seen bilaterally. Multiple ill-defined nodular opacities are also noted, which appear new. This raises possibility of septic emboli. No evidence of pleural effusion. IMPRESSION: Increased bilateral heterogeneous airspace disease with ill-defined nodular opacities. This raises possibility of septic emboli. Consider chest CTA for further evaluation. These results will  be called to the ordering clinician or representative by the Radiologist Assistant, and communication documented in the PACS or [employer]. Electronically Signed   By: Marlaine Hind M.D.   On: 01/14/2021 10:39   DG Chest Port 1 View  Result Date: 01/13/2021 CLINICAL DATA:  Pleural effusion.  Status post thoracentesis. EXAM: PORTABLE CHEST 1 VIEW COMPARISON:  01/13/2021 at 5:57 a.m. FINDINGS: The cardiomediastinal silhouette is unchanged with normal heart size. The lungs remain hypoinflated with interstitial and patchy airspace opacities bilaterally. Lung aeration as overall mildly improved, most notably in the right mid to lower lung. There may be trace bilateral pleural effusions. No pneumothorax is identified. IMPRESSION: Bilateral airspace disease with mildly improved aeration on the right. No pneumothorax. Electronically Signed   By: Logan Bores M.D.   On: 01/13/2021 14:33   DG Chest Port 1 View  Result Date: 12/14/2020 CLINICAL DATA:  Possible sepsis Right-sided weakness and lethargic EXAM: PORTABLE CHEST 1 VIEW COMPARISON:  01/07/2019 FINDINGS: Heart size within normal limits. Mild pulmonary vascular congestion. There is  new airspace opacity in the right suprahilar lung. IMPRESSION: New airspace opacity in the right suprahilar lung likely due to pneumonia. Radiographic follow-up to resolution is recommended to exclude developing mass. Electronically Signed   By: Miachel Roux M.D.   On: 01/06/2021 14:08   DG Abd Portable 1V  Result Date: 01/11/2021 CLINICAL DATA:  Colitis, weakness. EXAM: PORTABLE ABDOMEN - 1 VIEW COMPARISON:  CT abdomen pelvis 12/29/2020 FINDINGS: Scattered air is seen in the small bowel and colon with a nonobstructive bowel gas pattern. No free intraperitoneal air given the limits of the supine images. Left femoral central venous catheter tip projects at the level of the left common iliac vein. Left renal embolization coil. A 1 cm nodular opacity overlying the right lung  base is favored to represent a nipple shadow. IMPRESSION: 1. Nonobstructive bowel gas pattern. 2. Interval placement of left femoral central venous catheter with tip overlying the expected location of the left common iliac vein. Electronically Signed   By: Ileana Roup M.D.   On: 01/11/2021 08:53   DG Abd Portable 2V  Result Date: 01/14/2021 CLINICAL DATA:  70 year old female with colitis EXAM: PORTABLE ABDOMEN - 2 VIEW COMPARISON:  01/13/2021, abdominal CT 01/05/2021 FINDINGS: Overlying EKG leads somewhat limit the evaluation. Relative paucity of bowel gas, with paucity of gas in the stomach small bowel and colon. Trace gas present within left and right colon. No abnormal distension or air-fluid level. No unexpected radiopaque foreign body. Vascular calcifications present. Metallic coils project over the left renal silhouette similar to the prior CT. Left femoral vascular line. Degenerative changes of the spine, hips, bilateral sacroiliac joints. No acute displaced fracture. IMPRESSION: Nonspecific bowel gas pattern, with no definite evidence of obstruction. Left femoral vascular line Electronically Signed   By: Corrie Mckusick D.O.   On: 01/14/2021 10:48   ECHOCARDIOGRAM COMPLETE  Result Date: 01/10/2021    ECHOCARDIOGRAM REPORT   Patient Name:   MONNA CREAN Date of Exam: 01/10/2021 Medical Rec #:  [id number]  Height:       64.0 in Accession #:    [id number] Weight:       120.6 lb Date of Birth:  [birth year]   BSA:          1.578 m Patient Age:    70 years   BP:           94/58 mmHg Patient Gender: F          HR:           132 bpm. Exam Location:  [facility] Procedure: 2D Echo, Cardiac Doppler and Color Doppler Indications:     Atrial Fibrillation I48.91  History:         Patient has no prior history of Echocardiogram examinations.                  Arrythmias:Atrial Fibrillation; Risk Factors:Hypertension and                  Dyslipidemia.  Sonographer:     Sherrie Sport Referring Phys:  [id number] Kathrin Ruddy ORGEL  Diagnosing Phys: Donnelly Angelica  Sonographer Comments: Suboptimal apical window. IMPRESSIONS  1. Left ventricular ejection fraction, by estimation, is 60 to 65%. The left ventricle has normal function. The left ventricle has no regional wall motion abnormalities. Left ventricular diastolic parameters are indeterminate.  2. Right ventricular systolic function was not well visualized. The right ventricular size is not well visualized.  3. Left atrial size was mildly dilated.  4.  The mitral valve is normal in structure. No evidence of mitral valve regurgitation. No evidence of mitral stenosis.  5. The aortic valve was not well visualized. Aortic valve regurgitation is not visualized.  6. The inferior vena cava is normal in size with greater than 50% respiratory variability, suggesting right atrial pressure of 3 mmHg. FINDINGS  Left Ventricle: Left ventricular ejection fraction, by estimation, is 60 to 65%. The left ventricle has normal function. The left ventricle has no regional wall motion abnormalities. The left ventricular internal cavity size was small. There is no left ventricular hypertrophy. Left ventricular diastolic parameters are indeterminate. Right Ventricle: The right ventricular size is not well visualized. Right vetricular wall thickness was not well visualized. Right ventricular systolic function was not well visualized. Left Atrium: Left atrial size was mildly dilated. Right Atrium: Right atrial size was not well visualized. Pericardium: There is no evidence of pericardial effusion. Mitral Valve: The mitral valve is normal in structure. No evidence of mitral valve regurgitation. No evidence of mitral valve stenosis. MV peak gradient, 7.0 mmHg. The mean mitral valve gradient is 3.0 mmHg. Tricuspid Valve: The tricuspid valve is not well visualized. Tricuspid valve regurgitation is not demonstrated. Aortic Valve: The aortic valve was not well visualized. Aortic valve regurgitation is not visualized. Aortic  valve mean gradient measures 3.0 mmHg. Aortic valve peak gradient measures 6.9 mmHg. Aortic valve area, by VTI measures 2.54 cm. Pulmonic Valve: The pulmonic valve was not well visualized. Pulmonic valve regurgitation is not visualized. No evidence of pulmonic stenosis. Aorta: The aortic root was not well visualized. Venous: The inferior vena cava is normal in size with greater than 50% respiratory variability, suggesting right atrial pressure of 3 mmHg. IAS/Shunts: The interatrial septum was not well visualized.  LEFT VENTRICLE PLAX 2D LVIDd:         3.97 cm   Diastology LVIDs:         2.89 cm   LV e' medial:    9.68 cm/s LV PW:         1.23 cm   LV E/e' medial:  9.9 LV IVS:        0.85 cm   LV e' lateral:   10.80 cm/s LVOT diam:     2.00 cm   LV E/e' lateral: 8.8 LV SV:         38 LV SV Index:   24 LVOT Area:     3.14 cm  RIGHT VENTRICLE RV Basal diam:  3.53 cm RV S prime:     14.90 cm/s TAPSE (M-mode): 3.8 cm LEFT ATRIUM             Index        RIGHT ATRIUM           Index LA diam:        4.50 cm 2.85 cm/m   RA Area:     17.00 cm LA Vol (A2C):   44.1 ml 27.95 ml/m  RA Volume:   49.00 ml  31.05 ml/m LA Vol (A4C):   72.0 ml 45.63 ml/m LA Biplane Vol: 59.0 ml 37.39 ml/m  AORTIC VALVE                    PULMONIC VALVE AV Area (Vmax):    2.45 cm     PV Vmax:        1.04 m/s AV Area (Vmean):   2.76 cm     PV Vmean:  71.600 cm/s AV Area (VTI):     2.54 cm     PV VTI:         0.137 m AV Vmax:           131.00 cm/s  PV Peak grad:   4.3 mmHg AV Vmean:          79.800 cm/s  PV Mean grad:   2.0 mmHg AV VTI:            0.151 m      RVOT Peak grad: 4 mmHg AV Peak Grad:      6.9 mmHg AV Mean Grad:      3.0 mmHg LVOT Vmax:         102.00 cm/s LVOT Vmean:        70.000 cm/s LVOT VTI:          0.122 m LVOT/AV VTI ratio: 0.81  AORTA Ao Root diam: 2.80 cm MITRAL VALVE               TRICUSPID VALVE MV Area (PHT): 3.21 cm    TR Peak grad:   9.2 mmHg MV Area VTI:   2.25 cm    TR Vmax:        152.00 cm/s MV Peak grad:   7.0 mmHg MV Mean grad:  3.0 mmHg    SHUNTS MV Vmax:       1.32 m/s    Systemic VTI:  0.12 m MV Vmean:      81.1 cm/s   Systemic Diam: 2.00 cm MV Decel Time: 236 msec    Pulmonic VTI:  0.111 m MV E velocity: 95.50 cm/s MV A velocity: 83.10 cm/s MV E/A ratio:  1.15 Donnelly Angelica Electronically signed by Donnelly Angelica Signature Date/Time: 01/10/2021/1:17:36 PM    Final    CT HEAD CODE STROKE WO CONTRAST  Result Date: 12/29/2020 CLINICAL DATA:  Code stroke. EXAM: CT HEAD WITHOUT CONTRAST TECHNIQUE: Contiguous axial images were obtained from the base of the skull through the vertex without intravenous contrast. COMPARISON:  None. FINDINGS: Brain: There is no acute intracranial hemorrhage, mass effect, or edema. Gray-white differentiation is preserved. Ventricles and sulci are normal in size and configuration. Patchy low-density in the supratentorial white matter is nonspecific but may reflect chronic microvascular ischemic changes. No extra-axial collection. Vascular: No hyperdense vessel. There is intracranial atherosclerotic calcification at the skull base. Skull: Unremarkable. Sinuses/Orbits: No acute or significant abnormality. Other: Mastoid air cells are clear. ASPECTS ([place] Stroke Program Early CT Score) - Ganglionic level infarction (caudate, lentiform nuclei, internal capsule, insula, M1-M3 cortex): 7 - Supraganglionic infarction (M4-M6 cortex): 3 Total score (0-10 with 10 being normal): 10 IMPRESSION: There is no acute intracranial hemorrhage or evidence of acute infarction. ASPECT score is 10. Chronic microvascular ischemic changes. These results were called by telephone at the time of interpretation on 12/19/2020 at 10:10 am to provider [facility] , who verbally acknowledged these results. Electronically Signed   By: Macy Mis M.D.   On: 12/12/2020 10:11   CT CHEST ABDOMEN PELVIS WO CONTRAST  Result Date: 01/14/2021 CLINICAL DATA:  C difficile colitis and findings suspicious for pneumonia. EXAM:  CT CHEST, ABDOMEN AND PELVIS WITHOUT CONTRAST TECHNIQUE: Multidetector CT imaging of the chest, abdomen and pelvis was performed following the standard protocol without IV contrast. COMPARISON:  Chest and abdominal film from earlier in the same day as well as CT from 12/27/2020 and 09/08/2020. FINDINGS: CT CHEST FINDINGS Cardiovascular: Somewhat limited due to lack of  IV contrast. Aortic calcifications are noted without aneurysmal dilatation. No cardiac enlargement is seen. Coronary calcifications are noted. Minimal pericardial effusion is noted slightly increased from the prior exam. Mediastinum/Nodes: Thoracic inlet is within normal limits. No sizable hilar or mediastinal adenopathy is noted. The esophagus is well visualized and there is a moderate-sized sliding-type hiatal hernia identified. Lungs/Pleura: Lungs demonstrate patchy airspace opacity throughout both lungs with evidence of bronchiectatic changes throughout both lungs. These have worsened significantly in the interval from the prior exam from June of 2022. Previously seen nodular changes are not well appreciated due to the overlying infiltrates. Effusions right considerably greater than left are seen as well. No pneumothorax is noted. Musculoskeletal: No acute rib abnormality is noted. Degenerative changes of the thoracic spine are noted. CT ABDOMEN PELVIS FINDINGS Hepatobiliary: Liver shows no discrete mass lesion. Vicarious excretion of contrast is noted within the gallbladder from prior exams. Mild perihepatic ascites is noted. Pancreas: Unremarkable. No pancreatic ductal dilatation or surrounding inflammatory changes. Spleen: Normal in size without focal abnormality. Adrenals/Urinary Tract: Adrenal glands are within normal limits. Right kidney shows no renal calculi or obstructive changes. Left kidney demonstrates a 4 mm lower pole stones stable from the prior exam. No obstructive changes are seen. There is a fluid collection in the noted along the  posterior aspect of the left kidney measuring 5.1 cm in greatest dimension and predominately stable from the prior exam. This may simply represent a focal cyst although the possibility of sub capsular fluid collection deserves consideration although the overall appearance is stable. Bladder is decompressed by Foley catheter. Stomach/Bowel: Colon shows diffuse wall thickening consistent with the given clinical history of C difficile colitis. No obstructive changes are noted. No findings to suggest perforation are seen. Small bowel and stomach appear within normal limits aside from the previously mentioned hiatal hernia. Vascular/Lymphatic: Aortic atherosclerosis. No enlarged abdominal or pelvic lymph nodes. Right femoral venous line is noted. Reproductive: Status post hysterectomy. Right adnexal cyst is noted stable in appearance from the recent exam. Other: Ascites is noted throughout the abdomen increased from the recent examination of 6 days previous. This may simply be reactive in nature. Musculoskeletal: No acute or significant osseous findings. IMPRESSION: CT of the chest: Bronchiectatic changes with diffuse bilateral multifocal infiltrates consistent with multifocal pneumonia. Bilateral pleural effusions right greater than left are seen. CT of the abdomen and pelvis: Increase in ascites when compared with the recent exam although this may simply be reactive in nature given the changes in the colon. Diffuse wall thickening is noted throughout the colon consistent with the given clinical history of colitis. No perforation is identified. Hiatal hernia. Subcapsular fluid collection along the posterior aspect of the left kidney stable in appearance from the recent exam. Possibility of perinephric abscess could not be totally excluded. Stable 3.9 cm right adnexal cyst Recommend follow-up US in 6-12 months. Note: This recommendation does not apply to premenarchal patients and to those with increased risk (genetic,  family history, elevated tumor markers or other high-risk factors) of ovarian cancer. Reference: JACR 2020 Feb; 17(2):248-254 Electronically Signed   By: Inez Catalina M.D.   On: 01/14/2021 20:44   US THORACENTESIS ASP PLEURAL SPACE W/IMG GUIDE  Result Date: 01/13/2021 INDICATION: 70 year old with pleural effusions. EXAM: ULTRASOUND GUIDED LEFT THORACENTESIS MEDICATIONS: None. COMPLICATIONS: None immediate. PROCEDURE: An ultrasound guided thoracentesis was thoroughly discussed with the patient and questions answered. The benefits, risks, alternatives and complications were also discussed. The patient understands and wishes to proceed with the  procedure. Written consent was obtained. Ultrasound was performed to localize and mark an adequate pocket of fluid in the left chest. The area was then prepped and draped in the normal sterile fashion. 1% Lidocaine was used for local anesthesia. Under ultrasound guidance a 6 Fr Safe-T-Centesis catheter was introduced. Thoracentesis was performed. The catheter was removed and a dressing applied. FINDINGS: Ultrasound performed prior to the procedure demonstrated bilateral pleural effusions. A total of approximately 450 mL of yellow fluid was removed. Samples were sent to the laboratory as requested by the clinical team. IMPRESSION: Successful ultrasound guided left thoracentesis yielding 450 mL of pleural fluid. Electronically Signed   By: Markus Daft M.D.   On: 01/13/2021 16:57    ASSESSMENT: Pancytopenia.  PLAN:    Pancytopenia: Patient's neutropenia initially resolved, but now has trended back down.  She now has no worsening anemia and thrombocytopenia as well.  Unlikely unrelated to her Rituxan and Cytoxan infusion which occurred over 2 months ago.  Likely multifactorial given multiple medications as well as acute illness.  Patient also noted to have supratherapeutic digoxin levels.  Would consider bone marrow biopsy, but patient is on high flow oxygen via nasal  cannula and likely too unstable for transport.  Case discussed with pulmonology who plans to initiate IVIG.  Continue to monitor daily CBC.   Thrombocytopenia: Patient's platelet count has trended down over the last 5 days and is now 42.  HITT is a possibility and panel has been sent. Anemia: Monitor and transfuse if hemoglobin continues to drop and falls below 7.0. Septic shock/metabolic acidosis: Appreciate critical care input.  Continue aggressive fluid resuscitation, vasopressors, and antibiotics as ordered. Abdominal pain/C. difficile colitis: CT abdomen pelvis revealed pancolitis and possible left renal subcapsular fluid collection concerning for abscess.  Continue Flagyl as per critical care team. Disposition: Agree with palliative care consult.  Will follow.  Lloyd Huger, MD   01/16/2021 10:06 AM

## 2021-01-16 NOTE — TOC Initial Note (Addendum)
Transition of Care [facility]) - Initial/Assessment Note    Patient Details  Name: Lauren Bradshaw MRN: [id number] Date of Birth: June 12, 1950  Transition of Care [facility]) CM/SW Contact:    Marina Goodell Phone Number: [phone] 01/16/2021, 11:51 AM  Clinical Narrative:                  Patient presents to [facility] from home due to generalized fatigue/weakness, pt endorsed right side weakness.  Patient's daughter-in-law stated the patient has been admitted to [facility] several times since August. Patient currently on 40LHCNC.  Palliative Care NP spoke with patient, with family present and patient has decided on hospice care at [facility] of the [facility] in [facility]. Palliative Care NP contacted CSW, requesting referral to Hospice Home in [facility], per patient request and to confirm minimum O2 requirements for admittance to hospice home and transportation. CSW spoke with Hillary at [facility], who stated their minium O2 requirements are 30LHFNC and 15LHFNC for transportation.  Patient is currently at 40LHFNC, and has not been able to wean O2. Palliative Care NP did discuss with patient and family, the possibility of patient's inability to wean and remaining at the hospital w/ comfort care.  Family and patient verbalized understanding. If patient is able to wean to minimum requirements for transportation, CSW will update Hillary at [facility]. Main contacts Lucrezia Starch (Daughter-in-Law) [phone]  and Claiborne Billings [phone]. TOC will continue to follow.  Expected Discharge Plan: Hospice Medical Facility Barriers to Discharge: Other (must enter comment) (Patient currently at 40L HFNC, hospice facility minimum O2 requirements 30LHFNC, and 15 HFNC for transport)   Patient Goals and CMS Choice Patient states their goals for this hospitalization and ongoing recovery are:: Patient resqueted hospice care, w/ [facility] [place], at [facility] in [place].      Expected  Discharge Plan and Services Expected Discharge Plan: Hospice Medical Facility In-house Referral: Clinical Social Work   Post Acute Care Choice: Hospice [facility] in [place].) Living arrangements for the past 2 months: Single Family Home                                      Prior Living Arrangements/Services Living arrangements for the past 2 months: Single Family Home Lives with:: Self Patient language and need for interpreter reviewed:: Yes Do you feel safe going back to the place where you live?: Yes      Need for Family Participation in Patient Care: Yes (Comment) Care giver support system in place?: Yes (comment)   Criminal Activity/Legal Involvement Pertinent to Current Situation/Hospitalization: No - Comment as needed  Activities of Daily Living      Permission Sought/Granted Permission sought to share information with : Family Supports    Share Information with NAME: Lucrezia Starch (Daughter)   [phone] (Mobile)  Permission granted to share info w AGENCY: [facility]        Emotional Assessment Appearance:: Appears stated age Attitude/Demeanor/Rapport: Engaged Affect (typically observed): Stable Orientation: : Oriented to Self, Oriented to Place, Oriented to  Time, Oriented to Situation Alcohol / Substance Use: Not Applicable Psych Involvement: No (comment)  Admission diagnosis:  Dehydration [E86.0] Hypovolemic shock (HCC) [R57.1] Hyponatremia [E87.1] Weakness [R53.1] Atrial fibrillation with rapid ventricular response (HCC) [I48.91] Hypotension, unspecified hypotension type [I95.9] Diarrhea, unspecified type [R19.7] Sepsis, due to unspecified organism, unspecified whether acute organ dysfunction present (  [place]) [A41.9] Patient Active Problem List   Diagnosis Date Noted   Colitis due to Clostridium difficile    Diarrhea    Hypovolemic shock ([place]) 12/21/2020   Atrial fibrillation with rapid ventricular response ([place])     Dehydration    Sepsis ([place])    PCP:  Kateri Mc, MD Pharmacy:  No Pharmacies Listed    Social Determinants of Health (SDOH) Interventions    Readmission Risk Interventions No flowsheet data found.

## 2021-01-16 NOTE — Progress Notes (Signed)
[facility] SURGICAL PROGRESS NOTE (cpt [phone])  Hospital Day(s): 7.   Interval History: Patient seen and examined, no acute events or new complaints overnight. Seems respiratory status continues to be major issue. She is on HFNC this morning and is asking for "her pulmonologist to see her." She still has some left > right abdominal soreness, but this seems to be improved compared to previous examination. No feer, chills, nausea. She continues to be leukopenic to 2.6. Hgb to 7.1. Platelets continue to be low at 42. No electrolyte derangements. UO - 1.6L. She continues on Cefepime, Flagyl and PO Vancomycin. She is on CLD and TPN.   Review of Systems:  Constitutional: denies fever, chills  HEENT: denies cough or congestion  Respiratory: + shortness of breath  Cardiovascular: denies chest pain or palpitations  Gastrointestinal: + abdominal pain, denied N/V Genitourinary: denies burning with urination or urinary frequency  Vital signs in last 24 hours: [min-max] current  Temp:  [97.6 F (36.4 C)-98.5 F (36.9 C)] 98.5 F (36.9 C) (11/07 0400) Pulse Rate:  [47-105] 101 (11/07 0600) Resp:  [25-43] 27 (11/07 0600) BP: (75-125)/(53-78) 105/64 (11/07 0600) SpO2:  [92 %-99 %] 97 % (11/07 0600) FiO2 (%):  [65 %-68 %] 65 % (11/07 0449) Weight:  [57.8 kg] 57.8 kg (11/06 1100)     Height: 5\' 4"  (162.6 cm) Weight: 57.8 kg BMI (Calculated): 21.86   Intake/Output last 2 shifts:  11/06 0701 - 11/07 0700 In: 1062.8 [I.V.:423.4; IV Piggyback:639.4] Out: 1610 [Urine:1610]   Physical Exam:  Constitutional: alert, cooperative and no distress  HENT: normocephalic without obvious abnormality  Eyes: PERRL, EOM's grossly intact and symmetric  Respiratory: On HFNC Cardiovascular: regular rate and sinus rhythm  Gastrointestinal: Soft, left > right sided tenderness but again seems to be stable s improved, non-distended, no rebound/guarding.  Musculoskeletal: pitting edema bilateral LE, motor  and sensation grossly intact, NT    Labs:  CBC Latest Ref Rng & Units 01/16/2021 01/15/2021 01/14/2021  WBC 4.0 - 10.5 K/uL 2.6(L) 3.9(L) 5.7  Hemoglobin 12.0 - 15.0 g/dL 7.1(L) 9.0(L) 9.2(L)  Hematocrit 36.0 - 46.0 % 21.8(L) 27.5(L) 26.8(L)  Platelets 150 - 400 K/uL 42(L) 60(L) 94(L)   CMP Latest Ref Rng & Units 01/15/2021 01/15/2021 01/15/2021  Glucose 70 - 99 mg/dL - 13/08/2020) -  BUN 8 - 23 mg/dL - 983(J) -  Creatinine 82(N - 1.00 mg/dL - 0.53) -  Sodium 9.76(B - 145 mmol/L - 133(L) -  Potassium 3.5 - 5.1 mmol/L 4.6 3.1(L) 3.1(L)  Chloride 98 - 111 mmol/L - 102 -  CO2 22 - 32 mmol/L - 23 -  Calcium 8.9 - 10.3 mg/dL - 7.4(L) -  Total Protein 6.5 - 8.1 g/dL - 4.0(L) -  Total Bilirubin 0.3 - 1.2 mg/dL - 1.2 -  Alkaline Phos 38 - 126 U/L - 455(H) -  AST 15 - 41 U/L - 17 -  ALT 0 - 44 U/L - 10 -     Imaging studies: No new pertinent imaging studies   Assessment/Plan: (ICD-10's: A77.72) 70 y.o. female admitted with pancolitis secondary to Clostridium difficile, without overt evidence of toxic megacolon or impending perforation currently, complicated by atrial fibrillation with RVR (improving) and now acute hypoxic respiratory failure.     - From an abdominal perspective, she has continued to make gradual improvements without deterioration. She remains without evidence of perforation, abscess, or megacolon. For now, we can hold off on surgical intervention, but we will continue to monitor closely.  -  Continue CLD as tolerated - Continue TPN - Continue IV Cefepime, Flagyl and PO Vancomycin              - Monitor abdominal examination closely +/- serial AXR              - Continue IVF support - Monitor leukopenia - Monitor renal function; UO - Appreciate cardiology assistance - Appreciate PCCM assistance; we will follow     All of the above findings and recommendations were discussed with the patient, and the medical team, and all of patient's questions were answered to her expressed  satisfaction.   -- Edison Simon, PA-C [facility] 01/16/2021, 7:17 AM [phone] M-F: 7am - 4pm

## 2021-01-16 NOTE — Progress Notes (Signed)
SUBJECTIVE: Patient still very short of breath   Vitals:   01/16/21 0500 01/16/21 0600 01/16/21 0700 01/16/21 0723  BP: 106/64 105/64 108/65   Pulse: 96 (!) 101 93   Resp: (!) 28 (!) 27 (!) 27   Temp:      TempSrc:      SpO2: 96% 97% 98% 93%  Weight:      Height:        Intake/Output Summary (Last 24 hours) at 01/16/2021 0743 Last data filed at 01/15/2021 2200 Gross per 24 hour  Intake 1062.81 ml  Output 1610 ml  Net -547.19 ml    LABS: Basic Metabolic Panel: Recent Labs    01/14/21 0536 01/14/21 1836 01/15/21 0412 01/15/21 1453 01/16/21 0447  NA 131*  --  133*  --   --   K 3.6   < > 3.1*  3.1* 4.6  --   CL 97*  --  102  --   --   CO2 22  --  23  --   --   GLUCOSE 78  --  150*  --   --   BUN 38*  --  41*  --   --   CREATININE 2.17*  --  2.01*  --   --   CALCIUM 7.6*  --  7.4*  --   --   MG 2.5*  --  2.2  --  2.2  PHOS 4.3  --  3.1  --  3.5   < > = values in this interval not displayed.   Liver Function Tests: Recent Labs    01/14/21 0536 01/15/21 0412  AST 10* 17  ALT 7 10  ALKPHOS 459* 455*  BILITOT 0.9 1.2  PROT 4.1* 4.0*  ALBUMIN 1.6* 2.1*   No results for input(s): LIPASE, AMYLASE in the last 72 hours. CBC: Recent Labs    01/15/21 0412 01/16/21 0447  WBC 3.9* 2.6*  NEUTROABS 2.8 1.9  HGB 9.0* 7.1*  HCT 27.5* 21.8*  MCV 84.4 85.2  PLT 60* 42*   Cardiac Enzymes: No results for input(s): CKTOTAL, CKMB, CKMBINDEX, TROPONINI in the last 72 hours. BNP: Invalid input(s): POCBNP D-Dimer: No results for input(s): DDIMER in the last 72 hours. Hemoglobin A1C: No results for input(s): HGBA1C in the last 72 hours. Fasting Lipid Panel: Recent Labs    01/16/21 0447  TRIG 181*   Thyroid Function Tests: No results for input(s): TSH, T4TOTAL, T3FREE, THYROIDAB in the last 72 hours.  Invalid input(s): FREET3 Anemia Panel: No results for input(s): VITAMINB12, FOLATE, FERRITIN, TIBC, IRON, RETICCTPCT in the last 72 hours.   PHYSICAL  EXAM General: Well developed, well nourished, in no acute distress HEENT:  Normocephalic and atramatic Neck:  No JVD.  Lungs: Clear bilaterally to auscultation and percussion. Heart: HRRR . Normal S1 and S2 without gallops or murmurs.  Abdomen: Bowel sounds are positive, abdomen soft and non-tender  Msk:  Back normal, normal gait. Normal strength and tone for age. Extremities: No clubbing, cyanosis or edema.   Neuro: Alert and oriented X 3. Psych:  Good affect, responds appropriately  TELEMETRY: Sinus rhythm 98 bpm  ASSESSMENT AND PLAN: Status post atrial fibrillation with rapid ventricular response rate currently in sinus rhythm.  Continue digoxin 0.25 once a day.  Active Problems:   Hypovolemic shock (HCC)   Colitis due to Clostridium difficile   Diarrhea    Laurier Nancy, MD, [facility] 01/16/2021 7:43 AM

## 2021-01-16 NOTE — Progress Notes (Signed)
PT Cancellation Note  Patient Details Name: Lauren Bradshaw MRN: [id number] DOB: [birth year]   Cancelled Treatment:    Reason Eval/Treat Not Completed: Patient declined, no reason specified  Per chart review, PT orders cancelled by MD due to change in medical status. Consult appreciated, re-order PT if appropriate and pt agreeable.   Verl Blalock, SPT  Verl Blalock 01/16/2021, 11:38 AM

## 2021-01-16 NOTE — Progress Notes (Signed)
Comfort care measures inititated as ordered

## 2021-01-16 NOTE — Progress Notes (Signed)
[facility]  ROUNDING NOTE   Subjective:  Patient remains critically ill. Currently on high flow nasal cannula. However renal function actually appears to be improving with creatinine down to 1.7 with an EGFR of 33. Patient's son and daughter-in-law are at the bedside and able to provide additional history regarding her recent vasculitis. It appears that the patient did receive 2 doses of rituximab as well as 1 additional dose of cyclophosphamide.  Apparently the patient did not tolerate the cyclophosphamide very well.   Objective:  Vital signs in last 24 hours:  Temp:  [97.6 F (36.4 C)-98.6 F (37 C)] 98.6 F (37 C) (11/07 0800) Pulse Rate:  [52-107] 52 (11/07 0900) Resp:  [25-39] 31 (11/07 0900) BP: (75-117)/(53-69) 117/66 (11/07 0900) SpO2:  [90 %-99 %] 92 % (11/07 0900) FiO2 (%):  [50 %-65 %] 50 % (11/07 0723) Weight:  [57.8 kg] 57.8 kg (11/06 1100)  Weight change:  Filed Weights   12/14/2020 1013 01/15/21 1100  Weight: 54.7 kg 57.8 kg    Intake/Output: I/O last 3 completed shifts: In: 1062.8 [I.V.:423.4; IV Piggyback:639.4] Out: 1245 [Urine:1610]   Intake/Output this shift:  No intake/output data recorded.  Physical Exam: General: Critically ill-appearing  Head: Normocephalic, atraumatic. Moist oral mucosal membranes, high flow nasal cannula on  Eyes: Anicteric  Neck: Supple  Lungs:  Clear to auscultation, normal effort  Heart: S1S2 irregular  Abdomen:  Soft, nontender, bowel sounds present  Extremities: Trace peripheral edema.  Neurologic: Awake, alert, following commands  Skin: No acute rash  Access: No hemodialysis access    Basic Metabolic Panel: Recent Labs  Lab 01/12/21 0330 01/13/21 0306 01/13/21 1453 01/14/21 0536 01/14/21 1836 01/15/21 0412 01/15/21 1453 01/16/21 0447  NA 128* 129*  --  131*  --  133*  --  135  K 3.7 3.3*   < > 3.6 3.3* 3.1*  3.1* 4.6 4.0  CL 93* 93*  --  97*  --  102  --  103  CO2 24 21*  --  22  --  23   --  24  GLUCOSE 112* 115*  --  78  --  150*  --  164*  BUN 30* 34*  --  38*  --  41*  --  42*  CREATININE 1.82* 1.88*  --  2.17*  --  2.01*  --  1.69*  CALCIUM 7.1* 7.7*  --  7.6*  --  7.4*  --  8.0*  MG 2.5* 2.5*  --  2.5*  --  2.2  --  2.2  PHOS 3.5 3.9  --  4.3  --  3.1  --  3.5   < > = values in this interval not displayed.    Liver Function Tests: Recent Labs  Lab 01/10/21 1425 01/13/21 0306 01/14/21 0536 01/15/21 0412 01/16/21 0447  AST 18 11* 10* 17 10*  ALT '10 9 7 10 10  ' ALKPHOS 130* 409* 459* 455* 353*  BILITOT 0.8 0.9 0.9 1.2 1.1  PROT 3.8* 4.2* 4.1* 4.0* 4.3*  ALBUMIN 1.6* 1.7* 1.6* 2.1* 2.6*   Recent Labs  Lab 01/08/2021 1939  LIPASE 19  AMYLASE 13*   No results for input(s): AMMONIA in the last 168 hours.  CBC: Recent Labs  Lab 01/11/21 0450 01/12/21 0330 01/13/21 0306 01/14/21 0536 01/15/21 0412 01/16/21 0447  WBC 2.5* 5.1 7.8 5.7 3.9* 2.6*  NEUTROABS 1.5*  --  5.9 4.2 2.8 1.9  HGB 9.1* 9.5* 9.9* 9.2* 9.0* 7.1*  HCT 26.2*  28.3* 29.1* 26.8* 27.5* 21.8*  MCV 82.6 83.5 82.2 82.2 84.4 85.2  PLT 156 123* 121* 94* 60* 42*    Cardiac Enzymes: No results for input(s): CKTOTAL, CKMB, CKMBINDEX, TROPONINI in the last 168 hours.  BNP: Invalid input(s): POCBNP  CBG: Recent Labs  Lab 01/15/21 1537 01/15/21 1938 01/15/21 2134 01/16/21 0404 01/16/21 0752  GLUCAP 105* 139* 135* 175* 155*    Microbiology: Results for orders placed or performed during the hospital encounter of 12/10/2020  Resp Panel by RT-PCR (Flu A&B, Covid) Nasopharyngeal Swab     Status: None   Collection Time: 01/07/2021 10:14 AM   Specimen: Nasopharyngeal Swab; Nasopharyngeal(NP) swabs in vial transport medium  Result Value Ref Range Status   SARS Coronavirus 2 by RT PCR NEGATIVE NEGATIVE Final    Comment: (NOTE) SARS-CoV-2 target nucleic acids are NOT DETECTED.  The SARS-CoV-2 RNA is generally detectable in upper respiratory specimens during the acute phase of infection. The  lowest concentration of SARS-CoV-2 viral copies this assay can detect is 138 copies/mL. A negative result does not preclude SARS-Cov-2 infection and should not be used as the sole basis for treatment or other patient management decisions. A negative result may occur with  improper specimen collection/handling, submission of specimen other than nasopharyngeal swab, presence of viral mutation(s) within the areas targeted by this assay, and inadequate number of viral copies(<138 copies/mL). A negative result must be combined with clinical observations, patient history, and epidemiological information. The expected result is Negative.  Fact Sheet for Patients:  EntrepreneurPulse.com.au  Fact Sheet for Healthcare Providers:  IncredibleEmployment.be  This test is no t yet approved or cleared by the [place] FDA and  has been authorized for detection and/or diagnosis of SARS-CoV-2 by FDA under an Emergency Use Authorization (EUA). This EUA will remain  in effect (meaning this test can be used) for the duration of the COVID-19 declaration under Section 564(b)(1) of the Act, 21 U.S.C.section 360bbb-3(b)(1), unless the authorization is terminated  or revoked sooner.       Influenza A by PCR NEGATIVE NEGATIVE Final   Influenza B by PCR NEGATIVE NEGATIVE Final    Comment: (NOTE) The Xpert Xpress SARS-CoV-2/FLU/RSV plus assay is intended as an aid in the diagnosis of influenza from Nasopharyngeal swab specimens and should not be used as a sole basis for treatment. Nasal washings and aspirates are unacceptable for Xpert Xpress SARS-CoV-2/FLU/RSV testing.  Fact Sheet for Patients: EntrepreneurPulse.com.au  Fact Sheet for Healthcare Providers: IncredibleEmployment.be  This test is not yet approved or cleared by the [place] FDA and has been authorized for detection and/or diagnosis of SARS-CoV-2 by FDA under  an Emergency Use Authorization (EUA). This EUA will remain in effect (meaning this test can be used) for the duration of the COVID-19 declaration under Section 564(b)(1) of the Act, 21 U.S.C. section 360bbb-3(b)(1), unless the authorization is terminated or revoked.  Performed at [facility], [place],  [address]   C Difficile Quick Screen w PCR reflex     Status: Abnormal   Collection Time: 12/16/2020 10:14 AM   Specimen: STOOL  Result Value Ref Range Status   C Diff antigen POSITIVE (A) NEGATIVE Final   C Diff toxin POSITIVE (A) NEGATIVE Final   C Diff interpretation Toxin producing C. difficile detected.  Final    Comment: POSITIVE CRITICAL RESULT CALLED TO, READ BACK BY AND VERIFIED WITH: OLIVIA PRILLIEM 12/27/2020 1715 MU Performed at [facility], [place], [place]  [address]   Gastrointestinal Panel by PCR , Stool     Status: None   Collection Time: 12/29/2020 10:14 AM   Specimen: STOOL  Result Value Ref Range Status   Campylobacter species NOT DETECTED NOT DETECTED Final   Plesimonas shigelloides NOT DETECTED NOT DETECTED Final   Salmonella species NOT DETECTED NOT DETECTED Final   Yersinia enterocolitica NOT DETECTED NOT DETECTED Final   Vibrio species NOT DETECTED NOT DETECTED Final   Vibrio cholerae NOT DETECTED NOT DETECTED Final   Enteroaggregative E coli (EAEC) NOT DETECTED NOT DETECTED Final   Enteropathogenic E coli (EPEC) NOT DETECTED NOT DETECTED Final   Enterotoxigenic E coli (ETEC) NOT DETECTED NOT DETECTED Final   Shiga like toxin producing E coli (STEC) NOT DETECTED NOT DETECTED Final   Shigella/Enteroinvasive E coli (EIEC) NOT DETECTED NOT DETECTED Final   Cryptosporidium NOT DETECTED NOT DETECTED Final   Cyclospora cayetanensis NOT DETECTED NOT DETECTED Final   Entamoeba histolytica NOT DETECTED NOT DETECTED Final   Giardia lamblia NOT DETECTED NOT DETECTED Final   Adenovirus F40/41 NOT DETECTED NOT  DETECTED Final   Astrovirus NOT DETECTED NOT DETECTED Final   Norovirus GI/GII NOT DETECTED NOT DETECTED Final   Rotavirus A NOT DETECTED NOT DETECTED Final   Sapovirus (I, II, IV, and V) NOT DETECTED NOT DETECTED Final    Comment: Performed at [facility], [address] [place], [place] [address]  Blood Culture (routine x 2)     Status: None   Collection Time: 12/25/2020  1:49 PM   Specimen: BLOOD  Result Value Ref Range Status   Specimen Description BLOOD LEFT ANTECUBITAL  Final   Special Requests   Final    BOTTLES DRAWN AEROBIC ONLY Blood Culture results may not be optimal due to an inadequate volume of blood received in culture bottles   Culture   Final    NO GROWTH 5 DAYS Performed at [facility], [address] [place], [place] [address]    Report Status 01/14/2021 FINAL  Final  Blood Culture (routine x 2)     Status: None   Collection Time: 01/04/2021  2:16 PM   Specimen: BLOOD  Result Value Ref Range Status   Specimen Description BLOOD BLOOD RIGHT ARM  Final   Special Requests   Final    BOTTLES DRAWN AEROBIC AND ANAEROBIC Blood Culture adequate volume   Culture   Final    NO GROWTH 5 DAYS Performed at [facility], [address] [place], [place] [address]    Report Status 01/14/2021 FINAL  Final  MRSA Next Gen by PCR, Nasal     Status: None   Collection Time: 12/28/2020  6:05 PM   Specimen: Nasal Mucosa; Nasal Swab  Result Value Ref Range Status   MRSA by PCR Next Gen NOT DETECTED NOT DETECTED Final    Comment: (NOTE) The GeneXpert MRSA Assay (FDA approved for NASAL specimens only), is one component of a comprehensive MRSA colonization surveillance program. It is not intended to diagnose MRSA infection nor to guide or monitor treatment for MRSA infections. Test performance is not FDA approved in patients less than 24 years old. Performed at [facility], [address], [place], [place] [address]   Urine Culture      Status: None   Collection Time: 01/10/21  8:07 AM   Specimen: In/Out Cath Urine  Result Value Ref Range Status   Specimen Description   Final    IN/OUT CATH URINE Performed  at [facility], [address] [place], [place] [address]    Special Requests   Final    NONE Performed at [facility], [address] [place], [place] [address]    Culture   Final    NO GROWTH Performed at [facility], [place] [address] [place], [place] [address]    Report Status 01/11/2021 FINAL  Final  Body fluid culture w Gram Stain     Status: None (Preliminary result)   Collection Time: 01/13/21  2:45 PM   Specimen: PATH Cytology Pleural fluid  Result Value Ref Range Status   Specimen Description   Final    PLEURAL Performed at [facility], [address], [place], [place] [address]    Special Requests   Final    PLEURAL Performed at [facility], [address] [place], [place] [address]    Gram Stain   Final    NO SQUAMOUS EPITHELIAL CELLS SEEN NO WBC SEEN NO ORGANISMS SEEN    Culture   Final    NO GROWTH 3 DAYS Performed at [facility], [place] [address], [place], [place] [address]    Report Status PENDING  Incomplete    Coagulation Studies: No results for input(s): LABPROT, INR in the last 72 hours.  Urinalysis: No results for input(s): COLORURINE, LABSPEC, PHURINE, GLUCOSEU, HGBUR, BILIRUBINUR, KETONESUR, PROTEINUR, UROBILINOGEN, NITRITE, LEUKOCYTESUR in the last 72 hours.  Invalid input(s): APPERANCEUR    Imaging: DG Chest Port 1 View  Result Date: 01/15/2021 CLINICAL DATA:  Pneumonia. EXAM: PORTABLE CHEST 1 VIEW COMPARISON:  Radiograph earlier today.  Chest CT yesterday FINDINGS: Stable heart size and mediastinal contours. Unchanged extent multifocal bilateral pulmonary opacities. Left pleural effusion is stable. A right pleural effusion was better demonstrated on prior CT. No pneumothorax. IMPRESSION: 1. Unchanged extent  multifocal bilateral pulmonary opacities. This may represent multifocal pneumonia or ARDS. Stable radiographic appearance from earlier today. 2. Pleural effusions, not significantly changed. The right pleural effusion was better appreciated on CT. Electronically Signed   By: Keith Rake M.D.   On: 01/15/2021 18:13   DG Chest Port 1 View  Result Date: 01/15/2021 CLINICAL DATA:  Hypoxia EXAM: PORTABLE CHEST 1 VIEW COMPARISON:  Chest radiograph from one day prior. FINDINGS: Stable cardiomediastinal silhouette with top-normal heart size. No pneumothorax. Small left pleural effusion, slightly increased. No significant right pleural effusion. Extensive patchy opacities throughout both lungs, slightly worsened. IMPRESSION: 1. Extensive patchy opacities throughout both lungs, slightly worsened, compatible with multifocal pneumonia or ARDS. 2. Small left pleural effusion, slightly increased. Electronically Signed   By: Ilona Sorrel M.D.   On: 01/15/2021 07:09   CT CHEST ABDOMEN PELVIS WO CONTRAST  Result Date: 01/14/2021 CLINICAL DATA:  C difficile colitis and findings suspicious for pneumonia. EXAM: CT CHEST, ABDOMEN AND PELVIS WITHOUT CONTRAST TECHNIQUE: Multidetector CT imaging of the chest, abdomen and pelvis was performed following the standard protocol without IV contrast. COMPARISON:  Chest and abdominal film from earlier in the same day as well as CT from 01/02/2021 and 09/08/2020. FINDINGS: CT CHEST FINDINGS Cardiovascular: Somewhat limited due to lack of IV contrast. Aortic calcifications are noted without aneurysmal dilatation. No cardiac enlargement is seen. Coronary calcifications are noted. Minimal pericardial effusion is noted slightly increased from the prior exam. Mediastinum/Nodes: Thoracic inlet is within normal limits. No sizable hilar or mediastinal adenopathy is noted. The esophagus is well visualized and there is a moderate-sized sliding-type hiatal hernia identified. Lungs/Pleura: Lungs  demonstrate  patchy airspace opacity throughout both lungs with evidence of bronchiectatic changes throughout both lungs. These have worsened significantly in the interval from the prior exam from June of 2022. Previously seen nodular changes are not well appreciated due to the overlying infiltrates. Effusions right considerably greater than left are seen as well. No pneumothorax is noted. Musculoskeletal: No acute rib abnormality is noted. Degenerative changes of the thoracic spine are noted. CT ABDOMEN PELVIS FINDINGS Hepatobiliary: Liver shows no discrete mass lesion. Vicarious excretion of contrast is noted within the gallbladder from prior exams. Mild perihepatic ascites is noted. Pancreas: Unremarkable. No pancreatic ductal dilatation or surrounding inflammatory changes. Spleen: Normal in size without focal abnormality. Adrenals/Urinary Tract: Adrenal glands are within normal limits. Right kidney shows no renal calculi or obstructive changes. Left kidney demonstrates a 4 mm lower pole stones stable from the prior exam. No obstructive changes are seen. There is a fluid collection in the noted along the posterior aspect of the left kidney measuring 5.1 cm in greatest dimension and predominately stable from the prior exam. This may simply represent a focal cyst although the possibility of sub capsular fluid collection deserves consideration although the overall appearance is stable. Bladder is decompressed by Foley catheter. Stomach/Bowel: Colon shows diffuse wall thickening consistent with the given clinical history of C difficile colitis. No obstructive changes are noted. No findings to suggest perforation are seen. Small bowel and stomach appear within normal limits aside from the previously mentioned hiatal hernia. Vascular/Lymphatic: Aortic atherosclerosis. No enlarged abdominal or pelvic lymph nodes. Right femoral venous line is noted. Reproductive: Status post hysterectomy. Right adnexal cyst is noted  stable in appearance from the recent exam. Other: Ascites is noted throughout the abdomen increased from the recent examination of 6 days previous. This may simply be reactive in nature. Musculoskeletal: No acute or significant osseous findings. IMPRESSION: CT of the chest: Bronchiectatic changes with diffuse bilateral multifocal infiltrates consistent with multifocal pneumonia. Bilateral pleural effusions right greater than left are seen. CT of the abdomen and pelvis: Increase in ascites when compared with the recent exam although this may simply be reactive in nature given the changes in the colon. Diffuse wall thickening is noted throughout the colon consistent with the given clinical history of colitis. No perforation is identified. Hiatal hernia. Subcapsular fluid collection along the posterior aspect of the left kidney stable in appearance from the recent exam. Possibility of perinephric abscess could not be totally excluded. Stable 3.9 cm right adnexal cyst Recommend follow-up US in 6-12 months. Note: This recommendation does not apply to premenarchal patients and to those with increased risk (genetic, family history, elevated tumor markers or other high-risk factors) of ovarian cancer. Reference: JACR 2020 Feb; 17(2):248-254 Electronically Signed   By: Inez Catalina M.D.   On: 01/14/2021 20:44     Medications:    sodium chloride Stopped (01/06/2021 1620)   sodium chloride     dexmedetomidine (PRECEDEX) IV infusion 0.2 mcg/kg/hr (01/16/21 1751)   Immune Globulin 10%     lactated ringers 30 mL/hr at 01/15/21 1737   metronidazole 500 mg (01/16/21 0334)   TPN ADULT (ION) 33 mL/hr at 01/15/21 1734    (feeding supplement) PROSource Plus  30 mL Oral BID BM   atovaquone  750 mg Oral BID WC   budesonide (PULMICORT) nebulizer solution  0.5 mg Nebulization BID   Chlorhexidine Gluconate Cloth  6 each Topical Daily   feeding supplement  1 Container Oral BID BM   insulin aspart  0-6 Units  Subcutaneous Q8H    ipratropium-albuterol  3 mL Nebulization Q4H   levothyroxine  75 mcg Oral Q0600   mouth rinse  15 mL Mouth Rinse BID   methylPREDNISolone (SOLU-MEDROL) injection  40 mg Intravenous Q12H   pantoprazole (PROTONIX) IV  40 mg Intravenous Q24H   sodium chloride flush  3 mL Intravenous Q12H   vancomycin  500 mg Oral Q6H   zolpidem  5 mg Oral QHS   sodium chloride, fentaNYL (SUBLIMAZE) injection, levalbuterol, melatonin, ondansetron (ZOFRAN) IV, sodium chloride flush  Assessment/ Plan:  70 y.o. female with past medical history of C. difficile colitis, atrial fibrillation, hypertension, hyperlipidemia, history of ANCA vasculitis, pleural effusions was originally admitted with dehydration, hypovolemic shock, hyponatremia, atrial fibrillation.  1.  Acute kidney injury/chronic kidney disease stage IIIb baseline creatinine 1.6 with an EGFR of 35/ANCA vasculitis status post 1 dose of cyclophosphamide which the patient did not tolerate and 2 doses of rituximab.  It actually appears that the patient's renal function is improving now.  Creatinine down to 1.7 with an EGFR of 33 which places very close to her prior baseline.  We do not plan for any additional immunosuppression at this time.  No indication for dialysis.  Continue supportive care and avoid nephrotoxins as possible.  2.  Anemia of chronic kidney disease.  Hemoglobin low at 7.1.  She also has leukopenia and thrombocytopenia.  Defer decision regarding Epogen to hematology.   LOS: 7 Ira Dougher 11/7/202210:03 AM

## 2021-01-16 NOTE — Progress Notes (Signed)
Chaplain Maggie offered support to pt's spouse and friends in the ICU waiting room. He is surrounded by his faith community as he struggles with his wife's decline. His pastor was present as well as other friends. Chaplain offered hospitality and ministry of presence. Continued support available per on call chaplain.

## 2021-01-16 NOTE — Progress Notes (Signed)
NAME:  Lauren Bradshaw, MRN:  [id number], DOB:  Dec 17, 1950, LOS: 0 ADMISSION DATE:  12/12/2020 CONSULTATION DATE:  12/10/2020             REFERRING MD:  Dr. Kerman Passey CHIEF COMPLAINT:  Weakness, lethargy and abdominal pain     BRIEF SYNOPSIS:  Lauren Bradshaw is a 70 y/o F who presented to [facility] ED for weakness, lethargy, diarrhea, and abdominal pain after recent d/c from rehab for c.diff, Afib, PNA, and ANCA renal vasculitis. Patient placed in ICU care for hemodynamic instability secondary to hypovolemic septic shock d/t pancolitis with multiorgan failure with resp distress, renal failure, cardiac failure with afib, severe leukopenia with severe metabolic acidosis and encephalopathy.   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 Diff. (She was meant to FU with both renal and pulmonary specialists this week to determine further plan of care.)    Pertinent  Medical History  Atrial fibrillation  C. Diff HTN HDL ANCA renal vasculitis  PNA w/ respiratory failure  Hypothyroidism    Significant Hospital Events: Including procedures, antibiotic start and stop dates in addition to other pertinent events   ED course: Code stroke activated, CTH obtained, but further testing deferred d/t hemodynamic instability. Patient was given amiodarone bolus for AFIB w/ RVR.    10/31>> The critical care team was consulted for hemodynamic instability. Patient was seen in ED bed, appears to be critically ill - complaining of abdominal pain, CT abdomen/pelvis ordered. Will be transferred to critical care for further workup and treatment.  11/1>> CT abdomen/pelvis confirms pancolitis. Surgery on board for possible colectomy  with end iliostomy if worsening condition or increasing lactate. Continue treatment for sepsis and supportive care.  11/2>> Continue to trend lactate. Continue antibiotic treatment, pressure support as needed. No fever overnight. Increasing WBCs and neutrophils. Kidney function stable.  11/3>> Patient feeling better. Abdominal tenderness improved. Kidney function slightly worse. Continue abx treatment to avoid surgery. Increasing WBC count.  11/4>> Developed SOB and hypoxia overnight requiring 15L Hi-Flo O2. Suspected pulmonary edema d/t fluid overload in setting of impaired kidney function. Diuretics given.  CXR shows progressive worsening of b/l infiltrates(11/4) 11/4>> US guided thoracentesis-535ml's removed, tried esmolol, tried digoxin, HR 130's 11/5>> remains on in afib, resp distress, hypoxia, nephrology consulted 11/6>> remains hypoxic, digoxin toxicity noted +renal failure 11/7>> patient made DNR and transitioned to [place] per patient wishes   Micro Data:  10/31>> Blood culture pending (no growth 3 days) 10/31>> Stool PCR - positive for C.Diff    Antimicrobials:  10/31>> metronidazole, vancomycin, cefepime (1x dose)  11/1>> metronidazole, PO vancomycin    Interim History / Subjective:  Today patient refused AM medications, blood draws, etc. She has made her wishes very clear (off precedex and other sedating medications), that she no longer wants medical intervention and would like to be made full comfort care and transferred to hospice when able.  Palliative was consulted, patient code status changed to DNR and full comfort care was initiated.  Husband, daughter-in-law, and son are at bedside and are agreeable to the plan.   Objective   Blood pressure (!) 106/57, pulse (!) 124, temperature (!) 97.5 F (36.4 C), temperature source Oral, resp. rate (!) 32, height 5\' 4"  (  1.626 m), weight 54.7 kg, SpO2 97 %.    Intake/Output Summary (Last 24 hours) at 01/20/2021 1642 Last data filed at  20-Jan-2021 1639    Gross per 24 hour  Intake 2500.67 ml  Output --  Net 2500.67 ml       Filed Weights    01-20-21 1013  Weight: 54.7 kg      REVIEW OF SYSTEMS *Patient refused to answer specific questions stating "she is tired and she wants to be done".   PHYSICAL EXAMINATION:  GENERAL: Patient appears exhausted on high-flo O2. Tearful and refusing all care and examination. The rest of the physical exam was not obtained due to refusal.     Labs/imaging that I havepersonally reviewed  (right click and "Reselect all SmartList Selections" daily)  10/31>> CTH- There is no acute intracranial hemorrhage or evidence of acute infarction. ASPECT score is 10.  10/31 >> CXR- New airspace opacity in the right suprahilar lung likely due to pneumonia. Radiographic follow-up to resolution is recommended to exclude developing mass. 10/31 >> CT abdomen/pelvis - pancolitis. Left renal subscapular fluid collection, likely hematoma d/t recent renal biopsy.  11/1>> CXR- persistent medial right upper lobe airspace consolidation concerning for pneumonia. Followup PA and lateral chest X-ray is recommended in 3-4 weeks following trial of antibiotic therapy to ensure resolution and exclude underlying malignancy. 11/1>> [place] BLE- No evidence of DVT within either lower extremity. 11/1>> Echo- EF 60-65%, mild LAE (overall normal heart function) 11/2>> KUB - nonobstructive bowel gas pattern. No free intraperitoneal air seen.  11/2>> MR Brain- tiny acute infarct in right frontal lobe. Moderate chronic microvascular ischemic disease.  11/2>> MR T-spine- unremarkable. Mod-sized bilateral pleural effusions.  11/4>> CXR- The appearance the chest is concerning for severely worsening multilobar bilateral pneumonia. Probable trace bilateral pleural effusions. 11/5 CT chest/ABD/PELVIS-b/l infiltrates 11/7 CXR> Unchanged extent multifocal bilateral pulmonary opacities. May represent multifocal pneumonia or ARDS. Pleural  effusions, not significantly changed.    Resolved Hospital Problem list       ASSESSMENT AND PLAN   SYNOPSIS: Lauren Bradshaw is a 70 y/o F who presented to [facility] ED for weakness, lethargy, diarrhea, and abdominal pain after recent d/c from rehab for c.diff, Afib, PNA, and ANCA renal vasculitis. Patient placed in ICU care for hemodynamic instability secondary to hypovolemic septic shock d/t pancolitis with multiorgan failure with resp distress, renal failure, cardiac failure with afib, hypoglycemia, severe leukopenia with severe metabolic acidosis and encephalopathy.   11/7: GOALS OF CARE DISCUSSION  Today patient refused AM medications, blood draws, etc. She has made her wishes very clear (off precedex and other sedating medications), that she no longer wants medical intervention and would like to be made full comfort care and transferred to hospice when able.  Palliative was consulted, patient code status changed to DNR and full comfort care was initiated.  Husband, daughter-in-law, and son are at bedside and are agreeable to the plan.    Best practice (right click and "Reselect all SmartList Selections" daily)  Diet: Clear liquids Pain/Anxiety/Delirium protocol (if indicated): Comfort Care VAP protocol (if indicated): Not indicated Glucose control:  SSI No Central venous access:  N/A Arterial line:  N/A Foley:  N/A Mobility:  as tol Code Status:  DNR Disposition: Comfort Care    Sam Guinevere Scarlet PA-S [place] [facility]

## 2021-01-17 DIAGNOSIS — R531 Weakness: Secondary | ICD-10-CM | POA: Diagnosis not present

## 2021-01-17 DIAGNOSIS — I4891 Unspecified atrial fibrillation: Secondary | ICD-10-CM | POA: Diagnosis not present

## 2021-01-17 DIAGNOSIS — I7782 Antineutrophilic cytoplasmic antibody (ANCA) vasculitis: Secondary | ICD-10-CM | POA: Diagnosis not present

## 2021-01-17 DIAGNOSIS — I959 Hypotension, unspecified: Secondary | ICD-10-CM | POA: Diagnosis not present

## 2021-01-17 DIAGNOSIS — J90 Pleural effusion, not elsewhere classified: Secondary | ICD-10-CM

## 2021-01-17 DIAGNOSIS — R197 Diarrhea, unspecified: Secondary | ICD-10-CM | POA: Diagnosis not present

## 2021-01-17 DIAGNOSIS — Z9889 Other specified postprocedural states: Secondary | ICD-10-CM

## 2021-01-17 DIAGNOSIS — K529 Noninfective gastroenteritis and colitis, unspecified: Secondary | ICD-10-CM | POA: Diagnosis not present

## 2021-01-17 DIAGNOSIS — N189 Chronic kidney disease, unspecified: Secondary | ICD-10-CM | POA: Diagnosis present

## 2021-01-17 DIAGNOSIS — N08 Glomerular disorders in diseases classified elsewhere: Secondary | ICD-10-CM | POA: Diagnosis present

## 2021-01-17 DIAGNOSIS — R571 Hypovolemic shock: Secondary | ICD-10-CM | POA: Diagnosis not present

## 2021-01-17 DIAGNOSIS — T460X1A Poisoning by cardiac-stimulant glycosides and drugs of similar action, accidental (unintentional), initial encounter: Secondary | ICD-10-CM | POA: Diagnosis not present

## 2021-01-17 LAB — CYTOLOGY - NON PAP

## 2021-01-17 LAB — HEPARIN INDUCED PLATELET AB (HIT ANTIBODY): Heparin Induced Plt Ab: 0.062 OD (ref 0.000–0.400)

## 2021-01-17 LAB — BODY FLUID CULTURE W GRAM STAIN
Culture: NO GROWTH
Gram Stain: NONE SEEN

## 2021-01-17 LAB — FUNGITELL, SERUM: Fungitell Result: 55 pg/mL (ref <80)

## 2021-01-17 MED ORDER — MORPHINE 100MG IN NS 100ML (1MG/ML) PREMIX INFUSION
2.0000 mg/h | INTRAVENOUS | Status: DC
  Administered 2021-01-17: 2 mg/h via INTRAVENOUS
  Filled 2021-01-17: qty 100

## 2021-01-17 MED ORDER — LORAZEPAM 2 MG/ML IJ SOLN
1.0000 mg | INTRAMUSCULAR | Status: DC | PRN
  Administered 2021-01-17: 1 mg via INTRAVENOUS
  Filled 2021-01-17: qty 1

## 2021-01-18 LAB — ADAMTS13 ACTIVITY REFLEX

## 2021-01-18 LAB — ADAMTS13 ACTIVITY: Adamts 13 Activity: 63.5 % — ABNORMAL LOW (ref >66.8)

## 2021-02-09 NOTE — Progress Notes (Signed)
Pulmonary edema, chronicity unknown.  Likely due to volume overload with renal insufficiency and atrial fibrillation as precipitating factor.

## 2021-02-09 NOTE — Progress Notes (Signed)
Metabolic encephalopathy as noted on the note "metabolic acidosis and encephalopathy", both are metabolic.

## 2021-02-09 NOTE — Progress Notes (Signed)
Lauren Bradshaw offered support to family in ICU waiting room. Social support was offered to family and friends gathered. Pt's grand daughter received a prayer blanket. Chaplain expects to follow up and continued support available by request per on call chaplain.

## 2021-02-09 NOTE — Death Summary Note (Signed)
DEATH SUMMARY   Patient Details  Name: Lauren Bradshaw MRN: [id number] DOB: 1950-09-07  Admission/Discharge Information   Admit Date:  January 27, 2021  Date of Death: 02/04/2021  Time of Death: 06-Jul-1429  Length of Stay: 8  Referring Physician: Kateri Mc, MD   Reason(s) for Hospitalization  Hypovolemic/septic shock due to pancolitis from C. difficile  Diagnoses  Preliminary cause of death:  Severe sepsis due to pancolitis from C. 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 [name] is a 70 year old female with a history of atrial fibrillation, c. Diff., HTN, HDL, and vasculitis who presents to [facility] ED with complaints of progressive weakness and lethargy. She was recently discharged from [facility] rehab after a long stint of being in and out of hospitals since August 1st for treatment for PNA with respiratory failure (did not require intubation), AFIB (currently not on Eliquis d/t decreased hemoglobin), ANCA renal vasculitis (status post rituximab, had seizure), and C. Diff. (She was meant to FU with both renal and pulmonary specialists this week to determine further plan of care.)   She was discharged to her son [name]) and daughter-in-law's [name]) home able to pivot transfer and was doing quite well. She was reported to have formed stools upon discharge. However, over the weekend, she developed severe diarrhea, abdominal pain, and progressive weakness and lethargy. She had decreased oral  intake of both food and water. Patient was unable to get out of bed, EMS was called. She was transferred to [facility] and not back to [facility] d/t positive stroke screen for slurred speech. Code stroke was activated.  A stroke was ruled out.  She was then evaluated at the [facility] ED for weakness, lethargy, diarrhea, and abdominal pain after recent d/c from rehab for c.diff, Afib, PNA, and ANCA renal vasculitis. Patient placed in ICU care for hemodynamic instability secondary to hypovolemic septic shock d/t pancolitis from C. difficile with multiorgan failure with resp distress, renal failure, cardiac failure with afib, severe leukopenia with severe metabolic acidosis and encephalopathy.  Over the course of the hospital stay she developed A. fib with RVR and received digoxin.  However because of impaired renal function the patient developed digoxin toxicity.  Hospital course as noted below: 10/31>> The critical care team was consulted for hemodynamic instability. Patient was seen in ED bed, appears to be critically ill - complaining of abdominal pain, CT abdomen/pelvis ordered. Will be transferred to critical care for further workup and treatment.  11/1>> CT abdomen/pelvis confirms pancolitis. Surgery on board for possible colectomy with end iliostomy if worsening condition or increasing lactate. Continue treatment for sepsis and supportive care.  11/2>> Continue to trend lactate. Continue antibiotic treatment, pressure support as needed. No fever overnight. Increasing WBCs and neutrophils. Kidney function stable.  11/3>> Patient feeling better. Abdominal tenderness improved. Kidney function slightly worse. Continue abx treatment to avoid surgery. Increasing WBC count.  11/4>> Developed SOB and hypoxia overnight requiring 15L Hi-Flo O2. Suspected pulmonary edema d/t fluid overload in setting of impaired kidney function. Diuretics given.  CXR shows progressive worsening  of b/l infiltrates(11/4) 11/4>> US guided  thoracentesis-532ml's removed, tried esmolol, tried digoxin, HR 130's 11/5>> remains on in afib, resp distress, hypoxia, nephrology consulted 11/6>> remains hypoxic, digoxin toxicity noted +renal failure 11/7>> patient made DNR and transitioned to [place] per patient wishes  11/8>> discomfort, initiated morphine infusion  There was consideration of possible rituximab toxicity and IVIG infusion was suggested however the patient declined this.  She had been noted to have been in decline for 3 months prior to admission.  Family was supportive of the patient's decision.  Palliative care saw the patient and transitioned her to comfort measures.  Because of persistent terminal discomfort and delirium morphine infusion was initiated which achieved the goal of comfort for the patient.  The patient expired on 2021/02/15 at 1431 hrs. with family in attendance.  Condolences were conveyed to the family.  Pertinent Labs and Studies  Significant Diagnostic Studies CT ABDOMEN PELVIS WO CONTRAST  Addendum Date: 12/24/2020   ADDENDUM REPORT: 01/08/2021 16:17 ADDENDUM: Additional clinical history has been provided. The patient recently underwent a random left renal biopsy a few weeks ago that a required embolization. As such, the left renal subcapsular fluid collection likely represents old hematoma and the previously described 4 mm left renal calculus is likely a vascular coil. Electronically Signed   By: Titus Dubin M.D.   On: 12/22/2020 16:17   Result Date: 01/04/2021 CLINICAL DATA:  Diarrhea and weakness. EXAM: CT ABDOMEN AND PELVIS WITHOUT CONTRAST TECHNIQUE: Multidetector CT imaging of the abdomen and pelvis was performed following the standard protocol without IV contrast. COMPARISON:  None. FINDINGS: Lower chest: Small bilateral pleural effusions. Trace pericardial effusion. Mild bibasilar atelectasis/scarring. Hepatobiliary: Small calcification in the right hepatic lobe. No other focal liver abnormality.  Mildly distended gallbladder without wall thickening or radiopaque gallstones. No biliary dilatation. Pancreas: Unremarkable. No pancreatic ductal dilatation or surrounding inflammatory changes. Spleen: Normal in size without focal abnormality. Adrenals/Urinary Tract: The adrenal glands and right kidney are unremarkable. 3.9 x 5.5 x 8.7 cm low-density left renal subcapsular fluid collection (series 2, image 36; series 6, image 83). 4 mm calculus in the left kidney. No hydronephrosis. The bladder is unremarkable. Stomach/Bowel: Moderate hiatal hernia. The stomach is otherwise within normal limits. Severe circumferential wall thickening of the entire colon. No pneumatosis. The small bowel is unremarkable. Normal appendix. Vascular/Lymphatic: Aortic atherosclerosis. No enlarged abdominal or pelvic lymph nodes. Reproductive: Prior hysterectomy. 3.9 cm simple appearing cyst in the right ovary. Other: Trace ascites around the liver, in both paracolic gutters, and in the pelvis. Prominent presacral soft tissue stranding. No pneumoperitoneum. Musculoskeletal: No acute or significant osseous findings. IMPRESSION: 1. Severe circumferential wall thickening of the entire colon, consistent with pancolitis. 2. 8.7 cm low-density left renal subcapsular fluid collection, concerning for abscess. Correlate with urinalysis. 3. Trace ascites.  Small bilateral pleural effusions. 4. 3.9 cm simple appearing right ovarian cyst. Recommend outpatient follow-up with pelvic US. Reference: JACR 2020 Feb;17(2):248-254 5. Aortic Atherosclerosis (ICD10-I70.0). Electronically Signed: By: Titus Dubin M.D. On: 12/28/2020 16:01   DG Chest 1 View  Result Date: 01/13/2021 CLINICAL DATA:  70 year old female with history of shortness of breath. EXAM: CHEST  1 VIEW COMPARISON:  Chest x-ray 01/10/2021. FINDINGS: Lung volumes are low. Worsening patchy multifocal interstitial and airspace disease asymmetrically distributed throughout the lungs  bilaterally, most confluent throughout the right mid to lower lung. Mild blunting of the costophrenic sulci bilaterally which may suggest trace bilateral pleural effusions. No pneumothorax. Pulmonary vasculature is largely obscured, but  does not appearing origin. Heart size is normal. Upper mediastinal contours are within normal limits. Atherosclerotic calcifications in the thoracic aorta. IMPRESSION: 1. The appearance the chest is concerning for severely worsening multilobar bilateral pneumonia. Probable trace bilateral pleural effusions. 2. Aortic atherosclerosis. Electronically Signed   By: Vinnie Langton M.D.   On: 01/13/2021 06:26   DG Chest 1 View  Result Date: 01/10/2021 CLINICAL DATA:  70 year old female with history of shortness of breath. EXAM: CHEST  1 VIEW COMPARISON:  Chest x-ray 12/11/2020. FINDINGS: Lung volumes are low. Persistent ill-defined opacity in the medial aspect of the right upper lobe, unchanged. Left lung appears clear. Known small bilateral pleural effusions are not readily apparent on this single AP view. No pneumothorax. No evidence of pulmonary edema. Heart size is borderline enlarged. Atherosclerotic calcifications in the thoracic aorta. IMPRESSION: 1. Persistent medial right upper lobe airspace consolidation concerning for pneumonia. Followup PA and lateral chest X-ray is recommended in 3-4 weeks following trial of antibiotic therapy to ensure resolution and exclude underlying malignancy. 2. Aortic atherosclerosis. 3. Small bilateral pleural effusions noted on yesterday's CT the abdomen and pelvis are not readily apparent on today's plain film examination. Electronically Signed   By: Vinnie Langton M.D.   On: 01/10/2021 05:31   DG Abd 1 View  Result Date: 01/13/2021 CLINICAL DATA:  Weakness. EXAM: ABDOMEN - 1 VIEW COMPARISON:  January 11, 2021. FINDINGS: The bowel gas pattern is normal. Phleboliths are noted in the pelvis. Stable position of left femoral catheter.  IMPRESSION: No abnormal bowel dilatation. Electronically Signed   By: Marijo Conception M.D.   On: 01/13/2021 15:51   MR BRAIN WO CONTRAST  Result Date: 01/11/2021 CLINICAL DATA:  Neuro deficit, acute, stroke suspected EXAM: MRI HEAD WITHOUT CONTRAST TECHNIQUE: Multiplanar, multiecho pulse sequences of the brain and surrounding structures were obtained without intravenous contrast. COMPARISON:  January 09, 2021. FINDINGS: Brain: Punctate focus of restricted diffusion in the right frontal white matter (series 5/6, image 36), suspicious for tiny acute infarct. No significant edema or mass effect. Moderate scattered T2 hyperintensities in the white matter, nonspecific but compatible with chronic microvascular disease. No evidence of acute hemorrhage, hydrocephalus, mass lesion, midline shift, or extra-axial fluid collection. Vascular: Major arterial flow voids are maintained at the skull base. Skull and upper cervical spine: Normal marrow signal. Sinuses/Orbits: Clear sinuses.  Unremarkable orbits. Other: Small bilateral mastoid effusions. IMPRESSION: 1. Punctate focus of restricted diffusion in the right frontal white matter, suspicious for tiny acute infarct. No significant edema or mass effect. 2. Moderate chronic microvascular ischemic disease. Electronically Signed   By: Margaretha Sheffield M.D.   On: 01/11/2021 14:26   MR THORACIC SPINE WO CONTRAST  Result Date: 01/11/2021 CLINICAL DATA:  Right-sided weakness and lethargy. Possible myelopathy. EXAM: MRI THORACIC SPINE WITHOUT CONTRAST TECHNIQUE: Multiplanar, multisequence MR imaging of the thoracic spine was performed. No intravenous contrast was administered. COMPARISON:  None. FINDINGS: Alignment:  Normal Vertebrae: Normal marrow signal.  No bone lesions or fractures. Cord:  Normal cord signal intensity.  No cord lesions or syrinx. Paraspinal and other soft tissues: No significant paraspinal findings. There are moderate-sized bilateral pleural effusions  noted. Disc levels: No significant disc protrusions, spinal or foraminal stenosis. IMPRESSION: 1. Unremarkable thoracic spine MRI examination. 2. Moderate-sized bilateral pleural effusions. Electronically Signed   By: Marijo Sanes M.D.   On: 01/11/2021 14:31   US Venous Img Lower Bilateral (DVT)  Result Date: 01/10/2021 CLINICAL DATA:  Bilateral lower extremity pain and edema.  Evaluate for DVT. EXAM: BILATERAL LOWER EXTREMITY VENOUS DOPPLER ULTRASOUND TECHNIQUE: Gray-scale sonography with graded compression, as well as color Doppler and duplex ultrasound were performed to evaluate the lower extremity deep venous systems from the level of the common femoral vein and including the common femoral, femoral, profunda femoral, popliteal and calf veins including the posterior tibial, peroneal and gastrocnemius veins when visible. The superficial great saphenous vein was also interrogated. Spectral Doppler was utilized to evaluate flow at rest and with distal augmentation maneuvers in the common femoral, femoral and popliteal veins. COMPARISON:  None. FINDINGS: RIGHT LOWER EXTREMITY Common Femoral Vein: No evidence of thrombus. Normal compressibility, respiratory phasicity and response to augmentation. Saphenofemoral Junction: No evidence of thrombus. Normal compressibility and flow on color Doppler imaging. Profunda Femoral Vein: No evidence of thrombus. Normal compressibility and flow on color Doppler imaging. Femoral Vein: No evidence of thrombus. Normal compressibility, respiratory phasicity and response to augmentation. Popliteal Vein: No evidence of thrombus. Normal compressibility, respiratory phasicity and response to augmentation. Calf Veins: No evidence of thrombus. Normal compressibility and flow on color Doppler imaging. Superficial Great Saphenous Vein: No evidence of thrombus. Normal compressibility. Venous Reflux:  None. Other Findings:  None. LEFT LOWER EXTREMITY Common Femoral Vein: Not visualized  secondary to bandage associated with left femoral approach central venous catheter. Saphenofemoral Junction: Not visualized secondary to bandages so shaded with left femoral approach central venous catheter. Profunda Femoral Vein: No evidence of thrombus. Normal compressibility and flow on color Doppler imaging. Femoral Vein: No evidence of thrombus. Normal compressibility, respiratory phasicity and response to augmentation. Popliteal Vein: No evidence of thrombus. Normal compressibility, respiratory phasicity and response to augmentation. Calf Veins: No evidence of thrombus. Normal compressibility and flow on color Doppler imaging. Superficial Great Saphenous Vein: No evidence of thrombus. Normal compressibility. Venous Reflux:  None. Other Findings:  None. IMPRESSION: No evidence of DVT within either lower extremity. Electronically Signed   By: Sandi Mariscal M.D.   On: 01/10/2021 08:12   DG Chest Port 1 View  Result Date: 01/15/2021 CLINICAL DATA:  Pneumonia. EXAM: PORTABLE CHEST 1 VIEW COMPARISON:  Radiograph earlier today.  Chest CT yesterday FINDINGS: Stable heart size and mediastinal contours. Unchanged extent multifocal bilateral pulmonary opacities. Left pleural effusion is stable. A right pleural effusion was better demonstrated on prior CT. No pneumothorax. IMPRESSION: 1. Unchanged extent multifocal bilateral pulmonary opacities. This may represent multifocal pneumonia or ARDS. Stable radiographic appearance from earlier today. 2. Pleural effusions, not significantly changed. The right pleural effusion was better appreciated on CT. Electronically Signed   By: Keith Rake M.D.   On: 01/15/2021 18:13   DG Chest Port 1 View  Result Date: 01/15/2021 CLINICAL DATA:  Hypoxia EXAM: PORTABLE CHEST 1 VIEW COMPARISON:  Chest radiograph from one day prior. FINDINGS: Stable cardiomediastinal silhouette with top-normal heart size. No pneumothorax. Small left pleural effusion, slightly increased. No  significant right pleural effusion. Extensive patchy opacities throughout both lungs, slightly worsened. IMPRESSION: 1. Extensive patchy opacities throughout both lungs, slightly worsened, compatible with multifocal pneumonia or ARDS. 2. Small left pleural effusion, slightly increased. Electronically Signed   By: Ilona Sorrel M.D.   On: 01/15/2021 07:09   DG Chest Port 1 View  Result Date: 01/14/2021 CLINICAL DATA:  Worsening hypoxia.  Pancolitis. EXAM: PORTABLE CHEST 1 VIEW COMPARISON:  01/13/2021 FINDINGS: Heart size remains within normal limits. Aortic atherosclerotic calcification noted. Increased heterogeneous airspace disease is seen bilaterally. Multiple ill-defined nodular opacities are also noted, which appear new. This raises possibility  of septic emboli. No evidence of pleural effusion. IMPRESSION: Increased bilateral heterogeneous airspace disease with ill-defined nodular opacities. This raises possibility of septic emboli. Consider chest CTA for further evaluation. These results will be called to the ordering clinician or representative by the Radiologist Assistant, and communication documented in the PACS or [employer]. Electronically Signed   By: Marlaine Hind M.D.   On: 01/14/2021 10:39   DG Chest Port 1 View  Result Date: 01/13/2021 CLINICAL DATA:  Pleural effusion.  Status post thoracentesis. EXAM: PORTABLE CHEST 1 VIEW COMPARISON:  01/13/2021 at 5:57 a.m. FINDINGS: The cardiomediastinal silhouette is unchanged with normal heart size. The lungs remain hypoinflated with interstitial and patchy airspace opacities bilaterally. Lung aeration as overall mildly improved, most notably in the right mid to lower lung. There may be trace bilateral pleural effusions. No pneumothorax is identified. IMPRESSION: Bilateral airspace disease with mildly improved aeration on the right. No pneumothorax. Electronically Signed   By: Logan Bores M.D.   On: 01/13/2021 14:33   DG Chest Port 1  View  Result Date: 12/21/2020 CLINICAL DATA:  Possible sepsis Right-sided weakness and lethargic EXAM: PORTABLE CHEST 1 VIEW COMPARISON:  01/07/2019 FINDINGS: Heart size within normal limits. Mild pulmonary vascular congestion. There is new airspace opacity in the right suprahilar lung. IMPRESSION: New airspace opacity in the right suprahilar lung likely due to pneumonia. Radiographic follow-up to resolution is recommended to exclude developing mass. Electronically Signed   By: Miachel Roux M.D.   On: 01/08/2021 14:08   DG Abd Portable 1V  Result Date: 01/11/2021 CLINICAL DATA:  Colitis, weakness. EXAM: PORTABLE ABDOMEN - 1 VIEW COMPARISON:  CT abdomen pelvis 12/14/2020 FINDINGS: Scattered air is seen in the small bowel and colon with a nonobstructive bowel gas pattern. No free intraperitoneal air given the limits of the supine images. Left femoral central venous catheter tip projects at the level of the left common iliac vein. Left renal embolization coil. A 1 cm nodular opacity overlying the right lung base is favored to represent a nipple shadow. IMPRESSION: 1. Nonobstructive bowel gas pattern. 2. Interval placement of left femoral central venous catheter with tip overlying the expected location of the left common iliac vein. Electronically Signed   By: Ileana Roup M.D.   On: 01/11/2021 08:53   DG Abd Portable 2V  Result Date: 01/14/2021 CLINICAL DATA:  70 year old female with colitis EXAM: PORTABLE ABDOMEN - 2 VIEW COMPARISON:  01/13/2021, abdominal CT 12/16/2020 FINDINGS: Overlying EKG leads somewhat limit the evaluation. Relative paucity of bowel gas, with paucity of gas in the stomach small bowel and colon. Trace gas present within left and right colon. No abnormal distension or air-fluid level. No unexpected radiopaque foreign body. Vascular calcifications present. Metallic coils project over the left renal silhouette similar to the prior CT. Left femoral vascular line. Degenerative changes of  the spine, hips, bilateral sacroiliac joints. No acute displaced fracture. IMPRESSION: Nonspecific bowel gas pattern, with no definite evidence of obstruction. Left femoral vascular line Electronically Signed   By: Corrie Mckusick D.O.   On: 01/14/2021 10:48   ECHOCARDIOGRAM COMPLETE  Result Date: 01/10/2021    ECHOCARDIOGRAM REPORT   Patient Name:   LILIE DEKKER Date of Exam: 01/10/2021 Medical Rec #:  [id number]  Height:       64.0 in Accession #:    [id number] Weight:       120.6 lb Date of Birth:  March 02, 1951   BSA:          1.578  m Patient Age:    26 years   BP:           94/58 mmHg Patient Gender: F          HR:           132 bpm. Exam Location:  [facility] Procedure: 2D Echo, Cardiac Doppler and Color Doppler Indications:     Atrial Fibrillation I48.91  History:         Patient has no prior history of Echocardiogram examinations.                  Arrythmias:Atrial Fibrillation; Risk Factors:Hypertension and                  Dyslipidemia.  Sonographer:     Sherrie Sport Referring Phys:  [id number] Kathrin Ruddy ORGEL Diagnosing Phys: Donnelly Angelica  Sonographer Comments: Suboptimal apical window. IMPRESSIONS  1. Left ventricular ejection fraction, by estimation, is 60 to 65%. The left ventricle has normal function. The left ventricle has no regional wall motion abnormalities. Left ventricular diastolic parameters are indeterminate.  2. Right ventricular systolic function was not well visualized. The right ventricular size is not well visualized.  3. Left atrial size was mildly dilated.  4. The mitral valve is normal in structure. No evidence of mitral valve regurgitation. No evidence of mitral stenosis.  5. The aortic valve was not well visualized. Aortic valve regurgitation is not visualized.  6. The inferior vena cava is normal in size with greater than 50% respiratory variability, suggesting right atrial pressure of 3 mmHg. FINDINGS  Left Ventricle: Left ventricular ejection fraction, by estimation, is 60 to 65%. The left  ventricle has normal function. The left ventricle has no regional wall motion abnormalities. The left ventricular internal cavity size was small. There is no left ventricular hypertrophy. Left ventricular diastolic parameters are indeterminate. Right Ventricle: The right ventricular size is not well visualized. Right vetricular wall thickness was not well visualized. Right ventricular systolic function was not well visualized. Left Atrium: Left atrial size was mildly dilated. Right Atrium: Right atrial size was not well visualized. Pericardium: There is no evidence of pericardial effusion. Mitral Valve: The mitral valve is normal in structure. No evidence of mitral valve regurgitation. No evidence of mitral valve stenosis. MV peak gradient, 7.0 mmHg. The mean mitral valve gradient is 3.0 mmHg. Tricuspid Valve: The tricuspid valve is not well visualized. Tricuspid valve regurgitation is not demonstrated. Aortic Valve: The aortic valve was not well visualized. Aortic valve regurgitation is not visualized. Aortic valve mean gradient measures 3.0 mmHg. Aortic valve peak gradient measures 6.9 mmHg. Aortic valve area, by VTI measures 2.54 cm. Pulmonic Valve: The pulmonic valve was not well visualized. Pulmonic valve regurgitation is not visualized. No evidence of pulmonic stenosis. Aorta: The aortic root was not well visualized. Venous: The inferior vena cava is normal in size with greater than 50% respiratory variability, suggesting right atrial pressure of 3 mmHg. IAS/Shunts: The interatrial septum was not well visualized.  LEFT VENTRICLE PLAX 2D LVIDd:         3.97 cm   Diastology LVIDs:         2.89 cm   LV e' medial:    9.68 cm/s LV PW:         1.23 cm   LV E/e' medial:  9.9 LV IVS:        0.85 cm   LV e' lateral:   10.80 cm/s LVOT diam:     2.00  cm   LV E/e' lateral: 8.8 LV SV:         38 LV SV Index:   24 LVOT Area:     3.14 cm  RIGHT VENTRICLE RV Basal diam:  3.53 cm RV S prime:     14.90 cm/s TAPSE (M-mode):  3.8 cm LEFT ATRIUM             Index        RIGHT ATRIUM           Index LA diam:        4.50 cm 2.85 cm/m   RA Area:     17.00 cm LA Vol (A2C):   44.1 ml 27.95 ml/m  RA Volume:   49.00 ml  31.05 ml/m LA Vol (A4C):   72.0 ml 45.63 ml/m LA Biplane Vol: 59.0 ml 37.39 ml/m  AORTIC VALVE                    PULMONIC VALVE AV Area (Vmax):    2.45 cm     PV Vmax:        1.04 m/s AV Area (Vmean):   2.76 cm     PV Vmean:       71.600 cm/s AV Area (VTI):     2.54 cm     PV VTI:         0.137 m AV Vmax:           131.00 cm/s  PV Peak grad:   4.3 mmHg AV Vmean:          79.800 cm/s  PV Mean grad:   2.0 mmHg AV VTI:            0.151 m      RVOT Peak grad: 4 mmHg AV Peak Grad:      6.9 mmHg AV Mean Grad:      3.0 mmHg LVOT Vmax:         102.00 cm/s LVOT Vmean:        70.000 cm/s LVOT VTI:          0.122 m LVOT/AV VTI ratio: 0.81  AORTA Ao Root diam: 2.80 cm MITRAL VALVE               TRICUSPID VALVE MV Area (PHT): 3.21 cm    TR Peak grad:   9.2 mmHg MV Area VTI:   2.25 cm    TR Vmax:        152.00 cm/s MV Peak grad:  7.0 mmHg MV Mean grad:  3.0 mmHg    SHUNTS MV Vmax:       1.32 m/s    Systemic VTI:  0.12 m MV Vmean:      81.1 cm/s   Systemic Diam: 2.00 cm MV Decel Time: 236 msec    Pulmonic VTI:  0.111 m MV E velocity: 95.50 cm/s MV A velocity: 83.10 cm/s MV E/A ratio:  1.15 Donnelly Angelica Electronically signed by Donnelly Angelica Signature Date/Time: 01/10/2021/1:17:36 PM    Final    CT HEAD CODE STROKE WO CONTRAST  Result Date: 12/16/2020 CLINICAL DATA:  Code stroke. EXAM: CT HEAD WITHOUT CONTRAST TECHNIQUE: Contiguous axial images were obtained from the base of the skull through the vertex without intravenous contrast. COMPARISON:  None. FINDINGS: Brain: There is no acute intracranial hemorrhage, mass effect, or edema. Gray-white differentiation is preserved. Ventricles and sulci are normal in size and configuration. Patchy low-density in the supratentorial white matter is nonspecific  but may reflect chronic  microvascular ischemic changes. No extra-axial collection. Vascular: No hyperdense vessel. There is intracranial atherosclerotic calcification at the skull base. Skull: Unremarkable. Sinuses/Orbits: No acute or significant abnormality. Other: Mastoid air cells are clear. ASPECTS ([place]berta Stroke Program Early CT Score) - Ganglionic level infarction (caudate, lentiform nuclei, internal capsule, insula, M1-M3 cortex): 7 - Supraganglionic infarction (M4-M6 cortex): 3 Total score (0-10 with 10 being normal): 10 IMPRESSION: There is no acute intracranial hemorrhage or evidence of acute infarction. ASPECT score is 10. Chronic microvascular ischemic changes. These results were called by telephone at the time of interpretation on 01-19-21 at 10:10 am to provider [facility] , who verbally acknowledged these results. Electronically Signed   By: Guadlupe Spanish M.D.   On: 01-19-21 10:11   CT CHEST ABDOMEN PELVIS WO CONTRAST  Result Date: 01/14/2021 CLINICAL DATA:  C difficile colitis and findings suspicious for pneumonia. EXAM: CT CHEST, ABDOMEN AND PELVIS WITHOUT CONTRAST TECHNIQUE: Multidetector CT imaging of the chest, abdomen and pelvis was performed following the standard protocol without IV contrast. COMPARISON:  Chest and abdominal film from earlier in the same day as well as CT from 2021-01-19 and 09/08/2020. FINDINGS: CT CHEST FINDINGS Cardiovascular: Somewhat limited due to lack of IV contrast. Aortic calcifications are noted without aneurysmal dilatation. No cardiac enlargement is seen. Coronary calcifications are noted. Minimal pericardial effusion is noted slightly increased from the prior exam. Mediastinum/Nodes: Thoracic inlet is within normal limits. No sizable hilar or mediastinal adenopathy is noted. The esophagus is well visualized and there is a moderate-sized sliding-type hiatal hernia identified. Lungs/Pleura: Lungs demonstrate patchy airspace opacity throughout both lungs with evidence of  bronchiectatic changes throughout both lungs. These have worsened significantly in the interval from the prior exam from June of 2022. Previously seen nodular changes are not well appreciated due to the overlying infiltrates. Effusions right considerably greater than left are seen as well. No pneumothorax is noted. Musculoskeletal: No acute rib abnormality is noted. Degenerative changes of the thoracic spine are noted. CT ABDOMEN PELVIS FINDINGS Hepatobiliary: Liver shows no discrete mass lesion. Vicarious excretion of contrast is noted within the gallbladder from prior exams. Mild perihepatic ascites is noted. Pancreas: Unremarkable. No pancreatic ductal dilatation or surrounding inflammatory changes. Spleen: Normal in size without focal abnormality. Adrenals/Urinary Tract: Adrenal glands are within normal limits. Right kidney shows no renal calculi or obstructive changes. Left kidney demonstrates a 4 mm lower pole stones stable from the prior exam. No obstructive changes are seen. There is a fluid collection in the noted along the posterior aspect of the left kidney measuring 5.1 cm in greatest dimension and predominately stable from the prior exam. This may simply represent a focal cyst although the possibility of sub capsular fluid collection deserves consideration although the overall appearance is stable. Bladder is decompressed by Foley catheter. Stomach/Bowel: Colon shows diffuse wall thickening consistent with the given clinical history of C difficile colitis. No obstructive changes are noted. No findings to suggest perforation are seen. Small bowel and stomach appear within normal limits aside from the previously mentioned hiatal hernia. Vascular/Lymphatic: Aortic atherosclerosis. No enlarged abdominal or pelvic lymph nodes. Right femoral venous line is noted. Reproductive: Status post hysterectomy. Right adnexal cyst is noted stable in appearance from the recent exam. Other: Ascites is noted throughout  the abdomen increased from the recent examination of 6 days previous. This may simply be reactive in nature. Musculoskeletal: No acute or significant osseous findings. IMPRESSION: CT of the chest: Bronchiectatic changes with diffuse  bilateral multifocal infiltrates consistent with multifocal pneumonia. Bilateral pleural effusions right greater than left are seen. CT of the abdomen and pelvis: Increase in ascites when compared with the recent exam although this may simply be reactive in nature given the changes in the colon. Diffuse wall thickening is noted throughout the colon consistent with the given clinical history of colitis. No perforation is identified. Hiatal hernia. Subcapsular fluid collection along the posterior aspect of the left kidney stable in appearance from the recent exam. Possibility of perinephric abscess could not be totally excluded. Stable 3.9 cm right adnexal cyst Recommend follow-up US in 6-12 months. Note: This recommendation does not apply to premenarchal patients and to those with increased risk (genetic, family history, elevated tumor markers or other high-risk factors) of ovarian cancer. Reference: JACR 2020 Feb; 17(2):248-254 Electronically Signed   By: Inez Catalina M.D.   On: 01/14/2021 20:44   US THORACENTESIS ASP PLEURAL SPACE W/IMG GUIDE  Result Date: 01/13/2021 INDICATION: 70 year old with pleural effusions. EXAM: ULTRASOUND GUIDED LEFT THORACENTESIS MEDICATIONS: None. COMPLICATIONS: None immediate. PROCEDURE: An ultrasound guided thoracentesis was thoroughly discussed with the patient and questions answered. The benefits, risks, alternatives and complications were also discussed. The patient understands and wishes to proceed with the procedure. Written consent was obtained. Ultrasound was performed to localize and mark an adequate pocket of fluid in the left chest. The area was then prepped and draped in the normal sterile fashion. 1% Lidocaine was used for local anesthesia.  Under ultrasound guidance a 6 Fr Safe-T-Centesis catheter was introduced. Thoracentesis was performed. The catheter was removed and a dressing applied. FINDINGS: Ultrasound performed prior to the procedure demonstrated bilateral pleural effusions. A total of approximately 450 mL of yellow fluid was removed. Samples were sent to the laboratory as requested by the clinical team. IMPRESSION: Successful ultrasound guided left thoracentesis yielding 450 mL of pleural fluid. Electronically Signed   By: Markus Daft M.D.   On: 01/13/2021 16:57    Microbiology Recent Results (from the past 240 hour(s))  Resp Panel by RT-PCR (Flu A&B, Covid) Nasopharyngeal Swab     Status: None   Collection Time: 12/12/2020 10:14 AM   Specimen: Nasopharyngeal Swab; Nasopharyngeal(NP) swabs in vial transport medium  Result Value Ref Range Status   SARS Coronavirus 2 by RT PCR NEGATIVE NEGATIVE Final    Comment: (NOTE) SARS-CoV-2 target nucleic acids are NOT DETECTED.  The SARS-CoV-2 RNA is generally detectable in upper respiratory specimens during the acute phase of infection. The lowest concentration of SARS-CoV-2 viral copies this assay can detect is 138 copies/mL. A negative result does not preclude SARS-Cov-2 infection and should not be used as the sole basis for treatment or other patient management decisions. A negative result may occur with  improper specimen collection/handling, submission of specimen other than nasopharyngeal swab, presence of viral mutation(s) within the areas targeted by this assay, and inadequate number of viral copies(<138 copies/mL). A negative result must be combined with clinical observations, patient history, and epidemiological information. The expected result is Negative.  Fact Sheet for Patients:  EntrepreneurPulse.com.au  Fact Sheet for Healthcare Providers:  IncredibleEmployment.be  This test is no t yet approved or cleared by the [place] FDA and  has been authorized for detection and/or diagnosis of SARS-CoV-2 by FDA under an Emergency Use Authorization (EUA). This EUA will remain  in effect (meaning this test can be used) for the duration of the COVID-19 declaration under Section 564(b)(1) of the Act, 21 U.S.C.section 360bbb-3(b)(1), unless the authorization  is terminated  or revoked sooner.       Influenza A by PCR NEGATIVE NEGATIVE Final   Influenza B by PCR NEGATIVE NEGATIVE Final    Comment: (NOTE) The Xpert Xpress SARS-CoV-2/FLU/RSV plus assay is intended as an aid in the diagnosis of influenza from Nasopharyngeal swab specimens and should not be used as a sole basis for treatment. Nasal washings and aspirates are unacceptable for Xpert Xpress SARS-CoV-2/FLU/RSV testing.  Fact Sheet for Patients: BloggerCourse.com  Fact Sheet for Healthcare Providers: SeriousBroker.it  This test is not yet approved or cleared by the [place] FDA and has been authorized for detection and/or diagnosis of SARS-CoV-2 by FDA under an Emergency Use Authorization (EUA). This EUA will remain in effect (meaning this test can be used) for the duration of the COVID-19 declaration under Section 564(b)(1) of the Act, 21 U.S.C. section 360bbb-3(b)(1), unless the authorization is terminated or revoked.  Performed at [facility], [address] [place], [place] [address]   C Difficile Quick Screen w PCR reflex     Status: Abnormal   Collection Time: 12/20/2020 10:14 AM   Specimen: STOOL  Result Value Ref Range Status   C Diff antigen POSITIVE (A) NEGATIVE Final   C Diff toxin POSITIVE (A) NEGATIVE Final   C Diff interpretation Toxin producing C. difficile detected.  Final    Comment: POSITIVE CRITICAL RESULT CALLED TO, READ BACK BY AND VERIFIED WITH: OLIVIA PRILLIEM 01/08/2021 1715 MU Performed at [facility], [address] [place], [place]  [address]   Gastrointestinal Panel by PCR , Stool     Status: None   Collection Time: 01/04/2021 10:14 AM   Specimen: STOOL  Result Value Ref Range Status   Campylobacter species NOT DETECTED NOT DETECTED Final   Plesimonas shigelloides NOT DETECTED NOT DETECTED Final   Salmonella species NOT DETECTED NOT DETECTED Final   Yersinia enterocolitica NOT DETECTED NOT DETECTED Final   Vibrio species NOT DETECTED NOT DETECTED Final   Vibrio cholerae NOT DETECTED NOT DETECTED Final   Enteroaggregative E coli (EAEC) NOT DETECTED NOT DETECTED Final   Enteropathogenic E coli (EPEC) NOT DETECTED NOT DETECTED Final   Enterotoxigenic E coli (ETEC) NOT DETECTED NOT DETECTED Final   Shiga like toxin producing E coli (STEC) NOT DETECTED NOT DETECTED Final   Shigella/Enteroinvasive E coli (EIEC) NOT DETECTED NOT DETECTED Final   Cryptosporidium NOT DETECTED NOT DETECTED Final   Cyclospora cayetanensis NOT DETECTED NOT DETECTED Final   Entamoeba histolytica NOT DETECTED NOT DETECTED Final   Giardia lamblia NOT DETECTED NOT DETECTED Final   Adenovirus F40/41 NOT DETECTED NOT DETECTED Final   Astrovirus NOT DETECTED NOT DETECTED Final   Norovirus GI/GII NOT DETECTED NOT DETECTED Final   Rotavirus A NOT DETECTED NOT DETECTED Final   Sapovirus (I, II, IV, and V) NOT DETECTED NOT DETECTED Final    Comment: Performed at [facility], [address] [place], [place] [address]  Blood Culture (routine x 2)     Status: None   Collection Time: 01/02/2021  1:49 PM   Specimen: BLOOD  Result Value Ref Range Status   Specimen Description BLOOD LEFT ANTECUBITAL  Final   Special Requests   Final    BOTTLES DRAWN AEROBIC ONLY Blood Culture results may not be optimal due to an inadequate volume of blood received in culture bottles   Culture   Final    NO GROWTH 5 DAYS Performed at [facility], [address] [place],  [place] [address]    Report Status 01/14/2021 FINAL  Final  Blood Culture (routine x  2)     Status: None   Collection Time: 12/30/2020  2:16 PM   Specimen: BLOOD  Result Value Ref Range Status   Specimen Description BLOOD BLOOD RIGHT ARM  Final   Special Requests   Final    BOTTLES DRAWN AEROBIC AND ANAEROBIC Blood Culture adequate volume   Culture   Final    NO GROWTH 5 DAYS Performed at [facility], [address], [place], [place] [address]    Report Status 01/14/2021 FINAL  Final  MRSA Next Gen by PCR, Nasal     Status: None   Collection Time: 01/03/2021  6:05 PM   Specimen: Nasal Mucosa; Nasal Swab  Result Value Ref Range Status   MRSA by PCR Next Gen NOT DETECTED NOT DETECTED Final    Comment: (NOTE) The GeneXpert MRSA Assay (FDA approved for NASAL specimens only), is one component of a comprehensive MRSA colonization surveillance program. It is not intended to diagnose MRSA infection nor to guide or monitor treatment for MRSA infections. Test performance is not FDA approved in patients less than 37 years old. Performed at [facility], [address], [place], [place] [address]   Urine Culture     Status: None   Collection Time: 01/10/21  8:07 AM   Specimen: In/Out Cath Urine  Result Value Ref Range Status   Specimen Description   Final    IN/OUT CATH URINE Performed at [facility], [address], [place], [place] [address]    Special Requests   Final    NONE Performed at [facility], [address], [place], [place] [address]    Culture   Final    NO GROWTH Performed at [facility], [place] [address] [place], [place] [address]    Report Status 01/11/2021 FINAL  Final  Body fluid culture w Gram Stain     Status: None   Collection Time: 01/13/21  2:45 PM   Specimen: PATH Cytology Pleural fluid  Result Value Ref Range Status   Specimen Description   Final    PLEURAL Performed at [facility], [address] [place], [place] [address]    Special Requests   Final    PLEURAL Performed at  [facility], [address] [place], [place] [address]    Gram Stain   Final    NO SQUAMOUS EPITHELIAL CELLS SEEN NO WBC SEEN NO ORGANISMS SEEN    Culture   Final    NO GROWTH 3 DAYS Performed at [facility], [place] [address], [place], [place] [address]    Report Status 01-21-2021 FINAL  Final    Lab Basic Metabolic Panel: Recent Labs  Lab 01/12/21 0330 01/13/21 0306 01/13/21 1453 01/14/21 0536 01/14/21 1836 01/15/21 0412 01/15/21 1453 01/16/21 0447  NA 128* 129*  --  131*  --  133*  --  135  K 3.7 3.3*   < > 3.6 3.3* 3.1*  3.1* 4.6 4.0  CL 93* 93*  --  97*  --  102  --  103  CO2 24 21*  --  22  --  23  --  24  GLUCOSE 112* 115*  --  78  --  150*  --  164*  BUN 30* 34*  --  38*  --  41*  --  42*  CREATININE 1.82* 1.88*  --  2.17*  --  2.01*  --  1.69*  CALCIUM 7.1* 7.7*  --  7.6*  --  7.4*  --  8.0*  MG 2.5* 2.5*  --  2.5*  --  2.2  --  2.2  PHOS 3.5 3.9  --  4.3  --  3.1  --  3.5   < > = values in this interval not displayed.   Liver Function Tests: Recent Labs  Lab 01/13/21 0306 01/14/21 0536 01/15/21 0412 01/16/21 0447  AST 11* 10* 17 10*  ALT 9 7 10 10   ALKPHOS 409* 459* 455* 353*  BILITOT 0.9 0.9 1.2 1.1  PROT 4.2* 4.1* 4.0* 4.3*  ALBUMIN 1.7* 1.6* 2.1* 2.6*   No results for input(s): LIPASE, AMYLASE in the last 168 hours. No results for input(s): AMMONIA in the last 168 hours. CBC: Recent Labs  Lab 01/11/21 0450 01/12/21 0330 01/13/21 0306 01/14/21 0536 01/15/21 0412 01/16/21 0447  WBC 2.5* 5.1 7.8 5.7 3.9* 2.6*  NEUTROABS 1.5*  --  5.9 4.2 2.8 1.9  HGB 9.1* 9.5* 9.9* 9.2* 9.0* 7.1*  HCT 26.2* 28.3* 29.1* 26.8* 27.5* 21.8*  MCV 82.6 83.5 82.2 82.2 84.4 85.2  PLT 156 123* 121* 94* 60* 42*   Cardiac Enzymes: No results for input(s): CKTOTAL, CKMB, CKMBINDEX, TROPONINI in the last 168 hours. Sepsis Labs: Recent Labs  Lab 01/11/21 0450 01/11/21 0930 01/11/21 1320 01/11/21 1649 01/12/21 0330 01/13/21 0306  01/13/21 1115 01/14/21 0536 01/15/21 0412 01/16/21 0447  PROCALCITON 45.10  --   --   --   --   --  19.72  --   --   --   WBC 2.5*  --   --   --    < > 7.8  --  5.7 3.9* 2.6*  LATICACIDVEN  --  2.1* 2.1* 1.7  --  1.2  --   --   --   --    < > = values in this interval not displayed.    Procedures/Operations  See documentation in chart.   Renold Don, MD Advanced Bronchoscopy [facility]  Jan 29, 2021, 3:02 PM

## 2021-02-09 NOTE — Progress Notes (Signed)
NAME:  Lauren Bradshaw, MRN:  [id number], DOB:  1951-01-14, LOS: 0 ADMISSION DATE:  12/13/2020 CONSULTATION DATE:  12/18/2020             REFERRING MD:  Dr. Kerman Passey CHIEF COMPLAINT:  Weakness, lethargy and abdominal pain     BRIEF SYNOPSIS:  Lauren Bradshaw is a 70 y/o F who presented to [facility] ED for weakness, lethargy, diarrhea, and abdominal pain after recent d/c from rehab for c.diff, Afib, PNA, and ANCA renal vasculitis. Patient placed in ICU care for hemodynamic instability secondary to hypovolemic septic shock d/t pancolitis with multiorgan failure with resp distress, renal failure, cardiac failure with afib, severe leukopenia with severe metabolic acidosis and encephalopathy.   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 Diff. (She was meant to FU with both renal and pulmonary specialists this week to determine further plan of care.)    Pertinent  Medical History  Atrial fibrillation  C. Diff HTN HDL ANCA renal vasculitis  PNA w/ respiratory failure  Hypothyroidism    Significant Hospital Events: Including procedures, antibiotic start and stop dates in addition to other pertinent events   ED course: Code stroke activated, CTH obtained, but further testing deferred d/t hemodynamic instability. Patient was given amiodarone bolus for AFIB w/ RVR.    10/31>> The critical care team was consulted for hemodynamic instability. Patient was seen in ED bed, appears to be critically ill - complaining of abdominal pain, CT abdomen/pelvis ordered. Will be transferred to critical care for further workup and treatment.  11/1>> CT abdomen/pelvis confirms pancolitis. Surgery on board for possible colectomy  with end iliostomy if worsening condition or increasing lactate. Continue treatment for sepsis and supportive care.  11/2>> Continue to trend lactate. Continue antibiotic treatment, pressure support as needed. No fever overnight. Increasing WBCs and neutrophils. Kidney function stable.  11/3>> Patient feeling better. Abdominal tenderness improved. Kidney function slightly worse. Continue abx treatment to avoid surgery. Increasing WBC count.  11/4>> Developed SOB and hypoxia overnight requiring 15L Hi-Flo O2. Suspected pulmonary edema d/t fluid overload in setting of impaired kidney function. Diuretics given.  CXR shows progressive worsening of b/l infiltrates(11/4) 11/4>> US guided thoracentesis-571ml's removed, tried esmolol, tried digoxin, HR 130's 11/5>> remains on in afib, resp distress, hypoxia, nephrology consulted 11/6>> remains hypoxic, digoxin toxicity noted +renal failure 11/7>> patient made DNR and transitioned to [place] per patient wishes  11/8>> discomfort, initiated morphine infusion  Micro Data:  10/31>> Blood culture pending (no growth 3 days) 10/31>> Stool PCR - positive for C.Diff    Antimicrobials:  10/31>> metronidazole, vancomycin, cefepime (1x dose)  11/1>> metronidazole, PO vancomycin    Interim History / Subjective:  Patient currently on comfort measures.  Required initiation of morphine infusion due to persistent discomfort.  Objective   Blood pressure (!) 106/57, pulse (!) 124, temperature (!) 97.5 F (36.4 C), temperature source Oral, resp. rate (!) 32, height 5\' 4"  (1.626 m), weight 54.7 kg, SpO2 97 %.    Intake/Output Summary (Last 24 hours) at 01/05/2021 1642 Last data filed at 12/14/2020 1639    Gross per 24 hour  Intake 2500.67 ml  Output --  Net 2500.67 ml       [employer]  01-13-21 1013  Weight: 54.7 kg      REVIEW OF SYSTEMS *Patient refused to answer specific questions stating "she is tired and she wants to be done".   PHYSICAL  EXAMINATION:  GENERAL: Patient appears exhausted on high-flo O2. Tearful and refusing all care and examination. The rest of the physical exam was not obtained due to refusal.     Labs/imaging that I havepersonally reviewed  (right click and "Reselect all SmartList Selections" daily)  10/31>> CTH- There is no acute intracranial hemorrhage or evidence of acute infarction. ASPECT score is 10.  10/31 >> CXR- New airspace opacity in the right suprahilar lung likely due to pneumonia. Radiographic follow-up to resolution is recommended to exclude developing mass. 10/31 >> CT abdomen/pelvis - pancolitis. Left renal subscapular fluid collection, likely hematoma d/t recent renal biopsy.  11/1>> CXR- persistent medial right upper lobe airspace consolidation concerning for pneumonia. Followup PA and lateral chest X-ray is recommended in 3-4 weeks following trial of antibiotic therapy to ensure resolution and exclude underlying malignancy. 11/1>> [place] BLE- No evidence of DVT within either lower extremity. 11/1>> Echo- EF 60-65%, mild LAE (overall normal heart function) 11/2>> KUB - nonobstructive bowel gas pattern. No free intraperitoneal air seen.  11/2>> MR Brain- tiny acute infarct in right frontal lobe. Moderate chronic microvascular ischemic disease.  11/2>> MR T-spine- unremarkable. Mod-sized bilateral pleural effusions.  11/4>> CXR- The appearance the chest is concerning for severely worsening multilobar bilateral pneumonia. Probable trace bilateral pleural effusions. 11/5 CT chest/ABD/PELVIS-b/l infiltrates 11/7 CXR> Unchanged extent multifocal bilateral pulmonary opacities. May represent multifocal pneumonia or ARDS. Pleural effusions, not significantly changed.    Resolved Hospital Problem list       ASSESSMENT AND PLAN   SYNOPSIS: Lauren Bradshaw is a 70 y/o F who presented to [facility] ED for weakness, lethargy, diarrhea, and abdominal pain after recent d/c from rehab for c.diff, Afib, PNA, and ANCA renal  vasculitis. Patient placed in ICU care for hemodynamic instability secondary to hypovolemic septic shock d/t pancolitis with multiorgan failure with resp distress, renal failure, cardiac failure with afib, hypoglycemia, severe leukopenia with severe metabolic acidosis and encephalopathy.  She was transitioned to comfort care on 11/7.  She has had mild to moderate terminal delirium and discomfort.  Initiated morphine infusion with as needed medications such as Haldol and Ativan as needed.  Dated, they are satisfied with care.  That some of the issues noted may have been related to rituximab toxicity though this would have been a diagnosis of exclusion.   11/7: GOALS OF CARE DISCUSSION  Patient refused AM medications on 11/7, she refused  blood draws, etc. She has made her wishes very clear (off precedex and other sedating medications), that she no longer wants medical intervention and would like to be made full comfort care and transferred to hospice when able.  Palliative was consulted, patient code status changed to DNR and full comfort care was initiated.  Husband, daughter-in-law, and son are at bedside and are agreeable to the plan.    Best practice (right click and "Reselect all SmartList Selections" daily)  Diet: Clear liquids Pain/Anxiety/Delirium protocol (if indicated): Comfort Care VAP protocol (if indicated): Not indicated Glucose control:  SSI No Central venous access:  N/A Arterial line:  N/A Foley:  N/A Mobility:  as tol Code Status:  DNR Disposition: Comfort Care     C. Danice Goltz, MD Advanced Bronchoscopy PCCM [facility]    *This note was dictated using voice recognition software/Dragon.  Despite best efforts  to proofread, errors can occur which can change the meaning.  Any change was purely unintentional.

## 2021-02-09 NOTE — Progress Notes (Signed)
SUBJECTIVE: Patient is lethargic   Vitals:   01/16/21 2300 02/04/2021 0600 01/19/2021 0700 01/21/2021 0800  BP:      Pulse:      Resp: (!) 26 (!) 26 (!) 27 (!) 29  Temp:      TempSrc:      SpO2:      Weight:      Height:        Intake/Output Summary (Last 24 hours) at 01/27/2021 0947 Last data filed at 02/03/2021 0537 Gross per 24 hour  Intake --  Output 275 ml  Net -275 ml    LABS: Basic Metabolic Panel: Recent Labs    01/15/21 0412 01/15/21 1453 01/16/21 0447  NA 133*  --  135  K 3.1*  3.1* 4.6 4.0  CL 102  --  103  CO2 23  --  24  GLUCOSE 150*  --  164*  BUN 41*  --  42*  CREATININE 2.01*  --  1.69*  CALCIUM 7.4*  --  8.0*  MG 2.2  --  2.2  PHOS 3.1  --  3.5   Liver Function Tests: Recent Labs    01/15/21 0412 01/16/21 0447  AST 17 10*  ALT 10 10  ALKPHOS 455* 353*  BILITOT 1.2 1.1  PROT 4.0* 4.3*  ALBUMIN 2.1* 2.6*   No results for input(s): LIPASE, AMYLASE in the last 72 hours. CBC: Recent Labs    01/15/21 0412 01/16/21 0447  WBC 3.9* 2.6*  NEUTROABS 2.8 1.9  HGB 9.0* 7.1*  HCT 27.5* 21.8*  MCV 84.4 85.2  PLT 60* 42*   Cardiac Enzymes: No results for input(s): CKTOTAL, CKMB, CKMBINDEX, TROPONINI in the last 72 hours. BNP: Invalid input(s): POCBNP D-Dimer: No results for input(s): DDIMER in the last 72 hours. Hemoglobin A1C: Recent Labs    01/16/21 0447  HGBA1C 5.4   Fasting Lipid Panel: Recent Labs    01/16/21 0447  TRIG 181*   Thyroid Function Tests: No results for input(s): TSH, T4TOTAL, T3FREE, THYROIDAB in the last 72 hours.  Invalid input(s): FREET3 Anemia Panel: No results for input(s): VITAMINB12, FOLATE, FERRITIN, TIBC, IRON, RETICCTPCT in the last 72 hours.   PHYSICAL EXAM General: Well developed, well nourished, in no acute distress HEENT:  Normocephalic and atramatic Neck:  No JVD.  Lungs: Clear bilaterally to auscultation and percussion. Heart: HRRR . Normal S1 and S2 without gallops or murmurs.  Abdomen:  Bowel sounds are positive, abdomen soft and non-tender  Msk:  Back normal, normal gait. Normal strength and tone for age. Extremities: No clubbing, cyanosis or edema.   Neuro: Alert and oriented X 3. Psych:  Good affect, responds appropriately  TELEMETRY: A. fib with rapid rate  ASSESSMENT AND PLAN: A. fib with rapid rate on digoxin with respiratory failure and poor prognosis.  On palliative care.  Active Problems:   Hypovolemic shock (HCC)   Colitis due to Clostridium difficile   Diarrhea    Laurier Nancy, MD, [facility] 01/16/2021 9:47 AM

## 2021-02-09 NOTE — Progress Notes (Signed)
Palliative Care Progress Note, Assessment & Plan   Patient Name: Lauren Bradshaw       Date: 01/10/2021 DOB: 25-Feb-1951  Age: 70 y.o. MRN#: [id number] Attending Physician: Salena Saner, MD Primary Care Physician: Regino Bellow, MD Admit Date: 01/28/21  Reason for Consultation/Follow-up: Terminal Care  Subjective: Patient is lying in bed in no apparent distress with 4 L nasal cannula and morphine GTT in place.  Daughter-in-law is at bedside.  HPI: 70 y.o. female  with past medical history of pneumonia with respiratory failure A. 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 difficile admitted on 01-28-2021 with with weakness, lethargy, diarrhea, and abdominal pain.   While hospitalized, patient noted to have hemodynamic instability, hypovolemic septic shock, multiorgan failure with respiratory distress, renal failure, cardiac failure with A. fib, severe leukopenia, severe metabolic acidosis, encephalopathy, and bilateral infiltrates with suspected pulmonary edema related to poor kidney function.   Dr. Belia Heman initiated a goals of care discussion with family on 11/6.  Palliative medicine team was consulted to discuss goals of care.  Code Status: DNR  Prognosis:  Hours - Days  Discharge Planning: Anticipated Hospital Death  Recommendations/Plan: I rounded on the patient after she was converted from her high flow nasal cannula to 4 L nasal cannula.  Morphine drip is in place.  Patient appears to be resting comfortably in no apparent distress.  No signs of air hunger, respiratory distress, anxiety, agitation, or pain noted.    Daughter-in-law at bedside offered appreciation for her care and asked if she would be moved to a different room.   I shared that since the patient just converted from high flow to regular nasal cannula that we would monitor for a few hours to see how she responds.    Daughter-in-law also asked if patient was going to be transferred to a hospice inpatient facility remain in the hospital.  I again reiterated that we would wait and watch the patient since we have made a transition from such high levels of oxygen to now 4 L of nasal cannula.  The daughter-in-law shared she is fine to stay in the hospital.  She is tearful and recalling the patient's course of hospitalizations and doctors visits to arrive at this point.    She shared the patient's husband is having great difficulty excepting the patient's terminal diagnosis.  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  However the daughter-in-law shares she understands the focus is now on the patient.    I offered therapeutic space and silence for her to share her emotions and thoughts.  I reviewed the signs that we look for that end-of-life is approaching.  However, I was careful to reiterate to the daughter-in-law that the patient's respiratory status is compromised and that death could occur at any moment.  Common signs of end-of-life include hyperextension of the neck, drooping of the nasolabial fold, mottling and cooling of the extremities, and decreased response to physical and verbal stimuli.  However, I highlighted that the patient's current health status may not have any of  these signs occur.  Daughter-in-law was appreciative and understood that death is imminent.  She shared that yesterday the patient was able to speak with lots of family and say her goodbyes.  She is grateful for the morphine drip and says it is made all the difference to keep the patient comfortable.  Questions and concerns were answered.  Family encouraged to call with any future concerns.  I will continue to monitor  this patient throughout her hospitalization.  I do anticipate a hospital death.  Care plan was discussed with bedside RN, patient's daughter in law  Physical Exam Vitals and nursing note reviewed.  HENT:     Head: Normocephalic and atraumatic.     Mouth/Throat:     Mouth: Mucous membranes are moist.  Cardiovascular:     Rate and Rhythm: Normal rate.     Pulses: Normal pulses.  Pulmonary:     Effort: Pulmonary effort is normal.     Comments: 4L [place] Abdominal:     Palpations: Abdomen is soft.  Skin:    General: Skin is warm and dry.  Neurological:     Comments: nonverbal               Total Time 25 minutes Prolonged Time Billed  no   Greater than 50%  of this time was spent counseling and coordinating care related to the above assessment and plan.  Thank you for allowing the Palliative Medicine Team to assist in the care of this patient.  Samara Deist L. Manon Hilding, FNP-BC Palliative Medicine Team Team Phone # [phone]

## 2021-02-09 NOTE — Progress Notes (Signed)
Time of death 1431, confirmed x 2 Rns as per order

## 2021-02-09 NOTE — Progress Notes (Signed)
Radiographic findings may be related to asymmetric pulmonary edema due to atrial fibrillation and decreased atrial kick and volume overload.  Pneumonia not suspected.

## 2021-02-09 DEATH — deceased

## 2021-02-10 LAB — MISC LABCORP TEST (SEND OUT)
